# Patient Record
Sex: Male | Born: 1975 | Race: White | Hispanic: No | Marital: Single | Smoking: Never smoker
Health system: Southern US, Community
[De-identification: ages and names within clinical notes are randomized; demographics above are authoritative.]

## PROBLEM LIST (undated history)

## (undated) DIAGNOSIS — Z9889 Other specified postprocedural states: Secondary | ICD-10-CM

## (undated) DIAGNOSIS — R112 Nausea with vomiting, unspecified: Secondary | ICD-10-CM

## (undated) DIAGNOSIS — M1990 Unspecified osteoarthritis, unspecified site: Secondary | ICD-10-CM

## (undated) DIAGNOSIS — J45909 Unspecified asthma, uncomplicated: Secondary | ICD-10-CM

## (undated) DIAGNOSIS — K4090 Unilateral inguinal hernia, without obstruction or gangrene, not specified as recurrent: Secondary | ICD-10-CM

## (undated) DIAGNOSIS — E739 Lactose intolerance, unspecified: Secondary | ICD-10-CM

## (undated) DIAGNOSIS — L708 Other acne: Secondary | ICD-10-CM

## (undated) DIAGNOSIS — N419 Inflammatory disease of prostate, unspecified: Secondary | ICD-10-CM

## (undated) DIAGNOSIS — K589 Irritable bowel syndrome without diarrhea: Secondary | ICD-10-CM

## (undated) HISTORY — DX: Inflammatory disease of prostate, unspecified: N41.9

## (undated) HISTORY — DX: Unilateral inguinal hernia, without obstruction or gangrene, not specified as recurrent: K40.90

## (undated) HISTORY — DX: Irritable bowel syndrome, unspecified: K58.9

## (undated) HISTORY — PX: COLONOSCOPY: SHX174

## (undated) HISTORY — PX: SKIN TAG REMOVAL: SHX780

## (undated) HISTORY — DX: Lactose intolerance, unspecified: E73.9

## (undated) HISTORY — DX: Other acne: L70.8

## (undated) HISTORY — PX: WISDOM TOOTH EXTRACTION: SHX21

## (undated) HISTORY — DX: Unspecified osteoarthritis, unspecified site: M19.90

---

## 1994-01-02 HISTORY — PX: OTHER SURGICAL HISTORY: SHX169

## 2004-10-15 ENCOUNTER — Emergency Department (HOSPITAL_COMMUNITY): Admission: EM | Admit: 2004-10-15 | Discharge: 2004-10-15 | Payer: Self-pay | Admitting: Family Medicine

## 2007-07-09 ENCOUNTER — Emergency Department (HOSPITAL_COMMUNITY): Admission: EM | Admit: 2007-07-09 | Discharge: 2007-07-09 | Payer: Self-pay | Admitting: Emergency Medicine

## 2008-01-03 HISTORY — PX: HERNIA REPAIR: SHX51

## 2008-03-30 ENCOUNTER — Emergency Department (HOSPITAL_COMMUNITY): Admission: EM | Admit: 2008-03-30 | Discharge: 2008-03-30 | Payer: Self-pay | Admitting: Emergency Medicine

## 2008-04-18 ENCOUNTER — Emergency Department (HOSPITAL_COMMUNITY): Admission: EM | Admit: 2008-04-18 | Discharge: 2008-04-18 | Payer: Self-pay | Admitting: Emergency Medicine

## 2008-04-24 ENCOUNTER — Ambulatory Visit: Payer: Self-pay | Admitting: Gastroenterology

## 2008-04-24 DIAGNOSIS — K4090 Unilateral inguinal hernia, without obstruction or gangrene, not specified as recurrent: Secondary | ICD-10-CM | POA: Insufficient documentation

## 2008-04-24 DIAGNOSIS — R197 Diarrhea, unspecified: Secondary | ICD-10-CM

## 2008-04-24 DIAGNOSIS — R1032 Left lower quadrant pain: Secondary | ICD-10-CM | POA: Insufficient documentation

## 2008-04-24 HISTORY — DX: Unilateral inguinal hernia, without obstruction or gangrene, not specified as recurrent: K40.90

## 2008-04-24 LAB — CONVERTED CEMR LAB
ALT: 26 units/L (ref 0–53)
AST: 24 units/L (ref 0–37)
Alkaline Phosphatase: 68 units/L (ref 39–117)
Basophils Absolute: 0 10*3/uL (ref 0.0–0.1)
HCT: 46.1 % (ref 39.0–52.0)
Lymphs Abs: 1.3 10*3/uL (ref 0.7–4.0)
MCV: 89.2 fL (ref 78.0–100.0)
Monocytes Absolute: 0.4 10*3/uL (ref 0.1–1.0)
Platelets: 214 10*3/uL (ref 150.0–400.0)
RDW: 12 % (ref 11.5–14.6)
Sodium: 143 meq/L (ref 135–145)
TSH: 1.14 microintl units/mL (ref 0.35–5.50)
Total Bilirubin: 1.2 mg/dL (ref 0.3–1.2)
Total Protein: 7.9 g/dL (ref 6.0–8.3)

## 2008-05-13 ENCOUNTER — Ambulatory Visit: Payer: Self-pay | Admitting: Gastroenterology

## 2008-05-13 DIAGNOSIS — M549 Dorsalgia, unspecified: Secondary | ICD-10-CM | POA: Insufficient documentation

## 2008-05-15 ENCOUNTER — Ambulatory Visit: Payer: Self-pay | Admitting: Internal Medicine

## 2008-05-15 LAB — CONVERTED CEMR LAB
Bilirubin Urine: NEGATIVE
Glucose, Urine, Semiquant: NEGATIVE
Ketones, ur: NEGATIVE mg/dL
Ketones, urine, test strip: NEGATIVE
Leukocytes, UA: NEGATIVE
Nitrite: NEGATIVE
Protein, U semiquant: NEGATIVE
Specific Gravity, Urine: <1.005
Specific Gravity, Urine: 1.01 (ref 1.000–1.030)
Urobilinogen, UA: 0.2
WBC Urine, dipstick: NEGATIVE
pH: 5
pH: 5 (ref 5.0–8.0)

## 2008-05-18 ENCOUNTER — Encounter (INDEPENDENT_AMBULATORY_CARE_PROVIDER_SITE_OTHER): Payer: Self-pay | Admitting: *Deleted

## 2008-05-18 LAB — CONVERTED CEMR LAB
Chlamydia, Swab/Urine, PCR: NEGATIVE
GC Probe Amp, Urine: NEGATIVE

## 2008-05-19 ENCOUNTER — Ambulatory Visit: Payer: Self-pay | Admitting: Internal Medicine

## 2008-05-26 ENCOUNTER — Ambulatory Visit: Payer: Self-pay | Admitting: Internal Medicine

## 2008-06-11 ENCOUNTER — Encounter: Payer: Self-pay | Admitting: Internal Medicine

## 2008-06-26 ENCOUNTER — Ambulatory Visit (HOSPITAL_BASED_OUTPATIENT_CLINIC_OR_DEPARTMENT_OTHER): Admission: RE | Admit: 2008-06-26 | Discharge: 2008-06-26 | Payer: Self-pay | Admitting: General Surgery

## 2008-12-08 ENCOUNTER — Ambulatory Visit: Payer: Self-pay | Admitting: Internal Medicine

## 2008-12-08 DIAGNOSIS — L708 Other acne: Secondary | ICD-10-CM

## 2009-02-19 ENCOUNTER — Ambulatory Visit: Payer: Self-pay | Admitting: Internal Medicine

## 2009-02-20 DIAGNOSIS — R091 Pleurisy: Secondary | ICD-10-CM

## 2009-04-24 ENCOUNTER — Emergency Department (HOSPITAL_COMMUNITY): Admission: EM | Admit: 2009-04-24 | Discharge: 2009-04-24 | Payer: Self-pay | Admitting: Family Medicine

## 2009-08-30 ENCOUNTER — Ambulatory Visit: Payer: Self-pay | Admitting: Internal Medicine

## 2009-08-31 DIAGNOSIS — N489 Disorder of penis, unspecified: Secondary | ICD-10-CM | POA: Insufficient documentation

## 2009-12-14 ENCOUNTER — Ambulatory Visit: Payer: Self-pay | Admitting: Internal Medicine

## 2009-12-14 DIAGNOSIS — R05 Cough: Secondary | ICD-10-CM

## 2009-12-14 DIAGNOSIS — B370 Candidal stomatitis: Secondary | ICD-10-CM

## 2010-01-11 ENCOUNTER — Ambulatory Visit
Admission: RE | Admit: 2010-01-11 | Discharge: 2010-01-11 | Payer: Self-pay | Source: Home / Self Care | Attending: Internal Medicine | Admitting: Internal Medicine

## 2010-01-12 ENCOUNTER — Other Ambulatory Visit: Payer: Self-pay | Admitting: Internal Medicine

## 2010-01-12 ENCOUNTER — Ambulatory Visit
Admission: RE | Admit: 2010-01-12 | Discharge: 2010-01-12 | Payer: Self-pay | Source: Home / Self Care | Attending: Internal Medicine | Admitting: Internal Medicine

## 2010-01-12 LAB — URINALYSIS, ROUTINE W REFLEX MICROSCOPIC
Bilirubin Urine: NEGATIVE
Ketones, ur: NEGATIVE
Leukocytes, UA: NEGATIVE
Nitrite: NEGATIVE
Specific Gravity, Urine: >=1.03 (ref 1.000–1.030)
Total Protein, Urine: NEGATIVE
Urine Glucose: NEGATIVE
Urobilinogen, UA: 0.2 (ref 0.0–1.0)
pH: 5.5 (ref 5.0–8.0)

## 2010-01-12 LAB — HEPATIC FUNCTION PANEL
ALT: 25 U/L (ref 0–53)
AST: 21 U/L (ref 0–37)
Albumin: 4 g/dL (ref 3.5–5.2)
Alkaline Phosphatase: 67 U/L (ref 39–117)
Bilirubin, Direct: 0.1 mg/dL (ref 0.0–0.3)
Total Bilirubin: 0.8 mg/dL (ref 0.3–1.2)
Total Protein: 6.9 g/dL (ref 6.0–8.3)

## 2010-01-12 LAB — LIPID PANEL
Cholesterol: 187 mg/dL (ref 0–200)
HDL: 31.9 mg/dL — ABNORMAL LOW (ref >39.00)
LDL Cholesterol: 126 mg/dL — ABNORMAL HIGH (ref 0–99)
Total CHOL/HDL Ratio: 6
Triglycerides: 146 mg/dL (ref 0.0–149.0)
VLDL: 29.2 mg/dL (ref 0.0–40.0)

## 2010-01-12 LAB — BASIC METABOLIC PANEL
BUN: 18 mg/dL (ref 6–23)
CO2: 29 mEq/L (ref 19–32)
Calcium: 9.1 mg/dL (ref 8.4–10.5)
Chloride: 104 mEq/L (ref 96–112)
Creatinine, Ser: 0.9 mg/dL (ref 0.4–1.5)
GFR: 105.79 mL/min (ref >60.00)
Glucose, Bld: 95 mg/dL (ref 70–99)
Potassium: 4 mEq/L (ref 3.5–5.1)
Sodium: 141 mEq/L (ref 135–145)

## 2010-01-12 LAB — CBC WITH DIFFERENTIAL/PLATELET
Basophils Absolute: 0 10*3/uL (ref 0.0–0.1)
Basophils Relative: 0.4 % (ref 0.0–3.0)
Eosinophils Absolute: 0.1 10*3/uL (ref 0.0–0.7)
Eosinophils Relative: 1.9 % (ref 0.0–5.0)
HCT: 42.4 % (ref 39.0–52.0)
Hemoglobin: 14.7 g/dL (ref 13.0–17.0)
Lymphocytes Relative: 28.9 % (ref 12.0–46.0)
Lymphs Abs: 1.6 10*3/uL (ref 0.7–4.0)
MCHC: 34.6 g/dL (ref 30.0–36.0)
MCV: 90.9 fl (ref 78.0–100.0)
Monocytes Absolute: 0.6 10*3/uL (ref 0.1–1.0)
Monocytes Relative: 10.3 % (ref 3.0–12.0)
Neutro Abs: 3.2 10*3/uL (ref 1.4–7.7)
Neutrophils Relative %: 58.5 % (ref 43.0–77.0)
Platelets: 196 10*3/uL (ref 150.0–400.0)
RBC: 4.66 Mil/uL (ref 4.22–5.81)
RDW: 13.3 % (ref 11.5–14.6)
WBC: 5.4 10*3/uL (ref 4.5–10.5)

## 2010-01-12 LAB — TSH: TSH: 1.11 u[IU]/mL (ref 0.35–5.50)

## 2010-01-24 ENCOUNTER — Encounter: Payer: Self-pay | Admitting: Internal Medicine

## 2010-02-03 NOTE — Assessment & Plan Note (Signed)
Summary: cpx/bcbs/#/cd   Vital Signs:  Patient profile:   35 year old male Height:      73 inches (185.42 cm) Weight:      167.0 pounds (75.91 kg) O2 Sat:      98 % on Room air Temp:     97.9 degrees F (36.61 degrees C) oral Pulse rate:   67 / minute BP sitting:   112 / 76  (left arm) Cuff size:   regular  Vitals Entered By: Tomma Lightning RMA (January 11, 2010 2:11 PM)  O2 Flow:  Room air CC: CPX Is Patient Diabetic? No Pain Assessment Patient in pain? no        Primary Care Provider:  Rowe Clack MD  CC:  CPX.  History of Present Illness: patient is here today for annual physical. Patient feels well and has no complaints.   Preventive Screening-Counseling & Management  Alcohol-Tobacco     Alcohol drinks/day: <1     Alcohol Counseling: not indicated; use of alcohol is not excessive or problematic     Smoking Status: quit     Tobacco Counseling: not to resume use of tobacco products  Caffeine-Diet-Exercise     Does Patient Exercise: yes     Exercise Counseling: to improve exercise regimen     Depression Counseling: not indicated; screening negative for depression  Safety-Violence-Falls     Seat Belt Counseling: not indicated; patient wears seat belts     Helmet Counseling: not indicated; patient wears helmet when riding bicycle/motocycle     Violence Counseling: not indicated; no violence risk noted     Fall Risk Counseling: not indicated; no significant falls noted  Clinical Review Panels:  Immunizations   Last Tetanus Booster:  Historical (01/02/2006)  CBC   WBC:  4.8 (04/24/2008)   RBC:  5.17 (04/24/2008)   Hgb:  16.2 (04/24/2008)   Hct:  46.1 (04/24/2008)   Platelets:  214.0 (04/24/2008)   MCV  89.2 (04/24/2008)   MCHC  35.1 (04/24/2008)   RDW  12.0 (04/24/2008)   PMN:  62.8 (04/24/2008)   Lymphs:  26.5 (04/24/2008)   Monos:  8.4 (04/24/2008)   Eosinophils:  2.0 (04/24/2008)   Basophil:  0.3 (04/24/2008)  Complete Metabolic Panel  Glucose:  109 (04/24/2008)   Sodium:  143 (04/24/2008)   Potassium:  4.2 (04/24/2008)   Chloride:  107 (04/24/2008)   CO2:  31 (04/24/2008)   BUN:  16 (04/24/2008)   Creatinine:  1.0 (04/24/2008)   Albumin:  4.7 (04/24/2008)   Total Protein:  7.9 (04/24/2008)   Calcium:  9.7 (04/24/2008)   Total Bili:  1.2 (04/24/2008)   Alk Phos:  68 (04/24/2008)   SGPT (ALT):  26 (04/24/2008)   SGOT (AST):  24 (04/24/2008)   Current Medications (verified): 1)  Multivitamins  Tabs (Multiple Vitamin) .... Take 1 Tablet By Mouth Once A Day 2)  Advil 200 Mg Tabs (Ibuprofen) .Marland Kitchen.. 1-2 By Mouth Every 4-6 Hours As Needed For Pain  Allergies (verified): No Known Drug Allergies  Past History:  Past medical, surgical, family and social histories (including risk factors) reviewed, and no changes noted (except as noted below).  Past Medical History: Pneumothorax, spontaneous -age 7 Right inguinal hernia s/p repair summer 2010 Lactose intolerance    Past Surgical History: Reviewed history from 12/08/2008 and no changes required. surgery for spont pneumothorax-Right lung (1996) Inguinal herniorrhaphy, right (2010) - b. thompson  Family History: Reviewed history from 05/15/2008 and no changes required. No FH of Colon  Cancer Family History of Colon Polyps: Father Stroke (grandparents)  Social History: Reviewed history from 12/14/2009 and no changes required. 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 I have reviewed all other systems and they were negative.   Physical Exam  General:  alert, well-developed, well-nourished, and cooperative to examination.    Head:  Normocephalic and atraumatic without obvious  abnormalities. No apparent alopecia or balding. Eyes:  vision grossly intact; pupils equal, round and reactive to light.  conjunctiva and lids normal.    Ears:  normal pinnae bilaterally, without erythema, swelling, or tenderness to palpation. TMs clear, without effusion, or cerumen impaction. Hearing grossly normal bilaterally  Mouth:  teeth and gums in good repair; mucous membranes moist, without lesions or ulcers. oropharynx clear without exudate, no erythema.  Neck:  supple, full ROM, no masses, no thyromegaly; no thyroid nodules or tenderness. no JVD or carotid bruits.   Lungs:  normal respiratory effort, no intercostal retractions or use of accessory muscles; normal breath sounds bilaterally - no crackles and no wheezes.    Heart:  normal rate, regular rhythm, no murmur, and no rub. BLE without edema.  Abdomen:  soft, non-tender, normal bowel sounds, no distention; no masses and no appreciable hepatomegaly or splenomegaly.   Rectal:  defer Genitalia:  defer Prostate:  defer Msk:  No deformity or scoliosis noted of thoracic or lumbar spine.   Neurologic:  alert & oriented X3 and cranial nerves II-XII symetrically intact.  strength normal in all extremities, sensation intact to light touch, and gait normal. speech fluent without dysarthria or aphasia; follows commands with good comprehension.  Skin:  no rashes, vesicles, ulcers, or erythema. No nodules or irregularity to palpation.  Psych:  Oriented X3, memory intact for recent and remote, normally interactive, good eye contact, not anxious appearing, not depressed appearing, and not agitated.      Impression & Recommendations:  Problem # 1:  PREVENTIVE HEALTH CARE (ICD-V70.0) Patient has been counseled on age-appropriate routine health concerns for screening and prevention. These are reviewed and up-to-date. Immunizations are up-to-date or declined. Labs ordered and will be reviewed.  Orders: TLB-Lipid Panel (80061-LIPID) TLB-BMP (Basic  Metabolic Panel-BMET) (39767-HALPFXT) TLB-CBC Platelet - w/Differential (85025-CBCD) TLB-Hepatic/Liver Function Pnl (80076-HEPATIC) TLB-TSH (Thyroid Stimulating Hormone) (84443-TSH) TLB-Udip w/ Micro (81001-URINE)  Problem # 2:  OTHER URINARY PROBLEMS (ICD-V47.4)  Orders: Urology Referral (Urology)  dysuria and hesitation, described, especially after ejaculation/erection prior eval neg for STD and normal UA -  recheck UA with cpx labs and refer to uro now  Complete Medication List: 1)  Multivitamins Tabs (Multiple vitamin) .... Take 1 tablet by mouth once a day 2)  Advil 200 Mg Tabs (Ibuprofen) .Marland Kitchen.. 1-2 by mouth every 4-6 hours as needed for pain  Patient Instructions: 1)  it was good to see you today. 2)  return for lab only when fasting (nothing to eat/drink for 8 hours before lab draw) - your results will be mailed to you after review in 48-72 hours from the time of test completion; if any changes need to be made or there are abnormal results, you will be contacted directly.  3)  try Align or other probiotic for GI digestion  symptoms as discussed 4)  we'll make referral to urology. Our office will contact you regarding this appointment once made.  5)  Please schedule a follow-up appointment annually for medical physical, call sooner if problems.    Orders Added: 1)  Est. Patient 18-39 years [48830] 2)  Urology Referral [Urology] 3)  TLB-Lipid Panel [80061-LIPID] 4)  TLB-BMP (Basic Metabolic Panel-BMET) [14159-RHZJGJG] 5)  TLB-CBC Platelet - w/Differential [85025-CBCD] 6)  TLB-Hepatic/Liver Function Pnl [80076-HEPATIC] 7)  TLB-TSH (Thyroid Stimulating Hormone) [84443-TSH] 8)  TLB-Udip w/ Micro [81001-URINE]   Immunization History:  Tetanus/Td Immunization History:    Tetanus/Td:  historical (01/02/2006)   Immunization History:  Tetanus/Td Immunization History:    Tetanus/Td:  Historical (01/02/2006)

## 2010-02-03 NOTE — Assessment & Plan Note (Signed)
Summary: X 2 WKS NAUSEA-VOMIT'G WK AGO-CONGESTION  STC   Vital Signs:  Patient profile:   35 year old male Height:      73 inches (185.42 cm) Weight:      169.4 pounds (77 kg) BMI:     22.43 O2 Sat:      97 % on Room air Temp:     98.2 degrees F (36.78 degrees C) oral Pulse rate:   97 / minute BP sitting:   122 / 78  (left arm) Cuff size:   regular  Vitals Entered By: Tomma Lightning (February 19, 2009 2:49 PM)  O2 Flow:  Room air CC: congestion, nausea & vomitting x's 1 week Is Patient Diabetic? No Pain Assessment Patient in pain? no        Primary Care Provider:  Rowe Clack MD  CC:  congestion and nausea & vomitting x's 1 week.  History of Present Illness: here today with complaint of chest congestion. onset of symptoms was 1 week ago. course has been sudden onset and now occurs in intermittent waxing/waning pattern. problem precipitated by long plane travel from [place] to [place] . symptom characterized as rattling in lungs and dry cough - problem associated with pain in lungs on deep inspiration L>R but not associated with  LE swelling, fever, green sputum or SOB. symptoms improved by nothing - not taking anything OTC "because i don't want to take the wrong thing". symptoms worsened with coughing - esp at night. +prior hx of same symptoms when experienced spont pneumothx years ago and wants to be checked.  also had N/V when symptoms began 1 week ago - but none in last several days  Current Medications (verified): 1)  Multivitamins  Tabs (Multiple Vitamin) .... Take 1 Tablet By Mouth Once A Day 2)  Advil 200 Mg Tabs (Ibuprofen) .Marland Kitchen.. 1-2 By Mouth Every 4-6 Hours As Needed For Pain  Allergies (verified): No Known Drug Allergies  Past History:  Past Medical History: Pneumothorax, spontaneous -age 65 Right inguinal hernia s/p repair Lactose intolerance  Review of Systems       The patient complains of chest pain and prolonged cough.  The patient denies fever,  hoarseness, dyspnea on exertion, peripheral edema, and headaches.    Physical Exam  General:  alert, well-developed, well-nourished, and cooperative to examination.    Eyes:  vision grossly intact; pupils equal, round and reactive to light.  conjunctiva and lids normal.    Ears:  normal pinnae bilaterally, without erythema, swelling, or tenderness to palpation. TMs clear, without effusion, or cerumen impaction. Hearing grossly normal bilaterally  Mouth:  teeth and gums in good repair; mucous membranes moist, without lesions or ulcers. oropharynx clear without exudate, no erythema.  Neck:  supple, full ROM, no masses, no thyromegaly; no thyroid nodules or tenderness. no JVD or carotid bruits.   Lungs:  normal respiratory effort, no intercostal retractions or use of accessory muscles; normal breath sounds bilaterally - no crackles and no wheezes.    Heart:  normal rate, regular rhythm, no murmur, and no rub. BLE without edema.    Impression & Recommendations:  Problem # 1:  PLEURISY (ICD-511.0)  lung exam clear - VSS and normal O2 - reassurance provided there are no signs of recurrent pneumothoarx - given cough and chest rattle with wax and wane symptoms of congestion over past week, pt provided with Zpack rx to use "in case" - rec use of symptoms tx OTC - options provided to call if symptoms  worse as needed   Complete Medication List: 1)  Multivitamins Tabs (Multiple vitamin) .... Take 1 tablet by mouth once a day 2)  Advil 200 Mg Tabs (Ibuprofen) .Marland Kitchen.. 1-2 by mouth every 4-6 hours as needed for pain 3)  Azithromycin 250 Mg Tabs (Azithromycin) .... 2 tabs by mouth today, then 1 by mouth daily starting tomorrow  Patient Instructions: 1)  it was good to see you today. 2)  zpak antibiotics to use if your symptoms become worse-- fever, sputum, trouble breathing, etc - 3)  in meanwhile, ok to try Advil for pain, Mucinex for sputum and cough, and Tylenol PM or Nyquil for sleep 4)  Please  schedule a follow-up appointment as needed. Prescriptions: AZITHROMYCIN 250 MG TABS (AZITHROMYCIN) 2 tabs by mouth today, then 1 by mouth daily starting tomorrow  #6 x 0   Entered and Authorized by:   Rowe Clack MD   Signed by:   Rowe Clack MD on 02/19/2009   Method used:   Print then Give to Patient   RxID:   [id number]

## 2010-02-03 NOTE — Assessment & Plan Note (Signed)
Summary: red dot on penis getting larger/cd   Vital Signs:  Patient profile:   35 year old male Height:      73 inches (185.42 cm) Weight:      164.8 pounds (74.91 kg) O2 Sat:      98 % on Room air Temp:     98.2 degrees F (36.78 degrees C) oral Pulse rate:   60 / minute BP sitting:   120 / 78  (left arm) Cuff size:   regular  Vitals Entered By: Tomma Lightning RMA (August 30, 2009 3:06 PM)  O2 Flow:  Room air CC: Red bump on penis. Pt states started out as a little bump past 3 weeks getting bigger Is Patient Diabetic? No Pain Assessment Patient in pain? no        Primary Care Aneisa Karren:  Rowe Clack MD  CC:  Red bump on penis. Pt states started out as a little bump past 3 weeks getting bigger.  History of Present Illness: here with c/o penile lesion - onset 3 weeks ago - course has been progressive growth in size, now with smaller 2nd lesion not a/w pain or urethral discharge, no dysuria or hematuria +hx same approx 1 year ago - seen by uro in [place] and treated with lotrisone - resolved not sexually active >3 months, no new partners - no hx STD and denies known risk/exposure no constitutional symptoms: no HA, fever or rash - no painful lymphadenopathy  Current Medications (verified): 1)  Multivitamins  Tabs (Multiple Vitamin) .... Take 1 Tablet By Mouth Once A Day 2)  Advil 200 Mg Tabs (Ibuprofen) .Marland Kitchen.. 1-2 By Mouth Every 4-6 Hours As Needed For Pain  Allergies (verified): No Known Drug Allergies  Past History:  Past Medical History: Pneumothorax, spontaneous -age 38 Right inguinal hernia s/p repair Lactose intolerance  Review of Systems       The patient complains of fever and abdominal pain.  The patient denies hematuria, incontinence, enlarged lymph nodes, and testicular masses.    Physical Exam  General:  alert, well-developed, well-nourished, and cooperative to examination.    Lungs:  normal respiratory effort, no intercostal retractions or use  of accessory muscles; normal breath sounds bilaterally - no crackles and no wheezes.    Heart:  normal rate, regular rhythm, no murmur, and no rub. BLE without edema.  Genitalia:  1cm x 0.5 cm shallow smooth ulceration of superior edge of glans - bright red but dry, nonpainful, and well circumscribed lesion with smaller satelite lesion near primary lesion: red papule 75m round - no urethral dischage - testicles normal Inguinal Nodes:  no inguinal adenopathy.     Impression & Recommendations:  Problem # 1:  PENILE LESION (ICD-236.6)  hx recurrent painless ulceration - systemically nontoxic, no recent sexual partners, no LAD, no urethritis symptoms  prev tx with lotrisone - quick resolution per pt- satellite lesion suggests possible fungal component so will treat again for same - though exam/hx not classic- however pt encouraged to return if not resolving to consider further eval for syphillis, HSV or other infx - may also consider uro eval  Orders: Prescription Created Electronically ([phone]  Complete Medication List: 1)  Multivitamins Tabs (Multiple vitamin) .... Take 1 tablet by mouth once a day 2)  Advil 200 Mg Tabs (Ibuprofen) ..Marland Kitchen. 1-2 by mouth every 4-6 hours as needed for pain 3)  Clotrimazole-betamethasone 1-0.05 % Crea (Clotrimazole-betamethasone) .... Apply to affected skin three times a day - four times a day  x 7-10days, then as needed  Patient Instructions: 1)  it was good to see you today. 2)  use lotrisone cream as discussed - your prescription has been electronically submitted to your pharmacy. Please take as directed. Contact our office if you believe you're having problems with the medication(s). 3)  if not improved in 7-10days, or if worse, call and will make referral to urologist as needed  Prescriptions: CLOTRIMAZOLE-BETAMETHASONE 1-0.05 % CREA (CLOTRIMAZOLE-BETAMETHASONE) apply to affected skin three times a day - four times a day x 7-10days, then as needed  #1 x 1    Entered and Authorized by:   Rowe Clack MD   Signed by:   Rowe Clack MD on 08/30/2009   Method used:   Electronically to        [facility]  [phone]* (retail)       [place], [place]  [address]       Ph: [phone] or [phone]       Fax: [phone]   RxID:   [id number]

## 2010-02-03 NOTE — Assessment & Plan Note (Signed)
Summary: lingering cold/feels better, but does not think completely go...   Vital Signs:  Patient profile:   35 year old male Height:      73 inches (185.42 cm) Weight:      162.8 pounds (74 kg) BMI:     21.56 O2 Sat:      98 % on Room air Temp:     98.2 degrees F (36.78 degrees C) oral Pulse rate:   71 / minute BP sitting:   122 / 80  (left arm) Cuff size:   regular  Vitals Entered By: Tomma Lightning RMA (December 14, 2009 8:34 AM)  O2 Flow:  Room air CC: Cold sxs, URI symptoms Is Patient Diabetic? No Pain Assessment Patient in pain? no        Primary Care Noah Pelaez:  Rowe Clack MD  CC:  Cold sxs and URI symptoms.  History of Present Illness:  URI Symptoms      This is a 35 year old man who presents with URI symptoms.  The symptoms began 3 weeks ago.  The severity is described as moderate.  symptoms wax and wane over past few weeks.  The patient reports nasal congestion, sore throat, and dry cough, but denies clear nasal discharge, purulent nasal discharge, earache, and sick contacts.  Associated symptoms include dyspnea and right side pleurisy.  The patient denies fever, stiff neck, wheezing, rash, vomiting, and diarrhea.  The patient denies itchy watery eyes, itchy throat, sneezing, seasonal symptoms, response to antihistamine, headache, muscle aches, and severe fatigue.  Risk factors for Strep sinusitis include double sickening.  The patient denies the following risk factors for Strep sinusitis: tooth pain, Strep exposure, and tender adenopathy.    Clinical Review Panels:  CBC   WBC:  4.8 (04/24/2008)   RBC:  5.17 (04/24/2008)   Hgb:  16.2 (04/24/2008)   Hct:  46.1 (04/24/2008)   Platelets:  214.0 (04/24/2008)   MCV  89.2 (04/24/2008)   MCHC  35.1 (04/24/2008)   RDW  12.0 (04/24/2008)   PMN:  62.8 (04/24/2008)   Lymphs:  26.5 (04/24/2008)   Monos:  8.4 (04/24/2008)   Eosinophils:  2.0 (04/24/2008)   Basophil:  0.3 (04/24/2008)  Complete Metabolic Panel  Glucose:  109 (04/24/2008)   Sodium:  143 (04/24/2008)   Potassium:  4.2 (04/24/2008)   Chloride:  107 (04/24/2008)   CO2:  31 (04/24/2008)   BUN:  16 (04/24/2008)   Creatinine:  1.0 (04/24/2008)   Albumin:  4.7 (04/24/2008)   Total Protein:  7.9 (04/24/2008)   Calcium:  9.7 (04/24/2008)   Total Bili:  1.2 (04/24/2008)   Alk Phos:  68 (04/24/2008)   SGPT (ALT):  26 (04/24/2008)   SGOT (AST):  24 (04/24/2008)   Current Medications (verified): 1)  Multivitamins  Tabs (Multiple Vitamin) .... Take 1 Tablet By Mouth Once A Day 2)  Advil 200 Mg Tabs (Ibuprofen) .Marland Kitchen.. 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    Physical Exam  General:  alert, well-developed, well-nourished, and cooperative to examination.    Head:  Normocephalic and atraumatic without obvious  abnormalities. No apparent alopecia or balding. Eyes:  vision grossly intact; pupils equal, round and reactive to light.  conjunctiva and lids normal.    Ears:  normal pinnae bilaterally, without erythema, swelling, or tenderness to palpation. TMs clear, without effusion, or cerumen impaction. Hearing grossly normal bilaterally  Mouth:  teeth and gums in good repair; mucous membranes moist, without lesions or ulcers. oropharynx clear without exudate, no erythema. +thrush on tongue Lungs:  normal respiratory effort, no intercostal retractions or use of accessory muscles; normal breath sounds bilaterally - no crackles and no wheezes.    Heart:  normal rate, regular rhythm, no murmur, and no rub. BLE  without edema.    Impression & Recommendations:  Problem # 1:  COUGH (ICD-786.2) exam benign for bact infx - suspect pleurisy check cxr now, esp as pt with concern for prior spont ptx age 70  if no asd, bronchial change or other abn, plan tx NSAIDs x 5 d and antitussive - same explained to pt Orders: T-2 View CXR (71020TC)  Problem # 2:  PLEURISY (ICD-511.0)  Orders: T-2 View CXR (71020TC)  lung exam clear - VSS and normal O2 - see above for cough reassurance provided there are no signs of recurrent pneumothoarx - rec use of symptoms tx OTC - options provided to call if symptoms worse as needed   Problem # 3:  THRUSH (ICD-112.0)  nystatin swish and gargle - erx done  Orders: Prescription Created Electronically [phone]  Complete Medication List: 1)  Multivitamins Tabs (Multiple vitamin) .... Take 1 tablet by mouth once a day 2)  Advil 200 Mg Tabs (Ibuprofen) .Marland Kitchen.. 1-2 by mouth every 4-6 hours as needed for pain 3)  Nystatin 100000 Unit/ml Susp (Nystatin) .Marland Kitchen.. 1 tbsp swish and gargle three times a day x 5 days, then as needed  Patient Instructions: 1)  it was good to see you today. 2)  mouthrinse for mouth symptoms as discussed - your prescriptions have been electronically submitted to your pharmacy. Please take as directed. Contact our office if you believe you're having problems with the medication(s). 3)  chest xray ordered today - your results will be  called to you after review today- further treatment plans will be  determined after review of these results 4)  Please keep scheduled follow-up appointment in Jan for physical and labs, call sooner if problems.  Prescriptions: NYSTATIN 100000 UNIT/ML SUSP (NYSTATIN) 1 tbsp swish and gargle three times a day x 5 days, then as needed  #8 oz x 1   Entered and Authorized by:   Rowe Clack MD   Signed by:   Rowe Clack MD on 12/14/2009   Method used:   Electronically to        [facility]  [phone]*  (retail)       [place], [place]  [address]       Ph: [phone] or [phone]       Fax: [phone]   RxID:   [id number]    Orders Added: 1)  T-2 View CXR [71020TC] 2)  Est. Patient Level IV [85909] 3)  Prescription Created Electronically [phone]

## 2010-02-09 NOTE — Consult Note (Signed)
Summary: [facility]   Imported By: Phillis Knack 01/31/2010 09:35:54  _____________________________________________________________________  External Attachment:    Type:   Image     Comment:   External Document

## 2010-04-13 LAB — POCT URINALYSIS DIP (DEVICE)
Bilirubin Urine: NEGATIVE
Nitrite: NEGATIVE
Protein, ur: NEGATIVE mg/dL
Urobilinogen, UA: 0.2 mg/dL (ref 0.0–1.0)
pH: 7.5 (ref 5.0–8.0)

## 2010-05-17 NOTE — Op Note (Signed)
Curtis [name], Curtis [name]              ACCOUNT NO.:  [id number]   MEDICAL RECORD NO.:  [id number]          PATIENT TYPE:  AMB   LOCATION:  [place]                          FACILITY:  [facility]   PHYSICIAN:  Curtis [name]. Curtis [name], M.D.DATE OF BIRTH:  [birth year]   DATE OF PROCEDURE:  06/26/2008  DATE OF DISCHARGE:                               OPERATIVE REPORT   PREOPERATIVE DIAGNOSIS:  Right inguinal hernia.   POSTOPERATIVE DIAGNOSIS:  Right inguinal hernia.   PROCEDURE:  Repair of right inguinal hernia with mesh.   SURGEON:  Curtis [name]. Curtis Silos, MD   ANESTHESIA:  General with laryngeal mask airway.   HISTORY OF PRESENT ILLNESS:  [name] is a 35 year old white male who  I evaluated in the office for a symptomatic right inguinal hernia.  He  first noticed that about 2 years ago, but it has been getting larger and  intermittently painful.  He presents for elective repair.   PROCEDURE IN DETAIL:  Informed consent was obtained.  The patient was  identified in the preop holding area.  His site was marked.  He received  intravenous antibiotics.  He was brought to the operating room.  General  anesthesia with laryngeal mask airway was administered by the anesthesia  staff.  His abdomen and groins were prepped and draped in sterile  fashion.  Time-out procedure was done.  Area below his right anterior-  superior iliac spine was infiltrated with 0.25% Marcaine, we also  injected this along the planned line of the incision.  Right inguinal  incision was made.  Subcutaneous tissues were dissected down through  Scarpa's fascia revealing the external oblique fascia.  This was  somewhat attenuated medially due to the large hernia.  The external  oblique fascia was divided laterally and then this division was  continued down through the external ring.  The superior leaflet of the  external oblique fascia was dissected off of the transversalis and the  inferior leaflet was dissected down revealing the  shelving edge of the  inguinal ligament.  Cord structures were encircled with Penrose drain.  The floor was inspected.  There was no direct inguinal hernia, however,  the floor was very weak.  The cord was then dissected revealing a very  large indirect hernia sac.  This extended quite a ways medially.  The  contents were reduced manually.  The sac was then dissected free from  the cord structures all the way back down to the internal ring.  The sac  was then opened, it had contained only omentum.  There were a few small  adhesions down near the base and these were taken down.  Excellent  hemostasis was ensured, the omentum was reduced back into the abdomen.  The sac was then divided and high ligated with pursestring 2-0 Vicryl  suture.  The sac was discarded, the head appeared normal.  The hernia  repair was then completed with a keyhole polypropylene mesh cut custom  to size.  The mesh was tacked to the tissues over the pubic tubercle  medially and with 0 Prolene suture, it was then secured to  the shelving  edge of the inguinal ligament with a running 0 Prolene suture.  The mesh  was then tacked superiorly along the tissues to the overlying the pubic  tubercle again with interrupted 0 Prolene sutures.  This also extended  with interrupted 0 Prolene sutures, tacking the mesh along the  transversalis superiorly and extending out laterally.  The 2 leaflets of  the mesh are rejoined behind the cord structures and tacked together  into the underlying musculature with 0 Prolene in interrupted fashion.  The tips of the mesh were tucked laterally under the external oblique.  Please note that during the initial dissection, the inguinal branch of  his ilioinguinal nerve was noted to be in danger becoming entrapped by  the mesh, so it was divided and removed.  The mesh was then copiously  irrigated.  Meticulous hemostasis was ensured.  The aperture in the mesh  admitted the tip of a fifth digit  next to the cord structures.  Cord  structures remained viable and nonedematous.  The external oblique  fascia was then closed with a running 2-0 Vicryl and subcuticular  primary running 2-0 Vicryl suture.  Subcutaneous tissues were irrigated.  Some additional local anesthetic was injected.  Scarpa's fascia was  closed with interrupted 3-0 Vicryl sutures and the skin was closed with  a running 4-0 Monocryl subcuticular stitch.  Followed by Dermabond,  sponge, needle, and instruments were all correct.  The patient's right  testicle was returned to anatomic position at the completion of the  case.  The patient tolerated the procedure well without apparent  complication and was taken to recovery room in stable condition.      Curtis [name] Curtis [name], M.D.  Electronically Signed     BET/MEDQ  D:  06/26/2008  T:  06/27/2008  Job:  [id number]   cc:   Mateo Flow A. Asa Lente, MD

## 2010-06-16 ENCOUNTER — Encounter: Payer: Self-pay | Admitting: Internal Medicine

## 2010-06-18 ENCOUNTER — Ambulatory Visit (INDEPENDENT_AMBULATORY_CARE_PROVIDER_SITE_OTHER): Payer: BC Managed Care – PPO | Admitting: Family Medicine

## 2010-06-18 ENCOUNTER — Encounter: Payer: Self-pay | Admitting: Family Medicine

## 2010-06-18 VITALS — BP 112/70 | HR 68 | Temp 98.2°F | Wt 160.0 lb

## 2010-06-18 DIAGNOSIS — R197 Diarrhea, unspecified: Secondary | ICD-10-CM

## 2010-06-18 MED ORDER — METRONIDAZOLE 500 MG PO TABS: 500.0000 mg | ORAL_TABLET | Freq: Two times a day (BID) | ORAL | Status: DC

## 2010-06-18 MED ORDER — CLOTRIMAZOLE-BETAMETHASONE 1-0.05 % EX CREA: TOPICAL_CREAM | Freq: Two times a day (BID) | CUTANEOUS | Status: DC

## 2010-06-18 NOTE — Assessment & Plan Note (Signed)
Acute for 1 week - after taking antibiotics  Strongly suspect c diff given mucous in stool and persistant symptoms  Cannot do testing today in sat clinic -- but will empirically tx with flagyl Disc hydration If not improved by mid week - follow up with primary If worse - call - esp if abd pain or fever

## 2010-06-18 NOTE — Progress Notes (Signed)
Subjective:    Patient ID: Curtis [name], male    DOB: May 17, 1975, 35 y.o.   MRN: [id number]  HPI Here for stomach issues  Sunday started with some mild nausea and diarrhea  Nausea went away without vomiting  Appetite fine  Diarrhea - has worsened and is persistant  No fever  No abd pain -- but cramp before bm  Was tired all day- slept much of the day  Woke up this am  Has been almost a week   Sun - Monday - very watery stools  Since then a bit more solid but loose  No blood in stool Tried some pepto- no imp  Gassy and some mucous leakage with bad odor   Stools are slowing down more now  Is having bm - about every 4-6 hours - depending on when he eats  No new stress   Hx of constipation -- was once given samples of align  Has been trying for 4 days- ? If helping or hurting   Did just have a dental implant put in and was put on amoxicillin -- finished that on 2nd of June   No out of country travel or camping  Has city water with a pur filter   Also needs a refil of lotrisone for [employer] itch/ penile rash This comes and goes and he ran out of it  Is itchy  Usually improves quickly  Patient Active Problem List  Diagnoses  . PENILE LESION  . PLEURISY  . INGUINAL HERNIA, RIGHT  . ACNE, CYSTIC  . BACK PAIN  . DIARRHEA  . ABDOMINAL PAIN, LEFT LOWER QUADRANT  . THRUSH  . COUGH   Past Medical History  Diagnosis Date  . Abdominal pain, left lower quadrant 04/24/2008  . ACNE, CYSTIC 12/08/2008  . BACK PAIN 05/13/2008  . Cough 12/14/2009  . Diarrhea 04/24/2008  . INGUINAL HERNIA, RIGHT 04/24/2008  . PENILE LESION 08/31/2009  . PLEURISY 02/20/2009  . Lactose intolerance   . Pneumonia    Past Surgical History  Procedure Date  . Surgery for spont pneumothorax 1996    right lung  . Hernia repair 2010    right, Dr. Grandville Silos   History  Substance Use Topics  . Smoking status: Never Smoker   . 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  Alcohol Use: Yes     weekends   Family History  Problem Relation Age of Onset  . Stroke Other     grandparent   No Known Allergies Current Outpatient Prescriptions on File Prior to Visit  Medication Sig Dispense Refill  . ibuprofen (ADVIL,MOTRIN) 200 MG tablet Take 200 mg by mouth every 6 (six) hours as needed.        . Multiple Vitamin (MULTIVITAMIN) tablet Take 1 tablet by mouth daily.              Review of Systems Review of Systems  Constitutional: Negative for fever, appetite change, fatigue and unexpected weight change.  Eyes: Negative for pain and visual disturbance.  Respiratory: Negative for cough and shortness of breath.   Cardiovascular: Negative.  for cp or palpitations Gastrointestinal: Negative for nausea, abd pain - pos for diarrhea/ occ cramping- no blood in stool Genitourinary: Negative for urgency and frequency.  Skin: Negative for pallor. pos for groin rash recurrent  Neurological: Negative for weakness, light-headedness, numbness and headaches.  Hematological: Negative for adenopathy. Does not bruise/bleed easily.  Psychiatric/Behavioral: Negative for dysphoric mood.  The patient is not nervous/anxious.         Objective:   Physical Exam  Constitutional: He appears well-developed and well-nourished. No distress.  HENT:  Head: Normocephalic and atraumatic.  Mouth/Throat: Oropharynx is clear and moist.  Eyes: Conjunctivae and EOM are normal. Pupils are equal, round, and reactive to light. No scleral icterus.  Neck: Normal range of motion. Neck supple. No thyromegaly present.  Cardiovascular: Normal rate, regular rhythm and normal heart sounds.   Pulmonary/Chest: Effort normal and breath sounds normal. No respiratory distress.  Abdominal: Soft. He exhibits no distension and no mass. There is no tenderness.       bs are mildly hyperactive but not high pitched   Musculoskeletal: He exhibits no tenderness.  Lymphadenopathy:    He has no cervical adenopathy.   Neurological: He is alert. He has normal reflexes.  Skin: Skin is warm and dry. No rash noted. No erythema. No pallor.       Brisk capillary refil time  Psychiatric: He has a normal mood and affect.          Assessment & Plan:

## 2010-06-18 NOTE — Patient Instructions (Signed)
I think you may have a C difficile colitis from recent antibiotics  Take the flagyl as directed  Keep hydrated I also refilled lotrisone for rash  If not improved by mid next week or if worse - follow up with your regular doctor

## 2010-06-22 ENCOUNTER — Encounter: Payer: Self-pay | Admitting: Internal Medicine

## 2010-06-22 ENCOUNTER — Ambulatory Visit (INDEPENDENT_AMBULATORY_CARE_PROVIDER_SITE_OTHER): Payer: BC Managed Care – PPO | Admitting: Internal Medicine

## 2010-06-22 DIAGNOSIS — R197 Diarrhea, unspecified: Secondary | ICD-10-CM

## 2010-06-22 DIAGNOSIS — R110 Nausea: Secondary | ICD-10-CM

## 2010-06-22 MED ORDER — PROMETHAZINE HCL 25 MG PO TABS: 25.0000 mg | ORAL_TABLET | Freq: Four times a day (QID) | ORAL | Status: AC | PRN

## 2010-06-22 NOTE — Patient Instructions (Signed)
It was good to see you today. Stop metronidazole antibiotics and use promethazine for nausea as discussed - Your prescription(s) have been submitted to your pharmacy. Please take as directed and contact our office if you believe you are having problem(s) with the medication(s). BRAT diet as discussed next 24-48hours If continued symptoms next 72h despite treatment/rest, call for other testing as discussed, sooner if problems

## 2010-06-22 NOTE — Progress Notes (Signed)
  Subjective:    Patient ID: Curtis [name], male    DOB: 04/10/1975, 35 y.o.   MRN: [id number]  HPI Here for continued stomach problems - Seen Sat urg care for same 6/16 - n/v/d rx'd flagyl and align -  Diarrhea improved but severe nausea - no vomitting  Past Medical History  Diagnosis Date  . ACNE, CYSTIC   . INGUINAL HERNIA, RIGHT 04/24/2008    s/p RIH repair  . PENILE LESION   . Lactose intolerance     Review of Systems  Constitutional: Negative for fever.  Cardiovascular: Negative for chest pain.  Gastrointestinal: Negative for abdominal pain.      Objective:   Physical Exam BP 122/80  Pulse 74  Temp(Src) 97.4 F (36.3 C) (Oral)  Ht 6' 1"  (1.854 m)  Wt 158 lb 6.4 oz (71.85 kg)  BMI 20.90 kg/m2  SpO2 98%  Physical Exam  Constitutional:  oriented to person, place, and time. appears well-developed and well-nourished. No distress. nontoxic Neck: Normal range of motion. Neck supple. No JVD present. No thyromegaly present.  Cardiovascular: Normal rate, regular rhythm and normal heart sounds.  No murmur heard. Pulmonary/Chest: Effort normal and breath sounds normal. No respiratory distress. no wheezes.  Abdominal: Soft. Bowel sounds are normal. Patient exhibits no distension. There is no tenderness.    Lab Results  Component Value Date   WBC 5.4 01/12/2010   HGB 14.7 01/12/2010   HCT 42.4 01/12/2010   PLT 196.0 01/12/2010   CHOL 187 01/12/2010   TRIG 146.0 01/12/2010   HDL 31.90* 01/12/2010   ALT 25 01/12/2010   AST 21 01/12/2010   NA 141 01/12/2010   K 4.0 01/12/2010   CL 104 01/12/2010   CREATININE 0.9 01/12/2010   BUN 18 01/12/2010   CO2 29 01/12/2010   TSH 1.11 01/12/2010       Assessment & Plan:  Diarrhea - improved with flagyl but now with nausea -  Stop flagyl and use promethazine prn for nausea - erx done If continued GI problems in next 2-3 days, pt will call for further eval and tx as needed

## 2010-11-22 ENCOUNTER — Telehealth: Payer: Self-pay | Admitting: *Deleted

## 2010-11-22 DIAGNOSIS — Z0000 Encounter for general adult medical examination without abnormal findings: Secondary | ICD-10-CM

## 2010-11-22 NOTE — Telephone Encounter (Signed)
Received staff msg pt made appt for cpx need labs entered...11/22/10@2 :00pm/LMB

## 2010-12-08 ENCOUNTER — Encounter: Payer: Self-pay | Admitting: Internal Medicine

## 2010-12-16 ENCOUNTER — Encounter: Payer: Self-pay | Admitting: Internal Medicine

## 2011-01-12 ENCOUNTER — Other Ambulatory Visit (INDEPENDENT_AMBULATORY_CARE_PROVIDER_SITE_OTHER): Payer: BC Managed Care – PPO

## 2011-01-12 DIAGNOSIS — Z0000 Encounter for general adult medical examination without abnormal findings: Secondary | ICD-10-CM

## 2011-01-12 LAB — CBC WITH DIFFERENTIAL/PLATELET
Eosinophils Relative: 2.3 % (ref 0.0–5.0)
HCT: 45.1 % (ref 39.0–52.0)
Hemoglobin: 15.4 g/dL (ref 13.0–17.0)
Lymphs Abs: 2.4 10*3/uL (ref 0.7–4.0)
Monocytes Relative: 10.7 % (ref 3.0–12.0)
Neutro Abs: 4.6 10*3/uL (ref 1.4–7.7)
WBC: 8.1 10*3/uL (ref 4.5–10.5)

## 2011-01-12 LAB — TSH: TSH: 1.99 u[IU]/mL (ref 0.35–5.50)

## 2011-01-12 LAB — URINALYSIS, ROUTINE W REFLEX MICROSCOPIC
Ketones, ur: NEGATIVE
Specific Gravity, Urine: >=1.03 (ref 1.000–1.030)
Total Protein, Urine: NEGATIVE
Urine Glucose: NEGATIVE
Urobilinogen, UA: 0.2 (ref 0.0–1.0)

## 2011-01-12 LAB — LIPID PANEL
HDL: 37.8 mg/dL — ABNORMAL LOW (ref >39.00)
LDL Cholesterol: 118 mg/dL — ABNORMAL HIGH (ref 0–99)
Total CHOL/HDL Ratio: 5
VLDL: 22.6 mg/dL (ref 0.0–40.0)

## 2011-01-12 LAB — BASIC METABOLIC PANEL
CO2: 28 mEq/L (ref 19–32)
Calcium: 9.2 mg/dL (ref 8.4–10.5)
Creatinine, Ser: 1.2 mg/dL (ref 0.4–1.5)
Glucose, Bld: 100 mg/dL — ABNORMAL HIGH (ref 70–99)
Sodium: 139 mEq/L (ref 135–145)

## 2011-01-12 LAB — HEPATIC FUNCTION PANEL: Total Bilirubin: 0.9 mg/dL (ref 0.3–1.2)

## 2011-01-18 ENCOUNTER — Encounter: Payer: Self-pay | Admitting: Internal Medicine

## 2011-01-18 ENCOUNTER — Ambulatory Visit (INDEPENDENT_AMBULATORY_CARE_PROVIDER_SITE_OTHER): Payer: BC Managed Care – PPO | Admitting: Internal Medicine

## 2011-01-18 VITALS — BP 120/80 | HR 71 | Temp 97.1°F | Wt 159.1 lb

## 2011-01-18 DIAGNOSIS — R109 Unspecified abdominal pain: Secondary | ICD-10-CM

## 2011-01-18 DIAGNOSIS — K589 Irritable bowel syndrome without diarrhea: Secondary | ICD-10-CM

## 2011-01-18 DIAGNOSIS — R102 Pelvic and perineal pain unspecified side: Secondary | ICD-10-CM

## 2011-01-18 DIAGNOSIS — Z0000 Encounter for general adult medical examination without abnormal findings: Secondary | ICD-10-CM

## 2011-01-18 DIAGNOSIS — R197 Diarrhea, unspecified: Secondary | ICD-10-CM

## 2011-01-18 MED ORDER — PAROXETINE HCL 10 MG PO TABS: 10.0000 mg | ORAL_TABLET | ORAL | Status: DC

## 2011-01-18 NOTE — Progress Notes (Signed)
Subjective:    Patient ID: Curtis [name], male    DOB: Jul 26, 1975, 36 y.o.   MRN: [id number]  HPI  patient is here today for annual physical. Patient feels well overall  Remains concerned about BLQ abdominal pain - Ongoing >2years Describes as bloating and cramping and stabbing pain in BLQ -  Pain associated with diarrhea "loose to explosive", up to 7-8x/day symptoms worse with stress - unsure about impact of food Also rectal discomfort and penile pain with voiding effort - but no dysuria -  Tenderness of scrotum, perineal region and suprapubic region Uro 01/2010 OP eval suggested IBS or pelvic floor syndrome - but pt would like GI opinion on same   Past Medical History  Diagnosis Date  . ACNE, CYSTIC   . INGUINAL HERNIA, RIGHT 04/24/2008    s/p RIH repair  . Lactose intolerance    Family History  Problem Relation Age of Onset  . Stroke Other     grandparent   History  Substance Use Topics  . Smoking status: Never Smoker   . 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 Alcohol Use: Yes     weekends    Review of Systems Constitutional: Negative for fever or weight change.  Respiratory: Negative for cough and shortness of breath.   Cardiovascular: Negative for chest pain or palpitations.  Gastrointestinal: Positive for abdominal pain, frequent diarrhea.  Musculoskeletal: Negative for gait problem or joint swelling.  Skin: Negative for rash.  Neurological: Negative for dizziness or headache.  No other specific complaints in a complete review of systems (except as listed in HPI above).     Objective:   Physical Exam  BP 120/80  Pulse 71  Temp(Src) 97.1 F (36.2 C) (Oral)  Wt 159 lb 1.9 oz (72.176 kg)  SpO2 98% Wt Readings from Last 3 Encounters:  01/18/11 159 lb 1.9 oz (72.176 kg)  06/22/10 158 lb 6.4 oz (71.85 kg)  06/18/10 160 lb (72.576 kg)   Constitutional:  He appears well-developed and well-nourished. No distress. HENT:  NCAT, no sinus tenderness - TMs clear, OP clear Eyes: PERRL, corrective lenses - no jaundice or icterus  Neck: Normal range of motion. Neck supple. No JVD present. No thyromegaly present.  Cardiovascular: Normal rate, regular rhythm and normal heart sounds.  No murmur heard. no BLE edema Pulmonary/Chest: Effort normal and breath sounds normal. No respiratory distress. no wheezes.  Abdominal: Soft. Bowel sounds are normal. Patient exhibits no distension. There is no tenderness.  Musculoskeletal: Normal range of motion. Patient exhibits no edema.  GU - defer to uro and GI Neurological: he is alert and oriented to person, place, and time. No cranial nerve deficit. Coordination normal.  Skin: Skin is warm and dry.  No erythema or ulceration.  Psychiatric: he has a normal mood and affect. behavior is normal. Judgment and thought content normal.     Lab Results  Component Value Date   WBC 8.1 01/12/2011   HGB 15.4 01/12/2011   HCT 45.1 01/12/2011   PLT 207.0 01/12/2011   CHOL 178 01/12/2011   TRIG 113.0 01/12/2011   HDL 37.80* 01/12/2011   ALT 38 01/12/2011   AST 24 01/12/2011   NA 139 01/12/2011   K 3.7 01/12/2011   CL 103 01/12/2011   CREATININE 1.2 01/12/2011   BUN 21 01/12/2011   CO2 28 01/12/2011   TSH 1.99 01/12/2011       Assessment & Plan:   CPX -  v70.0 - Patient has been counseled on age-appropriate routine health concerns for screening and prevention. These are reviewed and up-to-date. Immunizations are up-to-date or declined. Labs reviewed.  Chronic abdominal pain and diarrhea - suspect IBS -  symptoms also associated with pelvic pain and defecation/urination concerns -  prior evaluation by uro 01/2010 for same> no uro problem -  ongoing pain symptoms preceeding R-IH repair 04/2008 -  reviewed normal labs and neg uro eval with pt today -  will start low dose paxil and refer to GI for re-eval of other possible GI problems (last seen by [facility] prior to [facility] dx/repair 2010)

## 2011-01-18 NOTE — Patient Instructions (Addendum)
It was good to see you today. We have reviewed your prior records including labs and tests today Health Maintenance reviewed - everything is up to date! Start paxil low dose daily for IBS symptoms and anxiety symptoms - Your prescription(s) have been submitted to your pharmacy. Please take as directed and contact our office if you believe you are having problem(s) with the medication(s). we'll make referral to gastroenterology for pain and diarrhea symptoms. Our office will contact you regarding appointment(s) once made. Please schedule followup in 3-4 months to continue review of symptoms and medications, call sooner if problems.   Irritable Bowel Syndrome Irritable Bowel Syndrome (IBS) is caused by a disturbance of normal bowel function. Other terms used are spastic colon, mucous colitis, and irritable colon. It does not require surgery, nor does it lead to cancer. There is no cure for IBS. But with proper diet, stress reduction, and medication, you will find that your problems (symptoms) will gradually disappear or improve. IBS is a common digestive disorder. It usually appears in late adolescence or early adulthood. Women develop it twice as often as men. CAUSES   After food has been digested and absorbed in the small intestine, waste material is moved into the colon (large intestine). In the colon, water and salts are absorbed from the undigested products coming from the small intestine. The remaining residue, or fecal material, is held for elimination. Under normal circumstances, gentle, rhythmic contractions on the bowel walls push the fecal material along the colon towards the rectum. In IBS, however, these contractions are irregular and poorly coordinated. The fecal material is either retained too long, resulting in constipation, or expelled too soon, producing diarrhea. SYMPTOMS   The most common symptom of IBS is pain. It is typically in the lower left side of the belly (abdomen). But it may  occur anywhere in the abdomen. It can be felt as heartburn, backache, or even as a dull pain in the arms or shoulders. The pain comes from excessive bowel-muscle spasms and from the buildup of gas and fecal material in the colon. This pain:  Can range from sharp belly (abdominal) cramps to a dull, continuous ache.     Usually worsens soon after eating.     Is typically relieved by having a bowel movement or passing gas.  Abdominal pain is usually accompanied by constipation. But it may also produce diarrhea. The diarrhea typically occurs right after a meal or upon arising in the morning. The stools are typically soft and watery. They are often flecked with secretions (mucus). Other symptoms of IBS include:  Bloating.     Loss of appetite.     Heartburn.    Feeling sick to your stomach (nausea).     Belching    Vomiting    Gas.  IBS may also cause a number of symptoms that are unrelated to the digestive system:  Fatigue.     Headaches.    Anxiety    Shortness of breath     Difficulty in concentrating.     Dizziness.  These symptoms tend to come and go. DIAGNOSIS   The symptoms of IBS closely mimic the symptoms of other, more serious digestive disorders. So your caregiver may wish to perform a variety of additional tests to exclude these disorders. He/she wants to be certain of learning what is wrong (diagnosis). The nature and purpose of each test will be explained to you. TREATMENT A number of medications are available to help correct bowel function and/or  relieve bowel spasms and abdominal pain. Among the drugs available are:  Mild, non-irritating laxatives for severe constipation and to help restore normal bowel habits.     Specific anti-diarrheal medications to treat severe or prolonged diarrhea.     Anti-spasmodic agents to relieve intestinal cramps.     Your caregiver may also decide to treat you with a mild tranquilizer or sedative during unusually stressful  periods in your life.  The important thing to remember is that if any drug is prescribed for you, make sure that you take it exactly as directed. Make sure that your caregiver knows how well it worked for you. HOME CARE INSTRUCTIONS    Avoid foods that are high in fat or oils. Some examples RPR:XYVOP cream, butter, frankfurters, sausage, and other fatty meats.     Avoid foods that have a laxative effect, such as fruit, fruit juice, and dairy products.     Cut out carbonated drinks, chewing gum, and "gassy" foods, such as beans and cabbage. This may help relieve bloating and belching.     Bran taken with plenty of liquids may help relieve constipation.     Keep track of what foods seem to trigger your symptoms.     Avoid emotionally charged situations or circumstances that produce anxiety.     Start or continue exercising.     Get plenty of rest and sleep.  MAKE SURE YOU:    Understand these instructions.     Will watch your condition.     Will get help right away if you are not doing well or get worse.  Document Released: 12/19/2004 Document Revised: 08/31/2010 Document Reviewed: 08/09/2007 [facility] Patient Information 2012 [place].

## 2011-01-30 ENCOUNTER — Ambulatory Visit: Payer: BC Managed Care – PPO | Admitting: Gastroenterology

## 2011-11-08 ENCOUNTER — Telehealth: Payer: Self-pay | Admitting: Gastroenterology

## 2011-11-08 ENCOUNTER — Telehealth: Payer: Self-pay | Admitting: *Deleted

## 2011-11-08 ENCOUNTER — Encounter: Payer: Self-pay | Admitting: Gastroenterology

## 2011-11-08 DIAGNOSIS — Z0000 Encounter for general adult medical examination without abnormal findings: Secondary | ICD-10-CM

## 2011-11-08 NOTE — Telephone Encounter (Signed)
Message copied by Earnstine Regal on Wed Nov 08, 2011  2:44 PM ------      Message from: Sherral Hammers      Created: Wed Nov 08, 2011  2:32 PM      Regarding: LAB       PHYSICAL LABS IN [place]

## 2011-11-08 NOTE — Telephone Encounter (Signed)
Left message for patient to call back  

## 2011-11-08 NOTE — Telephone Encounter (Signed)
Received staff msg pt made cpx for January. Entering cpx labs...Curtis [name]

## 2011-11-10 NOTE — Telephone Encounter (Signed)
A person returned the cal and stated that the number listed in the phone note is incorrect.  I have left a message for him on his emergency contact number

## 2011-11-13 NOTE — Telephone Encounter (Signed)
I have left another message for the patient to please call back if we can help him.  As of today no return call from the patient

## 2011-12-04 ENCOUNTER — Ambulatory Visit: Payer: BC Managed Care – PPO | Admitting: Gastroenterology

## 2011-12-05 ENCOUNTER — Ambulatory Visit (INDEPENDENT_AMBULATORY_CARE_PROVIDER_SITE_OTHER): Payer: BC Managed Care – PPO | Admitting: Internal Medicine

## 2011-12-05 ENCOUNTER — Encounter: Payer: Self-pay | Admitting: Internal Medicine

## 2011-12-05 VITALS — BP 122/80 | HR 75 | Temp 97.4°F | Ht 73.0 in | Wt 158.0 lb

## 2011-12-05 DIAGNOSIS — R05 Cough: Secondary | ICD-10-CM

## 2011-12-05 DIAGNOSIS — J069 Acute upper respiratory infection, unspecified: Secondary | ICD-10-CM

## 2011-12-05 DIAGNOSIS — Z23 Encounter for immunization: Secondary | ICD-10-CM

## 2011-12-05 MED ORDER — HYDROCODONE-HOMATROPINE 5-1.5 MG/5ML PO SYRP: 5.0000 mL | ORAL_SOLUTION | Freq: Three times a day (TID) | ORAL | Status: DC | PRN

## 2011-12-05 NOTE — Progress Notes (Signed)
Subjective:    Patient ID: Curtis [name], male    DOB: Jul 27, 1975, 36 y.o.   MRN: [id number]  HPI  Pt presents to the clinic today with c/o ear fullness that started 2 weeks ago. He went to [facility] on thanksgiving day where they told him he had fluid behind his eardrum and gave him Flonase. He has also had a productive cough with thin yellow sputum. The cough is worse at night but he does feel like everything is getting better. He denies fever, chills, nausea, vomiting or body aches.  Review of Systems      Past Medical History  Diagnosis Date  . ACNE, CYSTIC   . INGUINAL HERNIA, RIGHT 04/24/2008    s/p RIH repair  . Lactose intolerance     Current Outpatient Prescriptions  Medication Sig Dispense Refill  . clotrimazole-betamethasone (LOTRISONE) cream Apply topically 2 (two) times daily. As needed for rash  30 g  0  . ibuprofen (ADVIL,MOTRIN) 200 MG tablet Take 200 mg by mouth every 6 (six) hours as needed.        . Multiple Vitamin (MULTIVITAMIN) tablet Take 1 tablet by mouth daily.        Marland Kitchen PARoxetine (PAXIL) 10 MG tablet Take 1 tablet (10 mg total) by mouth every morning.  30 tablet  6    No Known Allergies  Family History  Problem Relation Age of Onset  . Stroke Other     grandparent    History   Social History  . Marital Status: Single    Spouse Name: N/A    Number of Children: N/A  . Years of Education: N/A   Occupational History  . Not on file.   Social History Main Topics  . Smoking status: Never Smoker   . 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 Alcohol Use: Yes     Comment: weekends  . Drug Use: Yes    Special: Marijuana     Comment: MJ-occassional  . Sexually Active: Not on file   Other Topics Concern  . Not on file   Social History Narrative  . No narrative on file     Constitutional: Denies fever, malaise, fatigue, headache or abrupt weight changes.  HEENT: Pt reports ear fullness and sore throat. Denies  eye pain, eye redness, ringing in the ears, wax buildup, runny nose, nasal congestion or bloody nose. Respiratory: Pt reports cough with thin yellow sputum production. Denies difficulty breathing or shortness of breath.   Neurological: Denies dizziness, difficulty with memory, difficulty with speech or problems with balance and coordination.   No other specific complaints in a complete review of systems (except as listed in HPI above).  Objective:   Physical Exam   BP 122/80  Pulse 75  Temp 97.4 F (36.3 C) (Oral)  Ht 6' 1"  (1.854 m)  Wt 158 lb (71.668 kg)  BMI 20.85 kg/m2  SpO2 96% Wt Readings from Last 3 Encounters:  12/05/11 158 lb (71.668 kg)  01/18/11 159 lb 1.9 oz (72.176 kg)  06/22/10 158 lb 6.4 oz (71.85 kg)    General: Appears his stated age, well developed, well nourished in NAD. HEENT: Head: normal shape and size; Eyes: sclera white, no icterus, conjunctiva pink, PERRLA and EOMs intact; Ears: Tm's gray and intact, normal light reflex; Nose: mucosa pink and moist, septum midline; Throat/Mouth: Teeth present, mucosa erythematous and moist, no exudate, lesions or ulcerations noted.  Neck: Normal range of  motion. Neck supple, trachea midline. No massses, lumps or thyromegaly present.  Cardiovascular: Normal rate and rhythm. S1,S2 noted.  No murmur, rubs or gallops noted. No JVD or BLE edema. No carotid bruits noted. Pulmonary/Chest: Normal effort and positive vesicular breath sounds. No respiratory distress. No wheezes, rales or ronchi noted.        Assessment & Plan:   Cough, likely due to URI, new onset with additional workup required:  Continue taking flonase as prescribed Hycodan cough suppressant for bedtime Stay well hydrated and get plenty of rest  RTC as needed or if symptoms persist

## 2012-01-22 ENCOUNTER — Encounter: Payer: BC Managed Care – PPO | Admitting: Internal Medicine

## 2012-01-25 ENCOUNTER — Ambulatory Visit (INDEPENDENT_AMBULATORY_CARE_PROVIDER_SITE_OTHER): Payer: BC Managed Care – PPO

## 2012-01-25 DIAGNOSIS — Z0000 Encounter for general adult medical examination without abnormal findings: Secondary | ICD-10-CM

## 2012-01-25 DIAGNOSIS — R7309 Other abnormal glucose: Secondary | ICD-10-CM

## 2012-01-25 LAB — LIPID PANEL
Cholesterol: 178 mg/dL (ref 0–200)
HDL: 31.6 mg/dL — ABNORMAL LOW (ref >39.00)
LDL Cholesterol: 116 mg/dL — ABNORMAL HIGH (ref 0–99)
VLDL: 30.8 mg/dL (ref 0.0–40.0)

## 2012-01-25 LAB — URINALYSIS, ROUTINE W REFLEX MICROSCOPIC
Bilirubin Urine: NEGATIVE
Nitrite: NEGATIVE
Total Protein, Urine: NEGATIVE
Urine Glucose: NEGATIVE
pH: 5.5 (ref 5.0–8.0)

## 2012-01-25 LAB — HEPATIC FUNCTION PANEL
ALT: 40 U/L (ref 0–53)
AST: 31 U/L (ref 0–37)
Bilirubin, Direct: 0 mg/dL (ref 0.0–0.3)
Total Bilirubin: 0.9 mg/dL (ref 0.3–1.2)

## 2012-01-25 LAB — BASIC METABOLIC PANEL
BUN: 17 mg/dL (ref 6–23)
Calcium: 9.3 mg/dL (ref 8.4–10.5)
Creatinine, Ser: 1 mg/dL (ref 0.4–1.5)
GFR: 86.08 mL/min (ref >60.00)
Potassium: 3.7 mEq/L (ref 3.5–5.1)

## 2012-01-25 LAB — TSH: TSH: 1.14 u[IU]/mL (ref 0.35–5.50)

## 2012-01-25 LAB — CBC WITH DIFFERENTIAL/PLATELET
Basophils Absolute: 0 10*3/uL (ref 0.0–0.1)
Eosinophils Relative: 1.7 % (ref 0.0–5.0)
Lymphocytes Relative: 27 % (ref 12.0–46.0)
Monocytes Relative: 10 % (ref 3.0–12.0)
Neutrophils Relative %: 61.1 % (ref 43.0–77.0)
Platelets: 212 10*3/uL (ref 150.0–400.0)
RDW: 13.3 % (ref 11.5–14.6)
WBC: 7.8 10*3/uL (ref 4.5–10.5)

## 2012-01-30 ENCOUNTER — Encounter: Payer: Self-pay | Admitting: Internal Medicine

## 2012-01-30 ENCOUNTER — Ambulatory Visit (INDEPENDENT_AMBULATORY_CARE_PROVIDER_SITE_OTHER): Payer: BC Managed Care – PPO | Admitting: Internal Medicine

## 2012-01-30 VITALS — BP 122/70 | HR 78 | Temp 97.0°F | Ht 73.0 in | Wt 160.0 lb

## 2012-01-30 DIAGNOSIS — Z0000 Encounter for general adult medical examination without abnormal findings: Secondary | ICD-10-CM

## 2012-01-30 NOTE — Progress Notes (Signed)
  Subjective:    Patient ID: Curtis [name], male    DOB: [birth year], 37 y.o.   MRN: [id number]  HPI  patient is here today for annual physical. Patient feels well overall   Past Medical History  Diagnosis Date  . ACNE, CYSTIC   . INGUINAL HERNIA, RIGHT 04/24/2008    s/p RIH repair  . Lactose intolerance   . IBS (irritable bowel syndrome)    Family History  Problem Relation Age of Onset  . Stroke Other     grandparent   History  Substance Use Topics  . Smoking status: Never Smoker   . 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 Alcohol Use: Yes     Comment: weekends    Review of Systems  Constitutional: Negative for fever or weight change.  Respiratory: Negative for cough and shortness of breath.   Cardiovascular: Negative for chest pain or palpitations.  Gastrointestinal: Mild chronic flares IBS-D. Otherwise no abdominal pain, nausea and vomiting.  Musculoskeletal: Negative for gait problem or joint swelling.  Skin: Negative for rash.  Neurological: Negative for dizziness or headache.  No other specific complaints in a complete review of systems (except as listed in HPI above).     Objective:   Physical Exam  BP 122/70  Pulse 78  Temp 97 F (36.1 C) (Oral)  Ht 6' 1"  (1.854 m)  Wt 160 lb (72.576 kg)  BMI 21.11 kg/m2  SpO2 97% Wt Readings from Last 3 Encounters:  01/30/12 160 lb (72.576 kg)  12/05/11 158 lb (71.668 kg)  01/18/11 159 lb 1.9 oz (72.176 kg)   Constitutional:  He appears well-developed and well-nourished. No distress. HENT: NCAT, no sinus tenderness - TMs clear, OP clear. Teeth in good repair Eyes: Corrected vision grossly intact. PERRL, EOMI. corrective lenses - no jaundice or icterus  Neck: Normal range of motion. Neck supple. No JVD or LAD present. No thyromegaly present.  Cardiovascular: Normal rate, regular rhythm and normal heart sounds.  No murmur heard. no BLE edema Pulmonary/Chest: Effort normal and  breath sounds normal. No respiratory distress. no wheezes.  Abdominal: Soft. Bowel sounds are normal. Patient exhibits no distension. There is no tenderness.  Musculoskeletal: Normal range of motion. Patient exhibits no gross deformities or joint effusions.  GU - defer to uro and GI Neurological: he is alert and oriented to person, place, and time. No cranial nerve deficit. Coordination normal.  Skin: Ecchymosis right shin 2.2 cm no complicating features. Remaining skin is warm and dry.  No erythema or ulceration.  Psychiatric: he has a normal mood and affect. behavior is normal. Judgment and thought content normal.     Lab Results  Component Value Date   WBC 7.8 01/25/2012   HGB 15.1 01/25/2012   HCT 44.9 01/25/2012   PLT 212.0 01/25/2012   CHOL 178 01/25/2012   TRIG 154.0* 01/25/2012   HDL 31.60* 01/25/2012   ALT 40 01/25/2012   AST 31 01/25/2012   NA 140 01/25/2012   K 3.7 01/25/2012   CL 102 01/25/2012   CREATININE 1.0 01/25/2012   BUN 17 01/25/2012   CO2 31 01/25/2012   TSH 1.14 01/25/2012   HGBA1C 5.6 01/25/2012       Assessment & Plan:   CPX - v70.0 - Patient has been counseled on age-appropriate routine health concerns for screening and prevention. These are reviewed and up-to-date. Immunizations are up-to-date or declined. Labs reviewed.

## 2012-01-30 NOTE — Patient Instructions (Signed)
It was good to see you today. Health Maintenance reviewed - all recommended immunizations and age-appropriate screenings are up-to-date. We have reviewed your prior records including labs and tests today Please schedule followup in 1 year for medical physical and labs, call sooner if problems.   Health Maintenance, Males A healthy lifestyle and preventative care can promote health and wellness.  Maintain regular health, dental, and eye exams.   Eat a healthy diet. Foods like vegetables, fruits, whole grains, low-fat dairy products, and lean protein foods contain the nutrients you need without too many calories. Decrease your intake of foods high in solid fats, added sugars, and salt. Get information about a proper diet from your caregiver, if necessary.   Regular physical exercise is one of the most important things you can do for your health. Most adults should get at least 150 minutes of moderate-intensity exercise (any activity that increases your heart rate and causes you to sweat) each week. In addition, most adults need muscle-strengthening exercises on 2 or more days a week.     Maintain a healthy weight. The body mass index (BMI) is a screening tool to identify possible weight problems. It provides an estimate of body fat based on height and weight. Your caregiver can help determine your BMI, and can help you achieve or maintain a healthy weight. For adults 20 years and older:   A BMI below 18.5 is considered underweight.   A BMI of 18.5 to 24.9 is normal.   A BMI of 25 to 29.9 is considered overweight.   A BMI of 30 and above is considered obese.   Maintain normal blood lipids and cholesterol by exercising and minimizing your intake of saturated fat. Eat a balanced diet with plenty of fruits and vegetables. Blood tests for lipids and cholesterol should begin at age 63 and be repeated every 5 years. If your lipid or cholesterol levels are high, you are over 50, or you are a high risk  for heart disease, you may need your cholesterol levels checked more frequently. Ongoing high lipid and cholesterol levels should be treated with medicines, if diet and exercise are not effective.   If you smoke, find out from your caregiver how to quit. If you do not use tobacco, do not start.   If you choose to drink alcohol, do not exceed 2 drinks per day. One drink is considered to be 12 ounces (355 mL) of beer, 5 ounces (148 mL) of wine, or 1.5 ounces (44 mL) of liquor.   Avoid use of street drugs. Do not share needles with anyone. Ask for help if you need support or instructions about stopping the use of drugs.   High blood pressure causes heart disease and increases the risk of stroke. Blood pressure should be checked at least every 1 to 2 years. Ongoing high blood pressure should be treated with medicines if weight loss and exercise are not effective.   If you are 24 to 38 years old, ask your caregiver if you should take aspirin to prevent heart disease.   Diabetes screening involves taking a blood sample to check your fasting blood sugar level. This should be done once every 3 years, after age 22, if you are within normal weight and without risk factors for diabetes. Testing should be considered at a younger age or be carried out more frequently if you are overweight and have at least 1 risk factor for diabetes.   Colorectal cancer can be detected and often prevented.  Most routine colorectal cancer screening begins at the age of 4 and continues through age 62. However, your caregiver may recommend screening at an earlier age if you have risk factors for colon cancer. On a yearly basis, your caregiver may provide home test kits to check for hidden blood in the stool. Use of a small camera at the end of a tube, to directly examine the colon (sigmoidoscopy or colonoscopy), can detect the earliest forms of colorectal cancer. Talk to your caregiver about this at age 44, when routine screening  begins. Direct examination of the colon should be repeated every 5 to 10 years through age 76, unless early forms of pre-cancerous polyps or small growths are found.   Hepatitis C blood testing is recommended for all people born from [birth year] through 1966 and any individual with known risks for hepatitis C.   Healthy men should no longer receive prostate-specific antigen (PSA) blood tests as part of routine cancer screening. Consult with your caregiver about prostate cancer screening.   Testicular cancer screening is not recommended for adolescents or adult males who have no symptoms. Screening includes self-exam, caregiver exam, and other screening tests. Consult with your caregiver about any symptoms you have or any concerns you have about testicular cancer.   Practice safe sex. Use condoms and avoid high-risk sexual practices to reduce the spread of sexually transmitted infections (STIs).   Use sunscreen with a sun protection factor (SPF) of 30 or greater. Apply sunscreen liberally and repeatedly throughout the day. You should seek shade when your shadow is shorter than you. Protect yourself by wearing long sleeves, pants, a wide-brimmed hat, and sunglasses year round, whenever you are outdoors.   Notify your caregiver of new moles or changes in moles, especially if there is a change in shape or color. Also notify your caregiver if a mole is larger than the size of a pencil eraser.   A one-time screening for abdominal aortic aneurysm (AAA) and surgical repair of large AAAs by sound wave imaging (ultrasonography) is recommended for ages 48 to 34 years who are current or former smokers.   Stay current with your immunizations.  Document Released: 06/17/2007 Document Revised: 03/13/2011 Document Reviewed: 05/16/2010 [facility] Patient Information 2013 [employer].

## 2012-06-20 IMAGING — CR DG CHEST 2V
3 series · 3 of 3 positions shown · non-contrast
Comparison: May 15, 2008

CLINICAL DATA: Cough and shortness of breath

CHEST - 2 VIEW

[view not recorded (1 of 3)]
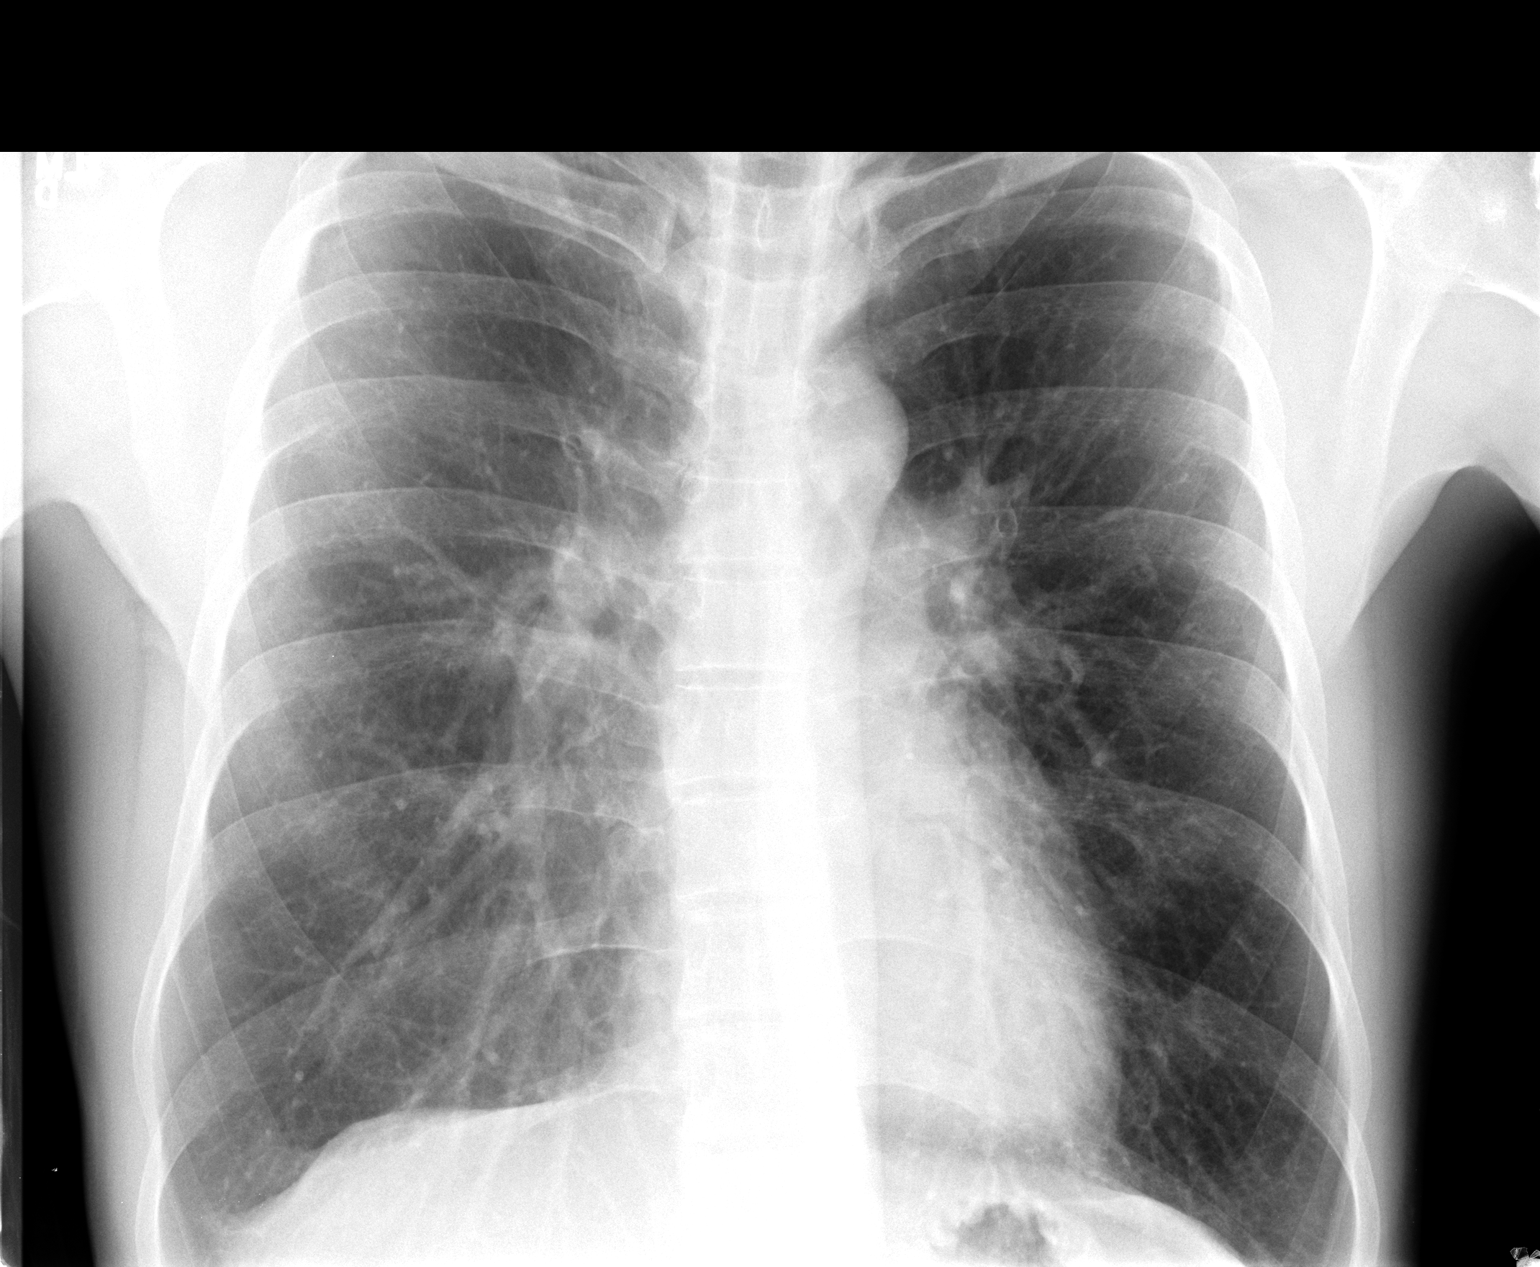

[view not recorded (2 of 3)]
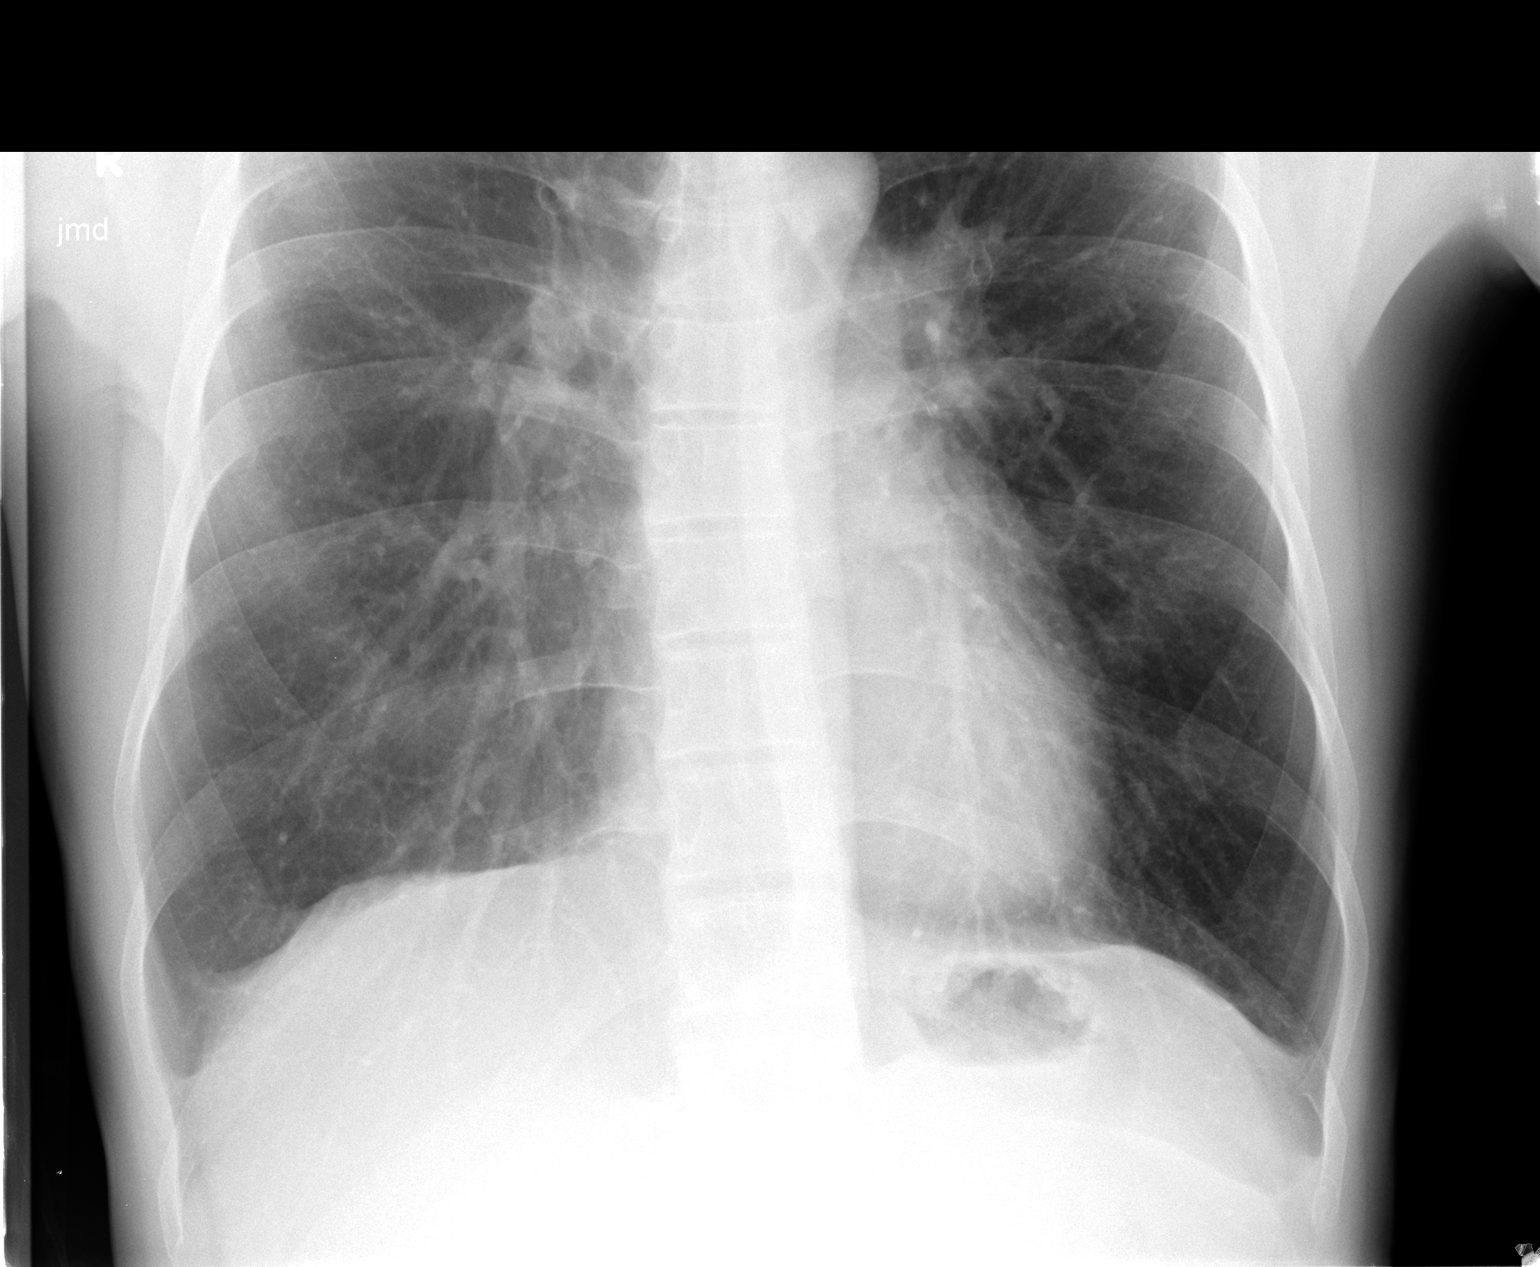

[view not recorded (3 of 3)]
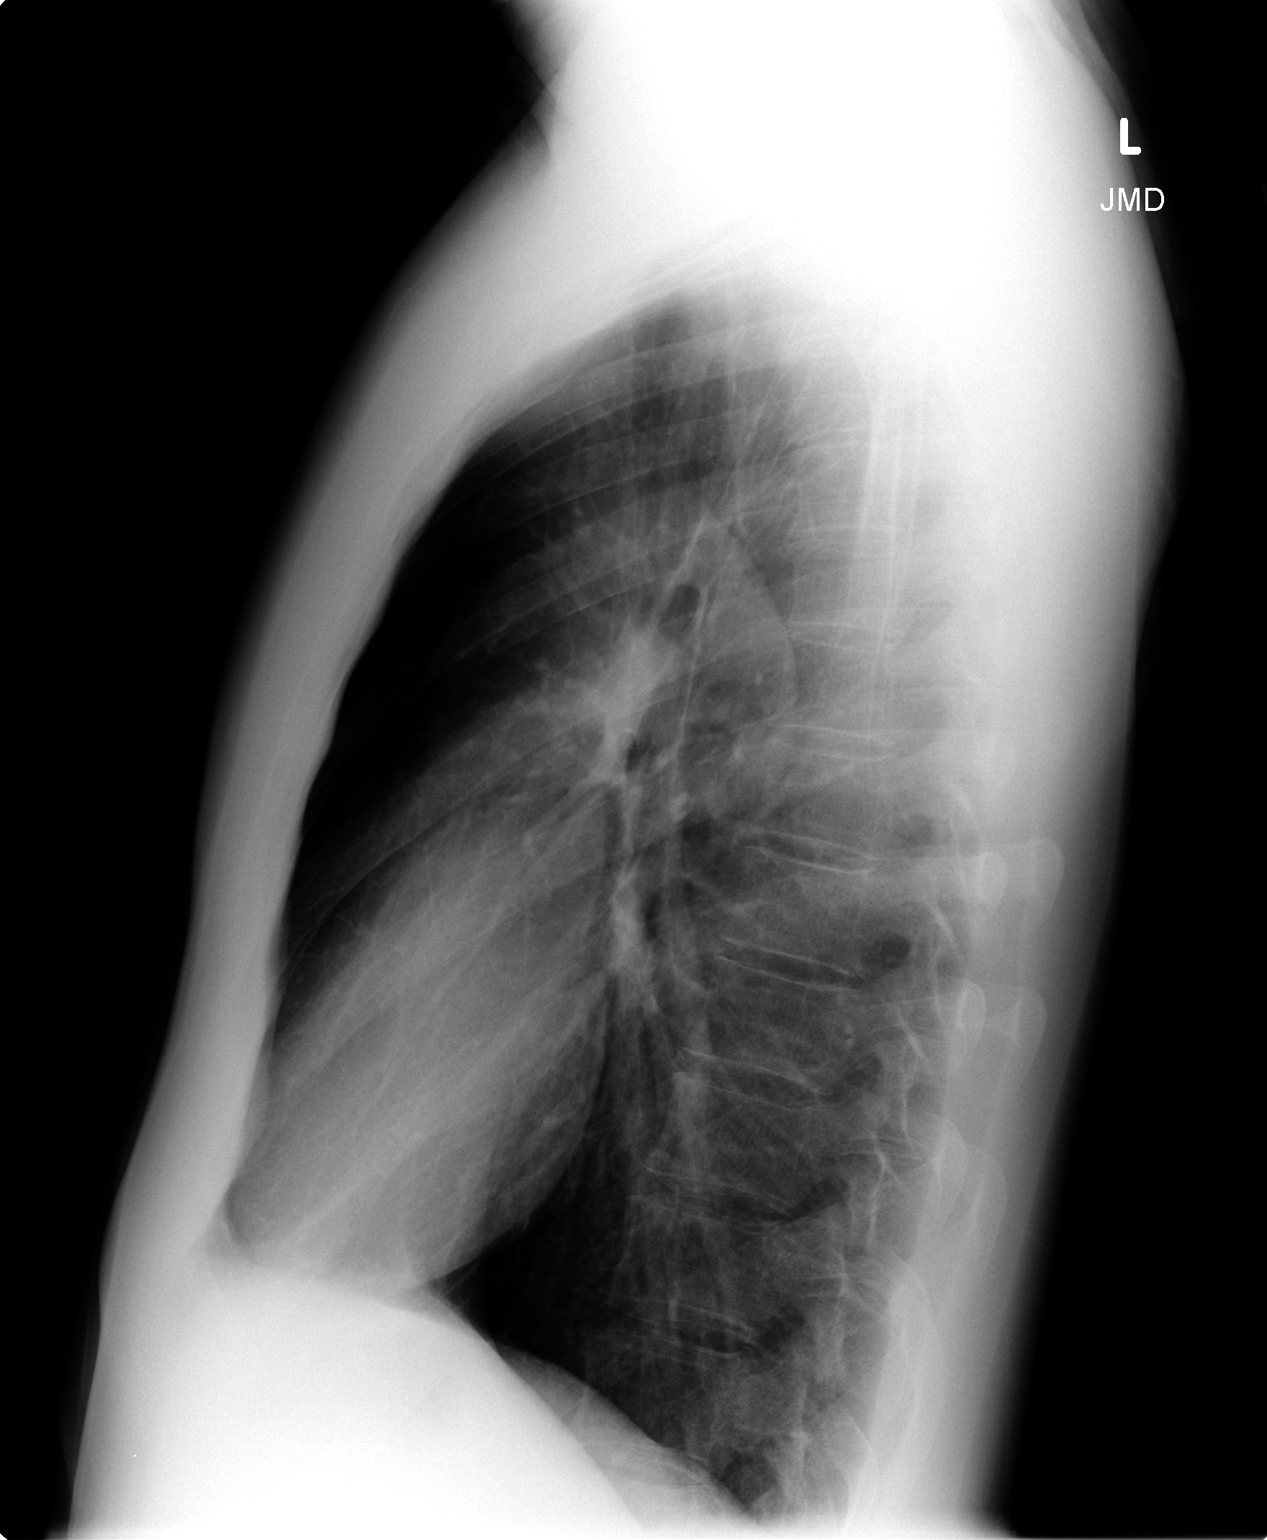

[3 of 3 positions shown; findings below may reference images not displayed]

FINDINGS: The cardiac silhouette, mediastinum, pulmonary
vasculature are within normal limits.  Both lungs are clear.  There
are stable postsurgical changes versus pleural calcification at the
right apex.   There is no acute bony abnormality.
IMPRESSION: There is no evidence of acute cardiac or pulmonary process.

## 2013-03-03 ENCOUNTER — Encounter: Payer: Self-pay | Admitting: Gastroenterology

## 2013-03-05 ENCOUNTER — Encounter: Payer: Self-pay | Admitting: Internal Medicine

## 2013-03-05 ENCOUNTER — Ambulatory Visit (INDEPENDENT_AMBULATORY_CARE_PROVIDER_SITE_OTHER): Payer: BC Managed Care – PPO | Admitting: Internal Medicine

## 2013-03-05 VITALS — BP 118/60 | HR 77 | Temp 97.8°F | Wt 161.8 lb

## 2013-03-05 DIAGNOSIS — L309 Dermatitis, unspecified: Secondary | ICD-10-CM

## 2013-03-05 DIAGNOSIS — L259 Unspecified contact dermatitis, unspecified cause: Secondary | ICD-10-CM

## 2013-03-05 MED ORDER — MOMETASONE FUROATE 0.1 % EX OINT: TOPICAL_OINTMENT | Freq: Two times a day (BID) | CUTANEOUS | Status: DC

## 2013-03-05 NOTE — Progress Notes (Signed)
   Subjective:    Patient ID: Curtis [name], male    DOB: 1975/07/30, 38 y.o.   MRN: [id number]  HPI Presents with complaint of skin lesion on anterior aspect of R lower leg; reports it has lingered for last year and a half; in last 2 months noticed erythema at the site.  He tried [employer] Healing lotion for a couple weeks; stopped that 1 week ago due to increased irritation noted. He has seen no other areas like that on his body.   Review of Systems Denies fever, chills, sweats, new medications, skin or laundry products, environmental factors, discharge, pruritis, URI symptoms, joint swelling, changes in skin, hair or nails.      Objective:   Physical Exam General appearance : thin but in good health and nourishment w/o distress.  Eyes: No conjunctival inflammation or scleral icterus is present.  Oral exam: Dental hygiene is good; lips and gums are healthy appearing.There is no significant oropharyngeal erythema or exudate noted.  Heart: Normal rate and regular rhythm. S1 and S2 normal without gallop, murmur, click, rub or other extra sounds  Lungs:Chest clear to auscultation; no wheezes, rhonchi,rales ,or rubs present.No increased work of breathing.  Scar on R pleural area from prior spontaneous pneumothorax  Abdomen: bowel sounds normal, soft and non-tender without masses, organomegaly or hernias noted.  Musculoskeletal: Able to lie flat and sit up without help.  Gait normal.  Skin:Warm & dry. Intact; bland hyperpigmented area 2 cm x 2cm inferior to  3 x 4 cm slightly  erythematous area anteriorly on R shin; no other skin lesions; no jaundice or tenting. Normal hair skin distribution on legs.   All pulses intact without  bruits .No ischemic skin changes.  Lymphatic: No lymphadenopathy is noted about the head, neck, axilla    Assessment & Plan:  #1 plaque dermatitis See orders

## 2013-03-05 NOTE — Patient Instructions (Signed)
Use ointment  twice a day  for the dry skin. Bathe with moisturizing liquid soap , not bar soap.

## 2013-03-05 NOTE — Progress Notes (Signed)
   Subjective:    Patient ID: Curtis [name], male    DOB: [birth year], 38 y.o.   MRN: [id number]  HPI Presents with complaint of skin lesion on anterior aspect of R lower leg; reports it has lingered for last year and a half; in last 2 months noticed erythema at the site.  He tried [employer] Healing lotion for a couple weeks; stopped that 1 week ago due to increased irritation noted. He has seen no other areas like that on his body.    Review of Systems Denies fever, chills, sweats, new medications, skin or laundry products, environmental factors, discharge, pruritis, URI symptoms, joint swelling, changes in skin, hair or nails.     Objective:   Physical Exam General appearance is one of good health and nourishment w/o distress.  Eyes: No conjunctival inflammation or scleral icterus is present.  Oral exam: Dental hygiene is good; lips and gums are healthy appearing.There is mild oropharyngeal erythema or exudate noted.   Heart:  Normal rate and regular rhythm. S1 and S2 normal without gallop, murmur, click, rub or other extra sounds     Lungs:Chest clear to auscultation; no wheezes, rhonchi,rales ,or rubs present.No increased work of breathing.  Scar on R pleural area from prior spontaneous pneumothorax   Abdomen: bowel sounds normal, soft and non-tender without masses, organomegaly or hernias noted.  No guarding or rebound . No tenderness over the flanks to percussion  Musculoskeletal: Able to lie flat and sit up without help. Negative straight leg raising bilaterally. Gait normal.  Skin:Warm & dry.  Intact; 3.5 x 3.5 cm erythematous area on R shin; no other skin lesions; no jaundice or tenting.  Lymphatic: No lymphadenopathy is noted about the head, neck, axilla, or inguinal areas.      Assessment & Plan:  #1 excoriation on hematoma?  Bactrim ointment?

## 2013-03-05 NOTE — Progress Notes (Signed)
Pre visit review using our clinic review tool, if applicable. No additional management support is needed unless otherwise documented below in the visit note. 

## 2013-03-07 ENCOUNTER — Other Ambulatory Visit (INDEPENDENT_AMBULATORY_CARE_PROVIDER_SITE_OTHER): Payer: BC Managed Care – PPO

## 2013-03-07 ENCOUNTER — Encounter: Payer: Self-pay | Admitting: Gastroenterology

## 2013-03-07 ENCOUNTER — Ambulatory Visit (INDEPENDENT_AMBULATORY_CARE_PROVIDER_SITE_OTHER): Payer: BC Managed Care – PPO | Admitting: Gastroenterology

## 2013-03-07 VITALS — BP 120/70 | HR 60 | Ht 73.0 in | Wt 159.0 lb

## 2013-03-07 DIAGNOSIS — K589 Irritable bowel syndrome without diarrhea: Secondary | ICD-10-CM

## 2013-03-07 DIAGNOSIS — R198 Other specified symptoms and signs involving the digestive system and abdomen: Secondary | ICD-10-CM

## 2013-03-07 DIAGNOSIS — R194 Change in bowel habit: Secondary | ICD-10-CM

## 2013-03-07 DIAGNOSIS — K6289 Other specified diseases of anus and rectum: Secondary | ICD-10-CM

## 2013-03-07 LAB — CBC WITH DIFFERENTIAL/PLATELET
BASOS ABS: 0 10*3/uL (ref 0.0–0.1)
Basophils Relative: 0.3 % (ref 0.0–3.0)
EOS PCT: 0.9 % (ref 0.0–5.0)
Eosinophils Absolute: 0.1 10*3/uL (ref 0.0–0.7)
HEMATOCRIT: 45.7 % (ref 39.0–52.0)
HEMOGLOBIN: 15.4 g/dL (ref 13.0–17.0)
LYMPHS ABS: 1.6 10*3/uL (ref 0.7–4.0)
Lymphocytes Relative: 26.5 % (ref 12.0–46.0)
MCHC: 33.6 g/dL (ref 30.0–36.0)
MCV: 90.8 fl (ref 78.0–100.0)
MONOS PCT: 10.1 % (ref 3.0–12.0)
Monocytes Absolute: 0.6 10*3/uL (ref 0.1–1.0)
NEUTROS ABS: 3.8 10*3/uL (ref 1.4–7.7)
Neutrophils Relative %: 62.2 % (ref 43.0–77.0)
Platelets: 219 10*3/uL (ref 150.0–400.0)
RBC: 5.03 Mil/uL (ref 4.22–5.81)
RDW: 13.5 % (ref 11.5–14.6)
WBC: 6.1 10*3/uL (ref 4.5–10.5)

## 2013-03-07 LAB — URINALYSIS
Bilirubin Urine: NEGATIVE
Ketones, ur: NEGATIVE
Leukocytes, UA: NEGATIVE
Nitrite: NEGATIVE
SPECIFIC GRAVITY, URINE: 1.02 (ref 1.000–1.030)
TOTAL PROTEIN, URINE-UPE24: NEGATIVE
Urine Glucose: NEGATIVE
Urobilinogen, UA: 0.2 (ref 0.0–1.0)
pH: 6.5 (ref 5.0–8.0)

## 2013-03-07 LAB — COMPREHENSIVE METABOLIC PANEL
ALT: 33 U/L (ref 0–53)
AST: 23 U/L (ref 0–37)
Albumin: 4.5 g/dL (ref 3.5–5.2)
Alkaline Phosphatase: 61 U/L (ref 39–117)
BILIRUBIN TOTAL: 1.3 mg/dL — AB (ref 0.3–1.2)
BUN: 16 mg/dL (ref 6–23)
CO2: 31 meq/L (ref 19–32)
CREATININE: 1 mg/dL (ref 0.4–1.5)
Calcium: 9.4 mg/dL (ref 8.4–10.5)
Chloride: 102 mEq/L (ref 96–112)
GFR: 90.63 mL/min (ref >60.00)
GLUCOSE: 97 mg/dL (ref 70–99)
Potassium: 3.9 mEq/L (ref 3.5–5.1)
Sodium: 139 mEq/L (ref 135–145)
Total Protein: 7.4 g/dL (ref 6.0–8.3)

## 2013-03-07 LAB — TSH: TSH: 1.06 u[IU]/mL (ref 0.35–5.50)

## 2013-03-07 MED ORDER — MOVIPREP 100 G PO SOLR: 1.0000 | Freq: Once | ORAL | Status: DC

## 2013-03-07 NOTE — Patient Instructions (Signed)
You have been scheduled for a colonoscopy with propofol. Please follow written instructions given to you at your visit today.  Please pick up your prep kit at the pharmacy within the next 1-3 days. If you use inhalers (even only as needed), please bring them with you on the day of your procedure. Your physician has requested that you go to www.startemmi.com and enter the access code given to you at your visit today. This web site gives a general overview about your procedure. However, you should still follow specific instructions given to you by our office regarding your preparation for the procedure.  Your physician has requested that you go to the basement for the following lab work before leaving today: CBC CMET TSH Urinalysis ______________________________________________________________________________________________________________________________________  Curtis [name] have been scheduled for a CT scan of the abdomen and pelvis at [facility] ([address]---this is in the same building as [facility]).   You are scheduled on 03-11-2013 at 1030. You should arrive 15 minutes prior to your appointment time for registration. Please follow the written instructions below on the day of your exam:  WARNING: IF YOU ARE ALLERGIC TO IODINE/X-RAY DYE, PLEASE NOTIFY RADIOLOGY IMMEDIATELY AT [phone]! YOU WILL BE GIVEN A 13 HOUR PREMEDICATION PREP.  1) Do not eat or drink anything after 630 am (4 hours prior to your test) 2) You have been given 2 bottles of oral contrast to drink. The solution may taste better if refrigerated, but do NOT add ice or any other liquid to this solution. Shake well before drinking.    Drink 1 bottle of contrast @ 830 am (2 hours prior to your exam)  Drink 1 bottle of contrast @ 930 am (1 hour prior to your exam)  You may take any medications as prescribed with a small amount of water except for the following: Metformin, Glucophage, Glucovance,  Avandamet, Riomet, Fortamet, Actoplus Met, Janumet, Glumetza or Metaglip. The above medications must be held the day of the exam AND 48 hours after the exam.  The purpose of you drinking the oral contrast is to aid in the visualization of your intestinal tract. The contrast solution may cause some diarrhea. Before your exam is started, you will be given a small amount of fluid to drink. Depending on your individual set of symptoms, you may also receive an intravenous injection of x-ray contrast/dye. Plan on being at [facility] for 30 minutes or long, depending on the type of exam you are having performed.  This test typically takes 30-45 minutes to complete.  If you have any questions regarding your exam or if you need to reschedule, you may call the CT department at [phone] between the hours of 8:00 am and 5:00 pm, Monday-Friday.  ________________________________________________________________________

## 2013-03-10 DIAGNOSIS — R194 Change in bowel habit: Secondary | ICD-10-CM | POA: Insufficient documentation

## 2013-03-10 DIAGNOSIS — K589 Irritable bowel syndrome without diarrhea: Secondary | ICD-10-CM | POA: Insufficient documentation

## 2013-03-10 DIAGNOSIS — K6289 Other specified diseases of anus and rectum: Secondary | ICD-10-CM | POA: Insufficient documentation

## 2013-03-10 NOTE — Progress Notes (Signed)
03/10/2013 Curtis [name] [id number] [birth year]   HISTORY OF PRESENT ILLNESS:  This is a pleasant, but very anxious 38 year old male who is known to Dr. Fuller Plan for treatment of presumed IBS.  He has been referred back here by his PCP, Dr. Asa Lente.  Patient states that he has always had issues with his bowels and stomach, but the past year or so things have definitely worsened.  He says that he is awoken in the mornings by pain in his perineum and the urge to move his bowels.  Has pain in the perineum with urination and ejaculation as well, but says that he has seen urology in the past.  Also has sensation of incomplete evacuation of his bowels and always feels like he has to move his bowels.  Says that he feels like there is a golf ball in his rectum.  Denies seeing blood in his stools.  Alternates between constipation and "loose stools".  Has some diffuse abdominal pains as well.  Has some nausea but no vomiting.  No weight loss.  He had a CT scan in 05/2008 at which time it showed only a right fat-containing inguinal hernia.     Past Medical History  Diagnosis Date  . ACNE, CYSTIC   . INGUINAL HERNIA, RIGHT 04/24/2008    s/p RIH repair  . Lactose intolerance   . IBS (irritable bowel syndrome)   . Arthritis     spine, early onset   Past Surgical History  Procedure Laterality Date  . Surgery for spont pneumothorax  1996    right lung  . Hernia repair  2010    right, Dr. Grandville Silos    reports that he has never smoked. He has never used smokeless tobacco. He reports that he drinks alcohol. He reports that he uses illicit drugs (Marijuana). family history includes Colon polyps in his father; Stroke in his maternal grandmother; Urolithiasis in his father. There is no history of Colon cancer. No Known Allergies    Outpatient Encounter Prescriptions as of 03/07/2013  Medication Sig  . ibuprofen (ADVIL,MOTRIN) 200 MG tablet Take 200 mg by mouth every 6 (six) hours as needed.    . mometasone  (ELOCON) 0.1 % ointment Apply topically 2 (two) times daily.  . [DISCONTINUED] Multiple Vitamin (MULTIVITAMIN) tablet Take 1 tablet by mouth daily.    Marland Kitchen MOVIPREP 100 G SOLR Take 1 kit (200 g total) by mouth once.     REVIEW OF SYSTEMS  : All other systems reviewed and negative except where noted in the History of Present Illness.   PHYSICAL EXAM: BP 120/70  Pulse 60  Ht 6' 1"  (1.854 m)  Wt 159 lb (72.122 kg)  BMI 20.98 kg/m2 General: Well developed white male in no acute distress, but very anxious Head: Normocephalic and atraumatic Eyes:  Sclerae anicteric, conjunctiva pink. Ears: Normal auditory acuity. Lungs: Clear throughout to auscultation Heart: Regular rate and rhythm Abdomen: Soft, non-distended.  Normal bowel sounds.  Mild diffuse TTP without R/R/G. Rectal:  No external abnormalities noted, including on the perineum.  No tenderness of the perineum.  No masses or tenderness on rectal exam; no pain with palpation of the prostate. Musculoskeletal: Symmetrical with no gross deformities  Skin: No lesions on visible extremities Extremities: No edema  Neurological: Alert oriented x 4, grossly non-focal. Psychological:  Alert and cooperative. Normal mood and affect  ASSESSMENT AND PLAN: -38 year old male with several GI complaints including change in bowels with alternating constipation and diarrhea,  rectal pain/perineal pain, and abdominal pain.  Was presumed to have IBS in the past, but never colonoscopy.  I think with worsening symptoms that colonoscopy is not unreasonable.  Will schedule with Dr. Fuller Plan.  The risks, benefits, and alternatives were discussed with the patient and he consents to proceed.  Will also check some basic labs including CBC, CMP, TSH, and urinalysis.  Will check CT scan of the abdomen and pelvis due to complaints of pain with urination, BM, and ejaculation.  Has seen urology in the past, but may need to return there as well.

## 2013-03-10 NOTE — Progress Notes (Signed)
Reviewed and agree with management plan. He needs Urology evaluation.  Pricilla Riffle. Fuller Plan, MD [facility]

## 2013-03-11 ENCOUNTER — Encounter: Payer: Self-pay | Admitting: Gastroenterology

## 2013-03-11 ENCOUNTER — Ambulatory Visit (INDEPENDENT_AMBULATORY_CARE_PROVIDER_SITE_OTHER)
Admission: RE | Admit: 2013-03-11 | Discharge: 2013-03-11 | Disposition: A | Discharge status: Home / Self Care | Payer: BC Managed Care – PPO | Source: Ambulatory Visit | Attending: Gastroenterology | Admitting: Gastroenterology

## 2013-03-11 DIAGNOSIS — K589 Irritable bowel syndrome without diarrhea: Secondary | ICD-10-CM

## 2013-03-11 MED ORDER — IOHEXOL 300 MG/ML  SOLN
100.0000 mL | Freq: Once | INTRAMUSCULAR | Status: AC | PRN
  Administered 2013-03-11: 100 mL via INTRAVENOUS

## 2013-04-03 ENCOUNTER — Ambulatory Visit (INDEPENDENT_AMBULATORY_CARE_PROVIDER_SITE_OTHER): Payer: BC Managed Care – PPO | Admitting: Family Medicine

## 2013-04-03 ENCOUNTER — Encounter: Payer: Self-pay | Admitting: Family Medicine

## 2013-04-03 VITALS — BP 132/80 | HR 72 | Wt 161.0 lb

## 2013-04-03 DIAGNOSIS — Z113 Encounter for screening for infections with a predominantly sexual mode of transmission: Secondary | ICD-10-CM

## 2013-04-03 DIAGNOSIS — R102 Pelvic and perineal pain: Secondary | ICD-10-CM

## 2013-04-03 DIAGNOSIS — R300 Dysuria: Secondary | ICD-10-CM

## 2013-04-03 DIAGNOSIS — R109 Unspecified abdominal pain: Secondary | ICD-10-CM

## 2013-04-03 LAB — POCT URINALYSIS DIPSTICK
BILIRUBIN UA: NEGATIVE
Glucose, UA: NEGATIVE
Ketones, UA: NEGATIVE
LEUKOCYTES UA: NEGATIVE
Nitrite, UA: NEGATIVE
Protein, UA: NEGATIVE
Spec Grav, UA: 1.02
Urobilinogen, UA: 0.2
pH, UA: 5.5

## 2013-04-03 NOTE — Progress Notes (Signed)
Subjective:    Patient ID: Curtis [name], male    DOB: 04-Oct-1975, 38 y.o.   MRN: [id number]  Groin Pain The patient's pertinent negatives include no penile pain. Associated symptoms include abdominal pain, diarrhea and dysuria. Pertinent negatives include no chest pain, chills, constipation, fever, nausea, rash, shortness of breath or vomiting.   The patient is seen with the following issues as a work in:  He's had five-year history of somewhat nondescript suprapubic pain. He describes a dull ache to a stabbing pain which is very poorly localized. Pain frequently right inguinal region and left lower abdominal region. He relates history of right inguinal hernia surgery about 5 years ago. He has history of reported IBS has had intermittent diarrhea off and on for years. He's had fairly extensive workup both with GI and urology. He has colonoscopy pending. Recent CT abdomen and pelvis unremarkable. He had several recent labs which were unremarkable  He also complains of some perianal inflammation and discomfort. He's had some diarrhea as above but no recent constipation. No reported bloody stools. No appetite or weight changes.  Patient also requesting STD screening. He's not been sexually active in a few months. Denies any penile discharge. Occasional burning with urination. No penile or skin rashes. Denies history of STD  Past Medical History  Diagnosis Date  . ACNE, CYSTIC   . INGUINAL HERNIA, RIGHT 04/24/2008    s/p RIH repair  . Lactose intolerance   . IBS (irritable bowel syndrome)   . Arthritis     spine, early onset   Past Surgical History  Procedure Laterality Date  . Surgery for spont pneumothorax  1996    right lung  . Hernia repair  2010    right, Dr. Grandville Silos    reports that he has never smoked. He has never used smokeless tobacco. He reports that he drinks alcohol. He reports that he uses illicit drugs (Marijuana). family history includes Colon polyps in his  father; Stroke in his maternal grandmother; Urolithiasis in his father. There is no history of Colon cancer. No Known Allergies    Review of Systems  Constitutional: Negative for fever and chills.  Respiratory: Negative for shortness of breath.   Cardiovascular: Negative for chest pain.  Gastrointestinal: Positive for abdominal pain and diarrhea. Negative for nausea, vomiting, constipation, blood in stool and abdominal distention.  Genitourinary: Positive for dysuria. Negative for hematuria and penile pain.  Skin: Negative for rash.  Neurological: Negative for dizziness.  Hematological: Negative for adenopathy.       Objective:   Physical Exam  Constitutional: He appears well-developed and well-nourished.  Cardiovascular: Normal rate and regular rhythm.   Pulmonary/Chest: Effort normal and breath sounds normal. No respiratory distress. He has no wheezes. He has no rales. He exhibits no tenderness.  Abdominal: Soft. Bowel sounds are normal. He exhibits no distension and no mass. There is no tenderness. There is no rebound and no guarding.  Genitourinary:  Patient has small skin tag around 3:00 position but no evidence for acute thrombosis and no evidence for active bleeding. No visible anal fissure  Musculoskeletal: He exhibits no edema.          Assessment & Plan:  #1 dysuria. Urine dipstick reveals 1+ blood otherwise negative. Send urine micro- #2 STD screening. Patient requesting STD screening, though no specific exposures. Check HIV, RPR, urine for chlamydia and GC #3 long history of intermittent suprapubic discomfort, perineal discomfort, and intermittent diarrhea. Workup in progress including scheduled colonoscopy

## 2013-04-03 NOTE — Progress Notes (Signed)
Pre visit review using our clinic review tool, if applicable. No additional management support is needed unless otherwise documented below in the visit note. 

## 2013-04-04 LAB — RPR

## 2013-04-04 LAB — URINALYSIS, MICROSCOPIC ONLY
Bacteria, UA: NONE SEEN
CASTS: NONE SEEN
Crystals: NONE SEEN
Squamous Epithelial / LPF: NONE SEEN

## 2013-04-04 LAB — HIV ANTIBODY (ROUTINE TESTING W REFLEX): HIV: NONREACTIVE

## 2013-04-04 LAB — GC/CHLAMYDIA PROBE AMP
CT PROBE, AMP APTIMA: NEGATIVE
GC Probe RNA: NEGATIVE

## 2013-04-29 ENCOUNTER — Ambulatory Visit (AMBULATORY_SURGERY_CENTER): Payer: BC Managed Care – PPO | Admitting: Gastroenterology

## 2013-04-29 ENCOUNTER — Encounter: Payer: Self-pay | Admitting: Gastroenterology

## 2013-04-29 VITALS — BP 122/78 | HR 57 | Temp 96.9°F | Resp 12 | Ht 73.0 in | Wt 159.0 lb

## 2013-04-29 DIAGNOSIS — K589 Irritable bowel syndrome without diarrhea: Secondary | ICD-10-CM

## 2013-04-29 DIAGNOSIS — R198 Other specified symptoms and signs involving the digestive system and abdomen: Secondary | ICD-10-CM

## 2013-04-29 DIAGNOSIS — R194 Change in bowel habit: Secondary | ICD-10-CM

## 2013-04-29 MED ORDER — GLYCOPYRROLATE 2 MG PO TABS: 1.0000 mg | ORAL_TABLET | Freq: Two times a day (BID) | ORAL | Status: DC

## 2013-04-29 MED ORDER — SODIUM CHLORIDE 0.9 % IV SOLN: 500.0000 mL | INTRAVENOUS | Status: DC

## 2013-04-29 NOTE — Op Note (Signed)
[place]  [address] [place], [address]   COLONOSCOPY PROCEDURE REPORT  PATIENT: Curtis [name], Curtis [name].  III  MR#: [id number] BIRTHDATE: Aug 08, 1975 , 37  yrs. old GENDER: Male ENDOSCOPIST: Ladene Artist, MD, [facility] REFERRED [id number] Asa Lente, M.D. PROCEDURE DATE:  04/29/2013 PROCEDURE:   Colonoscopy, diagnostic First Screening Colonoscopy - Avg.  risk and is 50 yrs.  old or older - No.  Prior Negative Screening - Now for repeat screening. N/A  History of Adenoma - Now for follow-up colonoscopy & has been > or = to 3 yrs.  N/A  Polyps Removed Today? No.  Recommend repeat exam, <10 yrs? No. ASA CLASS:   Class II INDICATIONS:Change in bowel habits and Rectal pain,. MEDICATIONS: MAC sedation, administered by CRNA and propofol (Diprivan) 381m IV  DESCRIPTION OF PROCEDURE:   After the risks benefits and alternatives of the procedure were thoroughly explained, informed consent was obtained.  A digital rectal exam revealed no abnormalities of the rectum.   The LB [id number] endoscope was introduced through the anus and advanced to the terminal ileum which was intubated for a short distance. No adverse events experienced.   The quality of the prep was excellent, using MoviPrep  The instrument was then slowly withdrawn as the colon was fully examined.  COLON FINDINGS: A normal appearing cecum, ileocecal valve, and appendiceal orifice were identified.  The ascending, hepatic flexure, transverse, splenic flexure, descending, sigmoid colon and rectum appeared unremarkable.  No polyps or cancers were seen. The mucosa appeared normal in the terminal ileum.  Retroflexed views revealed no abnormalities. The time to cecum=2 minutes 42 seconds.  Withdrawal time=11 minutes 35 seconds.  The scope was withdrawn and the procedure completed.  COMPLICATIONS: There were no complications.  ENDOSCOPIC IMPRESSION: 1.   Normal colon 2.   Normal mucosa in the terminal  ileum  RECOMMENDATIONS: 1.  You should continue to follow colorectal cancer screening guidelines for "routine risk" patients with a repeat colonoscopy at age 38 2.  glycopyrrolate 1 mg po bid, 1 year of erefills  eSigned:  MLadene Artist MD, FHonorhealth [facility] Center04/28/2015 3:33 PM

## 2013-04-29 NOTE — Patient Instructions (Signed)
Discharge instructions given with verbal understanding. Normal exam. Resume previous medications. Prescription sent in to pharmacy. YOU HAD AN ENDOSCOPIC PROCEDURE TODAY AT [facility]: Refer to the procedure report that was given to you for any specific questions about what was found during the examination.  If the procedure report does not answer your questions, please call your gastroenterologist to clarify.  If you requested that your care partner not be given the details of your procedure findings, then the procedure report has been included in a sealed envelope for you to review at your convenience later.  YOU SHOULD EXPECT: Some feelings of bloating in the abdomen. Passage of more gas than usual.  Walking can help get rid of the air that was put into your GI tract during the procedure and reduce the bloating. If you had a lower endoscopy (such as a colonoscopy or flexible sigmoidoscopy) you may notice spotting of blood in your stool or on the toilet paper. If you underwent a bowel prep for your procedure, then you may not have a normal bowel movement for a few days.  DIET: Your first meal following the procedure should be a light meal and then it is ok to progress to your normal diet.  A half-sandwich or bowl of soup is an example of a good first meal.  Heavy or fried foods are harder to digest and may make you feel nauseous or bloated.  Likewise meals heavy in dairy and vegetables can cause extra gas to form and this can also increase the bloating.  Drink plenty of fluids but you should avoid alcoholic beverages for 24 hours.  ACTIVITY: Your care partner should take you home directly after the procedure.  You should plan to take it easy, moving slowly for the rest of the day.  You can resume normal activity the day after the procedure however you should NOT DRIVE or use heavy machinery for 24 hours (because of the sedation medicines used during the test).    SYMPTOMS TO REPORT  IMMEDIATELY: A gastroenterologist can be reached at any hour.  During normal business hours, 8:30 AM to 5:00 PM Monday through Friday, call [phone].  After hours and on weekends, please call the GI answering service at [phone] who will take a message and have the physician on call contact you.   Following lower endoscopy (colonoscopy or flexible sigmoidoscopy):  Excessive amounts of blood in the stool  Significant tenderness or worsening of abdominal pains  Swelling of the abdomen that is new, acute  Fever of 100F or higher FOLLOW UP: If any biopsies were taken you will be contacted by phone or by letter within the next 1-3 weeks.  Call your gastroenterologist if you have not heard about the biopsies in 3 weeks.  Our staff will call the home number listed on your records the next business day following your procedure to check on you and address any questions or concerns that you may have at that time regarding the information given to you following your procedure. This is a courtesy call and so if there is no answer at the home number and we have not heard from you through the emergency physician on call, we will assume that you have returned to your regular daily activities without incident.  SIGNATURES/CONFIDENTIALITY: You and/or your care partner have signed paperwork which will be entered into your electronic medical record.  These signatures attest to the fact that that the information above on your After Visit  Summary has been reviewed and is understood.  Full responsibility of the confidentiality of this discharge information lies with you and/or your care-partner.

## 2013-04-29 NOTE — Progress Notes (Signed)
Report to pacu rn, vss, bbs=clear 

## 2013-04-30 ENCOUNTER — Telehealth: Payer: Self-pay | Admitting: *Deleted

## 2013-04-30 NOTE — Telephone Encounter (Signed)
  Follow up Call-  Call back number 04/29/2013  Post procedure Call Back phone  # [phone]  Permission to leave phone message Yes     Patient questions:  Do you have a fever, pain , or abdominal swelling? no Pain Score  0 *  Have you tolerated food without any problems? yes  Have you been able to return to your normal activities? yes  Do you have any questions about your discharge instructions: Diet   no Medications  no Follow up visit  no  Do you have questions or concerns about your Care? no  Actions: * If pain score is 4 or above: No action needed, pain <4.

## 2013-06-13 ENCOUNTER — Other Ambulatory Visit (INDEPENDENT_AMBULATORY_CARE_PROVIDER_SITE_OTHER): Payer: BC Managed Care – PPO

## 2013-06-13 ENCOUNTER — Ambulatory Visit (INDEPENDENT_AMBULATORY_CARE_PROVIDER_SITE_OTHER): Payer: BC Managed Care – PPO | Admitting: Internal Medicine

## 2013-06-13 ENCOUNTER — Encounter: Payer: Self-pay | Admitting: Internal Medicine

## 2013-06-13 VITALS — BP 102/70 | HR 64 | Temp 97.9°F | Wt 161.1 lb

## 2013-06-13 DIAGNOSIS — R239 Unspecified skin changes: Secondary | ICD-10-CM | POA: Insufficient documentation

## 2013-06-13 DIAGNOSIS — R238 Other skin changes: Secondary | ICD-10-CM

## 2013-06-13 DIAGNOSIS — Z0000 Encounter for general adult medical examination without abnormal findings: Secondary | ICD-10-CM

## 2013-06-13 LAB — CBC WITH DIFFERENTIAL/PLATELET
Basophils Absolute: 0 10*3/uL (ref 0.0–0.1)
Basophils Relative: 0.3 % (ref 0.0–3.0)
EOS PCT: 1.2 % (ref 0.0–5.0)
Eosinophils Absolute: 0.1 10*3/uL (ref 0.0–0.7)
HEMATOCRIT: 44.8 % (ref 39.0–52.0)
HEMOGLOBIN: 15.4 g/dL (ref 13.0–17.0)
LYMPHS ABS: 1.6 10*3/uL (ref 0.7–4.0)
Lymphocytes Relative: 25.3 % (ref 12.0–46.0)
MCHC: 34.4 g/dL (ref 30.0–36.0)
MCV: 89.8 fl (ref 78.0–100.0)
Monocytes Absolute: 0.5 10*3/uL (ref 0.1–1.0)
Monocytes Relative: 8.6 % (ref 3.0–12.0)
NEUTROS PCT: 64.6 % (ref 43.0–77.0)
Neutro Abs: 4 10*3/uL (ref 1.4–7.7)
Platelets: 220 10*3/uL (ref 150.0–400.0)
RBC: 4.99 Mil/uL (ref 4.22–5.81)
RDW: 13.8 % (ref 11.5–15.5)
WBC: 6.2 10*3/uL (ref 4.0–10.5)

## 2013-06-13 LAB — BASIC METABOLIC PANEL
BUN: 12 mg/dL (ref 6–23)
CHLORIDE: 103 meq/L (ref 96–112)
CO2: 31 mEq/L (ref 19–32)
Calcium: 9.6 mg/dL (ref 8.4–10.5)
Creatinine, Ser: 0.9 mg/dL (ref 0.4–1.5)
GFR: 105.23 mL/min (ref >60.00)
Glucose, Bld: 104 mg/dL — ABNORMAL HIGH (ref 70–99)
POTASSIUM: 4.4 meq/L (ref 3.5–5.1)
SODIUM: 140 meq/L (ref 135–145)

## 2013-06-13 LAB — URINALYSIS, ROUTINE W REFLEX MICROSCOPIC
Bilirubin Urine: NEGATIVE
Ketones, ur: NEGATIVE
Leukocytes, UA: NEGATIVE
NITRITE: NEGATIVE
Specific Gravity, Urine: 1.015 (ref 1.000–1.030)
TOTAL PROTEIN, URINE-UPE24: NEGATIVE
UROBILINOGEN UA: 0.2 (ref 0.0–1.0)
Urine Glucose: NEGATIVE
pH: 5.5 (ref 5.0–8.0)

## 2013-06-13 LAB — LIPID PANEL
CHOL/HDL RATIO: 5
Cholesterol: 179 mg/dL (ref 0–200)
HDL: 35.6 mg/dL — ABNORMAL LOW (ref >39.00)
LDL Cholesterol: 117 mg/dL — ABNORMAL HIGH (ref 0–99)
NonHDL: 143.4
TRIGLYCERIDES: 133 mg/dL (ref 0.0–149.0)
VLDL: 26.6 mg/dL (ref 0.0–40.0)

## 2013-06-13 LAB — HEPATIC FUNCTION PANEL
ALT: 33 U/L (ref 0–53)
AST: 29 U/L (ref 0–37)
Albumin: 4.5 g/dL (ref 3.5–5.2)
Alkaline Phosphatase: 59 U/L (ref 39–117)
BILIRUBIN DIRECT: 0.1 mg/dL (ref 0.0–0.3)
Total Bilirubin: 1 mg/dL (ref 0.2–1.2)
Total Protein: 7.6 g/dL (ref 6.0–8.3)

## 2013-06-13 LAB — TSH: TSH: 1.31 u[IU]/mL (ref 0.35–4.50)

## 2013-06-13 NOTE — Progress Notes (Signed)
Pre visit review using our clinic review tool, if applicable. No additional management support is needed unless otherwise documented below in the visit note. 

## 2013-06-13 NOTE — Patient Instructions (Signed)
It was good to see you today.  We have reviewed your prior records including labs and tests today  Test(s) ordered today. Your results will be released to [place] (or called to you) after review, usually within 72hours after test completion. If any changes need to be made, you will be notified at that same time.  Medications reviewed and updated, no changes recommended at this time.  we'll make referral to dermatology. Our office will contact you regarding appointment(s) once made.  Please keep scheduled followup as planned, call sooner if problems.

## 2013-06-13 NOTE — Assessment & Plan Note (Signed)
Skin change R shin -   present without change >18 mo, but new in past 3 years per pt  Cracking skin with use of rx steroid - advised to stop same and mosturize instead  Refer to derm for opinion and consideration of bx

## 2013-06-13 NOTE — Progress Notes (Signed)
Subjective:    Patient ID: Curtis [name], male    DOB: 05/07/1975, 38 y.o.   MRN: [id number]  HPI  Patient here for follow up on R skin changes new color change >3 years ago, no change in 58moReviewed chronic medical issues and interval medical events  Past Medical History  Diagnosis Date  . ACNE, CYSTIC   . INGUINAL HERNIA, RIGHT 04/24/2008    s/p RIH repair  . Lactose intolerance   . IBS (irritable bowel syndrome)   . Arthritis     spine, early onset    Review of Systems  Constitutional: Negative for fever and fatigue.  Respiratory: Negative for cough and shortness of breath.   Cardiovascular: Negative for chest pain and leg swelling.  Skin: Positive for color change. Negative for pallor and rash.       Objective:   Physical Exam  BP 102/70  Pulse 64  Temp(Src) 97.9 F (36.6 C) (Oral)  Wt 161 lb 1.9 oz (73.084 kg)  SpO2 98% Wt Readings from Last 3 Encounters:  06/13/13 161 lb 1.9 oz (73.084 kg)  04/29/13 159 lb (72.122 kg)  04/03/13 161 lb (73.029 kg)   Constitutional: he appears well-developed and well-nourished. No distress.  Neck: Normal range of motion. Neck supple. No JVD present. No thyromegaly present.  Cardiovascular: Normal rate, regular rhythm and normal heart sounds.  No murmur heard. No BLE edema. Pulmonary/Chest: Effort normal and breath sounds normal. No respiratory distress. he has no wheezes.  Skin: R shin with 2.5" x 1.25" hyperpigmented lesion, no erythema, raised changes - irregular edge but even coloring  Psychiatric: he has a normal mood and affect. His behavior is normal. Judgment and thought content normal.   Lab Results  Component Value Date   WBC 6.1 03/07/2013   HGB 15.4 03/07/2013   HCT 45.7 03/07/2013   PLT 219.0 03/07/2013   GLUCOSE 97 03/07/2013   CHOL 178 01/25/2012   TRIG 154.0* 01/25/2012   HDL 31.60* 01/25/2012   LDLCALC 116* 01/25/2012   ALT 33 03/07/2013   AST 23 03/07/2013   NA 139 03/07/2013   K 3.9 03/07/2013   CL 102  03/07/2013   CREATININE 1.0 03/07/2013   BUN 16 03/07/2013   CO2 31 03/07/2013   TSH 1.06 03/07/2013   HGBA1C 5.6 01/25/2012    Ct Abdomen Pelvis W Contrast  03/11/2013   CLINICAL DATA:  Low abdominal pain  EXAM: CT ABDOMEN AND PELVIS WITH CONTRAST  TECHNIQUE: Multidetector CT imaging of the abdomen and pelvis was performed using the standard protocol following bolus administration of intravenous contrast.  CONTRAST:  1070mOMNIPAQUE IOHEXOL 300 MG/ML  SOLN  COMPARISON:  CT ABD W/CM dated 05/19/2008  FINDINGS: Lung bases are clear. Liver, adrenal glands, kidneys, spleen, and pancreas are normal. No ascites or lymphadenopathy. No free air or fluid.  Appendix is normal. No bowel wall thickening or focal segmental dilatation. No pelvic free fluid or lymphadenopathy. The bladder is normal. No static evidence for recurrent inguinal hernia.  No acute osseous abnormality.  IMPRESSION: No acute intra-abdominal or pelvic pathology.   Electronically Signed   By: GrConchita Paris.D.   On: 03/11/2013 11:23       Assessment & Plan:   Problem List Items Addressed This Visit   Skin change - Primary     Skin change R shin -   present without change >18 mo, but new in past 3 years per pt  Cracking skin with use  of rx steroid - advised to stop same and mosturize instead  Refer to derm for opinion and consideration of bx    Relevant Orders      Ambulatory referral to Dermatology    Other Visit Diagnoses   Routine general medical examination at a health care facility        Relevant Orders       Basic metabolic panel       CBC with Differential       Hepatic function panel       Lipid panel       TSH       Urinalysis, Routine w reflex microscopic

## 2013-08-20 ENCOUNTER — Ambulatory Visit (INDEPENDENT_AMBULATORY_CARE_PROVIDER_SITE_OTHER): Payer: BC Managed Care – PPO | Admitting: Internal Medicine

## 2013-08-20 ENCOUNTER — Encounter: Payer: Self-pay | Admitting: Internal Medicine

## 2013-08-20 VITALS — BP 122/90 | HR 72 | Temp 97.6°F | Ht 73.0 in | Wt 161.5 lb

## 2013-08-20 DIAGNOSIS — Z23 Encounter for immunization: Secondary | ICD-10-CM

## 2013-08-20 DIAGNOSIS — Z0000 Encounter for general adult medical examination without abnormal findings: Secondary | ICD-10-CM

## 2013-08-20 NOTE — Progress Notes (Signed)
Subjective:    Patient ID: Curtis [name], male    DOB: November 23, 1975, 38 y.o.   MRN: [id number]  HPI  patient is here today for annual physical. Patient feels well and has no complaints.  Also reviewed chronic medical issues and interval medical events  Past Medical History  Diagnosis Date  . ACNE, CYSTIC   . INGUINAL HERNIA, RIGHT 04/24/2008    s/p RIH repair  . Lactose intolerance   . IBS (irritable bowel syndrome)   . Arthritis     spine, early onset   Family History  Problem Relation Age of Onset  . Stroke Maternal Grandmother   . Colon polyps Father   . Urolithiasis Father     Had kidney removed at age 32  . Colon cancer Neg Hx    History  Substance Use Topics  . Smoking status: Never Smoker   . Smokeless tobacco: Never Used  . Alcohol Use: 1.8 oz/week    3 Cans of beer per week     Comment: weekends    Review of Systems  Constitutional: Negative for fever, activity change, appetite change, fatigue and unexpected weight change.  Respiratory: Negative for cough, chest tightness, shortness of breath and wheezing.   Cardiovascular: Negative for chest pain, palpitations and leg swelling.  Neurological: Negative for dizziness, weakness and headaches.  Psychiatric/Behavioral: Negative for dysphoric mood. The patient is not nervous/anxious.   All other systems reviewed and are negative.      Objective:   Physical Exam  BP 122/90  Pulse 72  Temp(Src) 97.6 F (36.4 C) (Oral)  Ht 6' 1"  (1.854 m)  Wt 161 lb 8 oz (73.256 kg)  BMI 21.31 kg/m2  SpO2 97% Wt Readings from Last 3 Encounters:  08/20/13 161 lb 8 oz (73.256 kg)  06/13/13 161 lb 1.9 oz (73.084 kg)  04/29/13 159 lb (72.122 kg)   Constitutional: he appears well-developed and well-nourished. No distress.  HENT: Head: Normocephalic and atraumatic. Ears: B TMs ok, no erythema or effusion; Nose: Nose normal. Mouth/Throat: Oropharynx is clear and moist. No oropharyngeal exudate.  Eyes: Conjunctivae and  EOM are normal. Pupils are equal, round, and reactive to light. No scleral icterus.  Neck: Normal range of motion. Neck supple. No JVD present. No thyromegaly present.  Cardiovascular: Normal rate, regular rhythm and normal heart sounds.  No murmur heard. No BLE edema. Pulmonary/Chest: Effort normal and breath sounds normal. No respiratory distress. he has no wheezes.  Abdominal: Soft. Bowel sounds are normal. he exhibits no distension. There is no tenderness. no masses GU: supervised by Chanetta Marshall NP-s: circumcised male, normal penis without lesions or discharge. Testicles bilaterally descended without mass or tenderness Musculoskeletal: Normal range of motion, no joint effusions. No gross deformities Neurological: he is alert and oriented to person, place, and time. No cranial nerve deficit. Coordination, balance, strength, speech and gait are normal.  Skin: Skin is warm and dry. No rash noted. No erythema.  Psychiatric: he has a normal mood and affect. behavior is normal. Judgment and thought content normal.   Lab Results  Component Value Date   WBC 6.2 06/13/2013   HGB 15.4 06/13/2013   HCT 44.8 06/13/2013   PLT 220.0 06/13/2013   GLUCOSE 104* 06/13/2013   CHOL 179 06/13/2013   TRIG 133.0 06/13/2013   HDL 35.60* 06/13/2013   LDLCALC 117* 06/13/2013   ALT 33 06/13/2013   AST 29 06/13/2013   NA 140 06/13/2013   K 4.4 06/13/2013   CL  103 06/13/2013   CREATININE 0.9 06/13/2013   BUN 12 06/13/2013   CO2 31 06/13/2013   TSH 1.31 06/13/2013   HGBA1C 5.6 01/25/2012    Ct Abdomen Pelvis W Contrast  03/11/2013   CLINICAL DATA:  Low abdominal pain  EXAM: CT ABDOMEN AND PELVIS WITH CONTRAST  TECHNIQUE: Multidetector CT imaging of the abdomen and pelvis was performed using the standard protocol following bolus administration of intravenous contrast.  CONTRAST:  146m OMNIPAQUE IOHEXOL 300 MG/ML  SOLN  COMPARISON:  CT ABD W/CM dated 05/19/2008  FINDINGS: Lung bases are clear. Liver, adrenal glands, kidneys,  spleen, and pancreas are normal. No ascites or lymphadenopathy. No free air or fluid.  Appendix is normal. No bowel wall thickening or focal segmental dilatation. No pelvic free fluid or lymphadenopathy. The bladder is normal. No static evidence for recurrent inguinal hernia.  No acute osseous abnormality.  IMPRESSION: No acute intra-abdominal or pelvic pathology.   Electronically Signed   By: GConchita ParisM.D.   On: 03/11/2013 11:23       Assessment & Plan:   CPX/v70.0 - Patient has been counseled on age-appropriate routine health concerns for screening and prevention. These are reviewed and up-to-date. Immunizations are up-to-date or declined. Labs reviewed.

## 2013-08-20 NOTE — Patient Instructions (Addendum)
It was good to see you today.  We have reviewed your prior records including labs and tests today  Health Maintenance reviewed - influenza vaccine updated today. All other recommended immunizations and age-appropriate screenings are up-to-date.  Medications reviewed and updated, no changes recommended at this time.  Please schedule followup in 12 months for annual exam and labs, call sooner if problems.  Health Maintenance A healthy lifestyle and preventative care can promote health and wellness.  Maintain regular health, dental, and eye exams.  Eat a healthy diet. Foods like vegetables, fruits, whole grains, low-fat dairy products, and lean protein foods contain the nutrients you need and are low in calories. Decrease your intake of foods high in solid fats, added sugars, and salt. Get information about a proper diet from your health care provider, if necessary.  Regular physical exercise is one of the most important things you can do for your health. Most adults should get at least 150 minutes of moderate-intensity exercise (any activity that increases your heart rate and causes you to sweat) each week. In addition, most adults need muscle-strengthening exercises on 2 or more days a week.   Maintain a healthy weight. The body mass index (BMI) is a screening tool to identify possible weight problems. It provides an estimate of body fat based on height and weight. Your health care provider can find your BMI and can help you achieve or maintain a healthy weight. For males 20 years and older:  A BMI below 18.5 is considered underweight.  A BMI of 18.5 to 24.9 is normal.  A BMI of 25 to 29.9 is considered overweight.  A BMI of 30 and above is considered obese.  Maintain normal blood lipids and cholesterol by exercising and minimizing your intake of saturated fat. Eat a balanced diet with plenty of fruits and vegetables. Blood tests for lipids and cholesterol should begin at age 67 and be  repeated every 5 years. If your lipid or cholesterol levels are high, you are over age 106, or you are at high risk for heart disease, you may need your cholesterol levels checked more frequently.Ongoing high lipid and cholesterol levels should be treated with medicines if diet and exercise are not working.  If you smoke, find out from your health care provider how to quit. If you do not use tobacco, do not start.  Lung cancer screening is recommended for adults aged 43-80 years who are at high risk for developing lung cancer because of a history of smoking. A yearly low-dose CT scan of the lungs is recommended for people who have at least a 30-pack-year history of smoking and are current smokers or have quit within the past 15 years. A pack year of smoking is smoking an average of 1 pack of cigarettes a day for 1 year (for example, a 30-pack-year history of smoking could mean smoking 1 pack a day for 30 years or 2 packs a day for 15 years). Yearly screening should continue until the smoker has stopped smoking for at least 15 years. Yearly screening should be stopped for people who develop a health problem that would prevent them from having lung cancer treatment.  If you choose to drink alcohol, do not have more than 2 drinks per day. One drink is considered to be 12 oz (360 mL) of beer, 5 oz (150 mL) of wine, or 1.5 oz (45 mL) of liquor.  Avoid the use of street drugs. Do not share needles with anyone. Ask for help  if you need support or instructions about stopping the use of drugs.  High blood pressure causes heart disease and increases the risk of stroke. Blood pressure should be checked at least every 1-2 years. Ongoing high blood pressure should be treated with medicines if weight loss and exercise are not effective.  If you are 73-71 years old, ask your health care provider if you should take aspirin to prevent heart disease.  Diabetes screening involves taking a blood sample to check your  fasting blood sugar level. This should be done once every 3 years after age 46 if you are at a normal weight and without risk factors for diabetes. Testing should be considered at a younger age or be carried out more frequently if you are overweight and have at least 1 risk factor for diabetes.  Colorectal cancer can be detected and often prevented. Most routine colorectal cancer screening begins at the age of 33 and continues through age 73. However, your health care provider may recommend screening at an earlier age if you have risk factors for colon cancer. On a yearly basis, your health care provider may provide home test kits to check for hidden blood in the stool. A small camera at the end of a tube may be used to directly examine the colon (sigmoidoscopy or colonoscopy) to detect the earliest forms of colorectal cancer. Talk to your health care provider about this at age 75 when routine screening begins. A direct exam of the colon should be repeated every 5-10 years through age 43, unless early forms of precancerous polyps or small growths are found.  People who are at an increased risk for hepatitis B should be screened for this virus. You are considered at high risk for hepatitis B if:  You were born in a country where hepatitis B occurs often. Talk with your health care provider about which countries are considered high risk.  Your parents were born in a high-risk country and you have not received a shot to protect against hepatitis B (hepatitis B vaccine).  You have HIV or AIDS.  You use needles to inject street drugs.  You live with, or have sex with, someone who has hepatitis B.  You are a man who has sex with other men (MSM).  You get hemodialysis treatment.  You take certain medicines for conditions like cancer, organ transplantation, and autoimmune conditions.  Hepatitis C blood testing is recommended for all people born from [birth year] through 1965 and any individual with known risk  factors for hepatitis C.  Healthy men should no longer receive prostate-specific antigen (PSA) blood tests as part of routine cancer screening. Talk to your health care provider about prostate cancer screening.  Testicular cancer screening is not recommended for adolescents or adult males who have no symptoms. Screening includes self-exam, a health care provider exam, and other screening tests. Consult with your health care provider about any symptoms you have or any concerns you have about testicular cancer.  Practice safe sex. Use condoms and avoid high-risk sexual practices to reduce the spread of sexually transmitted infections (STIs).  You should be screened for STIs, including gonorrhea and chlamydia if:  You are sexually active and are younger than 24 years.  You are older than 24 years, and your health care provider tells you that you are at risk for this type of infection.  Your sexual activity has changed since you were last screened, and you are at an increased risk for chlamydia or gonorrhea. Ask  your health care provider if you are at risk.  If you are at risk of being infected with HIV, it is recommended that you take a prescription medicine daily to prevent HIV infection. This is called pre-exposure prophylaxis (PrEP). You are considered at risk if:  You are a man who has sex with other men (MSM).  You are a heterosexual man who is sexually active with multiple partners.  You take drugs by injection.  You are sexually active with a partner who has HIV.  Talk with your health care provider about whether you are at high risk of being infected with HIV. If you choose to begin PrEP, you should first be tested for HIV. You should then be tested every 3 months for as long as you are taking PrEP.  Use sunscreen. Apply sunscreen liberally and repeatedly throughout the day. You should seek shade when your shadow is shorter than you. Protect yourself by wearing long sleeves, pants, a  wide-brimmed hat, and sunglasses year round whenever you are outdoors.  Tell your health care provider of new moles or changes in moles, especially if there is a change in shape or color. Also, tell your health care provider if a mole is larger than the size of a pencil eraser.  A one-time screening for abdominal aortic aneurysm (AAA) and surgical repair of large AAAs by ultrasound is recommended for men aged 33-75 years who are current or former smokers.  Stay current with your vaccines (immunizations). Document Released: 06/17/2007 Document Revised: 12/24/2012 Document Reviewed: 05/16/2010 [facility] Patient Information 2015 [place], [place]. This information is not intended to replace advice given to you by your health care provider. Make sure you discuss any questions you have with your health care provider.

## 2013-08-20 NOTE — Progress Notes (Signed)
Pre visit review using our clinic review tool, if applicable. No additional management support is needed unless otherwise documented below in the visit note. 

## 2013-12-08 ENCOUNTER — Encounter: Payer: Self-pay | Admitting: Family

## 2013-12-08 ENCOUNTER — Ambulatory Visit (INDEPENDENT_AMBULATORY_CARE_PROVIDER_SITE_OTHER): Payer: BC Managed Care – PPO | Admitting: Family

## 2013-12-08 VITALS — BP 142/92 | HR 70 | Temp 97.6°F | Resp 18 | Ht 73.0 in | Wt 170.4 lb

## 2013-12-08 DIAGNOSIS — N489 Disorder of penis, unspecified: Secondary | ICD-10-CM

## 2013-12-08 DIAGNOSIS — N4889 Other specified disorders of penis: Secondary | ICD-10-CM

## 2013-12-08 MED ORDER — CLOTRIMAZOLE-BETAMETHASONE 1-0.05 % EX CREA: 1.0000 "application " | TOPICAL_CREAM | Freq: Two times a day (BID) | CUTANEOUS | Status: DC

## 2013-12-08 NOTE — Progress Notes (Signed)
Pre visit review using our clinic review tool, if applicable. No additional management support is needed unless otherwise documented below in the visit note. 

## 2013-12-08 NOTE — Progress Notes (Signed)
   Subjective:    Patient ID: Curtis [name], male    DOB: 01-Mar-1975, 38 y.o.   MRN: [id number]  Chief Complaint  Patient presents with  . Rash    on private area x3 weeks, no burning or itching, usually occurs in the winter    HPI:  Curtis [name] is a 38 y.o. male who presents today for an acute visit.   Acute rash has been going on for about 3 weeks on the shaft of his penis. Describes as red. Denies burning or itching. States that it usually occurs in the winter. Has tried to use lotion. The lotion tends to make it worse.   No Known Allergies  No current outpatient prescriptions on file prior to visit.   No current facility-administered medications on file prior to visit.   Review of Systems   See HPI    Objective:    BP 142/92 mmHg  Pulse 70  Temp(Src) 97.6 F (36.4 C) (Oral)  Resp 18  Ht 6' 1"  (1.854 m)  Wt 170 lb 6.4 oz (77.293 kg)  BMI 22.49 kg/m2  SpO2 98% Nursing note and vital signs reviewed.  Physical Exam  Constitutional: He is oriented to person, place, and time. He appears well-developed and well-nourished. No distress.  Cardiovascular: Normal rate, regular rhythm, normal heart sounds and intact distal pulses.   Pulmonary/Chest: Effort normal and breath sounds normal.  Genitourinary:  Small red macules noted sporadically around the base of the glans penis. No evidence of discharge or vesicle formation.  Neurological: He is alert and oriented to person, place, and time.  Skin: Skin is warm and dry.  Psychiatric: He has a normal mood and affect. His behavior is normal. Judgment and thought content normal.      Assessment & Plan:

## 2013-12-08 NOTE — Assessment & Plan Note (Signed)
Exam consistent with potential fungal infection. Start Clotrimazole- betamethasone cream. Discuss potential non-prescriptive medication interventions for future breakouts. Follow up if symptoms worsen or fail to improve.

## 2013-12-08 NOTE — Patient Instructions (Addendum)
Thank you for choosing [facility].  Summary/Instructions:  Your prescription(s) have been submitted to your pharmacy. Please take as directed and contact our office if you believe you are having problem(s) with the medication(s).  If your symptoms worsen or fail to improve, please contact our office for further instruction, or in case of emergency go directly to the emergency room at the closest medical facility.    Body Ringworm Ringworm (tinea corporis) is a fungal infection of the skin on the body. This infection is not caused by worms, but is actually caused by a fungus. Fungus normally lives on the top of your skin and can be useful. However, in the case of ringworms, the fungus grows out of control and causes a skin infection. It can involve any area of skin on the body and can spread easily from one person to another (contagious). Ringworm is a common problem for children, but it can affect adults as well. Ringworm is also often found in athletes, especially wrestlers who share equipment and mats.  CAUSES  Ringworm of the body is caused by a fungus called dermatophyte. It can spread by:  Touchingother people who are infected.  Touchinginfected pets.  Touching or sharingobjects that have been in contact with the infected person or pet (hats, combs, towels, clothing, sports equipment). SYMPTOMS   Itchy, raised red spots and bumps on the skin.  Ring-shaped rash.  Redness near the border of the rash with a clear center.  Dry and scaly skin on or around the rash. Not every person develops a ring-shaped rash. Some develop only the red, scaly patches. DIAGNOSIS  Most often, ringworm can be diagnosed by performing a skin exam. Your caregiver may choose to take a skin scraping from the affected area. The sample will be examined under the microscope to see if the fungus is present.  TREATMENT  Body ringworm may be treated with a topical antifungal cream or ointment. Sometimes,  an antifungal shampoo that can be used on your body is prescribed. You may be prescribed antifungal medicines to take by mouth if your ringworm is severe, keeps coming back, or lasts a long time.  HOME CARE INSTRUCTIONS   Only take over-the-counter or prescription medicines as directed by your caregiver.  Wash the infected area and dry it completely before applying yourcream or ointment.  When using antifungal shampoo to treat the ringworm, leave the shampoo on the body for 3-5 minutes before rinsing.   Wear loose clothing to stop clothes from rubbing and irritating the rash.  Wash or change your bed sheets every night while you have the rash.  Have your pet treated by your veterinarian if it has the same infection. To prevent ringworm:   Practice good hygiene.  Wear sandals or shoes in public places and showers.  Do not share personal items with others.  Avoid touching red patches of skin on other people.  Avoid touching pets that have bald spots or wash your hands after doing so. SEEK MEDICAL CARE IF:   Your rash continues to spread after 7 days of treatment.  Your rash is not gone in 4 weeks.  The area around your rash becomes red, warm, tender, and swollen. Document Released: 12/17/1999 Document Revised: 09/13/2011 Document Reviewed: 07/03/2011 [facility] Patient Information 2015 [place], [place]. This information is not intended to replace advice given to you by your health care provider. Make sure you discuss any questions you have with your health care provider.

## 2014-08-24 ENCOUNTER — Encounter: Payer: Self-pay | Admitting: Internal Medicine

## 2014-08-24 ENCOUNTER — Ambulatory Visit (INDEPENDENT_AMBULATORY_CARE_PROVIDER_SITE_OTHER): Payer: BLUE CROSS/BLUE SHIELD | Admitting: Internal Medicine

## 2014-08-24 VITALS — BP 112/72 | HR 66 | Temp 97.9°F | Ht 73.0 in | Wt 166.0 lb

## 2014-08-24 DIAGNOSIS — Z0000 Encounter for general adult medical examination without abnormal findings: Secondary | ICD-10-CM | POA: Diagnosis not present

## 2014-08-24 NOTE — Patient Instructions (Addendum)
It was good to see you today.  We have reviewed your prior records including labs and tests today  Health Maintenance reviewed - flu shot recommended each fall season - all other recommended immunizations and age-appropriate screenings are up-to-date.  Test(s) ordered today. Return this week in morning when you are fasting.Your results will be released to [facility] (or called to you) after review, usually within 72hours after test completion. If any changes need to be made, you will be notified at that same time.  Medications reviewed and updated, no changes recommended at this time.  Please schedule followup in 12 months for annual exam and labs, call sooner if problems.  Health Maintenance A healthy lifestyle and preventative care can promote health and wellness.  Maintain regular health, dental, and eye exams.  Eat a healthy diet. Foods like vegetables, fruits, whole grains, low-fat dairy products, and lean protein foods contain the nutrients you need and are low in calories. Decrease your intake of foods high in solid fats, added sugars, and salt. Get information about a proper diet from your health care provider, if necessary.  Regular physical exercise is one of the most important things you can do for your health. Most adults should get at least 150 minutes of moderate-intensity exercise (any activity that increases your heart rate and causes you to sweat) each week. In addition, most adults need muscle-strengthening exercises on 2 or more days a week.   Maintain a healthy weight. The body mass index (BMI) is a screening tool to identify possible weight problems. It provides an estimate of body fat based on height and weight. Your health care provider can find your BMI and can help you achieve or maintain a healthy weight. For males 20 years and older:  A BMI below 18.5 is considered underweight.  A BMI of 18.5 to 24.9 is normal.  A BMI of 25 to 29.9 is considered overweight.  A  BMI of 30 and above is considered obese.  Maintain normal blood lipids and cholesterol by exercising and minimizing your intake of saturated fat. Eat a balanced diet with plenty of fruits and vegetables. Blood tests for lipids and cholesterol should begin at age 12 and be repeated every 5 years. If your lipid or cholesterol levels are high, you are over age 53, or you are at high risk for heart disease, you may need your cholesterol levels checked more frequently.Ongoing high lipid and cholesterol levels should be treated with medicines if diet and exercise are not working.  If you smoke, find out from your health care provider how to quit. If you do not use tobacco, do not start.  Lung cancer screening is recommended for adults aged 56-80 years who are at high risk for developing lung cancer because of a history of smoking. A yearly low-dose CT scan of the lungs is recommended for people who have at least a 30-pack-year history of smoking and are current smokers or have quit within the past 15 years. A pack year of smoking is smoking an average of 1 pack of cigarettes a day for 1 year (for example, a 30-pack-year history of smoking could mean smoking 1 pack a day for 30 years or 2 packs a day for 15 years). Yearly screening should continue until the smoker has stopped smoking for at least 15 years. Yearly screening should be stopped for people who develop a health problem that would prevent them from having lung cancer treatment.  If you choose to drink alcohol, do  not have more than 2 drinks per day. One drink is considered to be 12 oz (360 mL) of beer, 5 oz (150 mL) of wine, or 1.5 oz (45 mL) of liquor.  Avoid the use of street drugs. Do not share needles with anyone. Ask for help if you need support or instructions about stopping the use of drugs.  High blood pressure causes heart disease and increases the risk of stroke. Blood pressure should be checked at least every 1-2 years. Ongoing high blood  pressure should be treated with medicines if weight loss and exercise are not effective.  If you are 50-15 years old, ask your health care provider if you should take aspirin to prevent heart disease.  Diabetes screening involves taking a blood sample to check your fasting blood sugar level. This should be done once every 3 years after age 61 if you are at a normal weight and without risk factors for diabetes. Testing should be considered at a younger age or be carried out more frequently if you are overweight and have at least 1 risk factor for diabetes.  Colorectal cancer can be detected and often prevented. Most routine colorectal cancer screening begins at the age of 58 and continues through age 62. However, your health care provider may recommend screening at an earlier age if you have risk factors for colon cancer. On a yearly basis, your health care provider may provide home test kits to check for hidden blood in the stool. A small camera at the end of a tube may be used to directly examine the colon (sigmoidoscopy or colonoscopy) to detect the earliest forms of colorectal cancer. Talk to your health care provider about this at age 35 when routine screening begins. A direct exam of the colon should be repeated every 5-10 years through age 14, unless early forms of precancerous polyps or small growths are found.  People who are at an increased risk for hepatitis B should be screened for this virus. You are considered at high risk for hepatitis B if:  You were born in a country where hepatitis B occurs often. Talk with your health care provider about which countries are considered high risk.  Your parents were born in a high-risk country and you have not received a shot to protect against hepatitis B (hepatitis B vaccine).  You have HIV or AIDS.  You use needles to inject street drugs.  You live with, or have sex with, someone who has hepatitis B.  You are a man who has sex with other men  (MSM).  You get hemodialysis treatment.  You take certain medicines for conditions like cancer, organ transplantation, and autoimmune conditions.  Hepatitis C blood testing is recommended for all people born from [birth year] through 1966 and any individual with known risk factors for hepatitis C.  Healthy men should no longer receive prostate-specific antigen (PSA) blood tests as part of routine cancer screening. Talk to your health care provider about prostate cancer screening.  Testicular cancer screening is not recommended for adolescents or adult males who have no symptoms. Screening includes self-exam, a health care provider exam, and other screening tests. Consult with your health care provider about any symptoms you have or any concerns you have about testicular cancer.  Practice safe sex. Use condoms and avoid high-risk sexual practices to reduce the spread of sexually transmitted infections (STIs).  You should be screened for STIs, including gonorrhea and chlamydia if:  You are sexually active and are younger than  24 years.  You are older than 24 years, and your health care provider tells you that you are at risk for this type of infection.  Your sexual activity has changed since you were last screened, and you are at an increased risk for chlamydia or gonorrhea. Ask your health care provider if you are at risk.  If you are at risk of being infected with HIV, it is recommended that you take a prescription medicine daily to prevent HIV infection. This is called pre-exposure prophylaxis (PrEP). You are considered at risk if:  You are a man who has sex with other men (MSM).  You are a heterosexual man who is sexually active with multiple partners.  You take drugs by injection.  You are sexually active with a partner who has HIV.  Talk with your health care provider about whether you are at high risk of being infected with HIV. If you choose to begin PrEP, you should first be tested for  HIV. You should then be tested every 3 months for as long as you are taking PrEP.  Use sunscreen. Apply sunscreen liberally and repeatedly throughout the day. You should seek shade when your shadow is shorter than you. Protect yourself by wearing long sleeves, pants, a wide-brimmed hat, and sunglasses year round whenever you are outdoors.  Tell your health care provider of new moles or changes in moles, especially if there is a change in shape or color. Also, tell your health care provider if a mole is larger than the size of a pencil eraser.  A one-time screening for abdominal aortic aneurysm (AAA) and surgical repair of large AAAs by ultrasound is recommended for men aged 108-75 years who are current or former smokers.  Stay current with your vaccines (immunizations). Document Released: 06/17/2007 Document Revised: 12/24/2012 Document Reviewed: 05/16/2010 [facility] Patient Information 2015 [place], [place]. This information is not intended to replace advice given to you by your health care provider. Make sure you discuss any questions you have with your health care provider.

## 2014-08-24 NOTE — Progress Notes (Signed)
Subjective:    Patient ID: Curtis [name], male    DOB: [birth year], 39 y.o.   MRN: [id number]  HPI  patient is here today for annual physical. Patient feels well and has no complaints.  Also reviewed chronic medical conditions, interval events and current concerns  Past Medical History  Diagnosis Date  . ACNE, CYSTIC     resolved  . INGUINAL HERNIA, RIGHT 04/24/2008    s/p RIH repair  . Lactose intolerance     diet controlled  . IBS (irritable bowel syndrome)   . Arthritis     spine, early onset   Family History  Problem Relation Age of Onset  . Stroke Maternal Grandmother 57  . Colon polyps Father   . Urolithiasis Father     Had kidney removed at age 80   Social History  Substance Use Topics  . Smoking status: Never Smoker   . Smokeless tobacco: Never Used  . Alcohol Use: 1.8 oz/week    3 Cans of beer per week     Comment: weekends, occ    Review of Systems  Constitutional: Negative for fever, activity change, appetite change, fatigue and unexpected weight change.  Respiratory: Negative for cough, chest tightness, shortness of breath and wheezing.   Cardiovascular: Negative for chest pain, palpitations and leg swelling.  Neurological: Negative for dizziness, weakness and headaches.  Psychiatric/Behavioral: Negative for dysphoric mood. The patient is not nervous/anxious.   All other systems reviewed and are negative.      Objective:    Physical Exam  Constitutional: He is oriented to person, place, and time. He appears well-developed and well-nourished. No distress.  HENT:  Head: Normocephalic and atraumatic.  Nose: Nose normal.  Mouth/Throat: Oropharynx is clear and moist.  Hearing grossly normal.  Eyes: Conjunctivae and EOM are normal. Pupils are equal, round, and reactive to light. No scleral icterus.  Neck: Normal range of motion. Neck supple. No JVD present. No thyromegaly present.  Cardiovascular: Normal rate, regular rhythm, normal heart sounds  and intact distal pulses.  Exam reveals no friction rub.   No murmur heard. No edema.  Pulmonary/Chest: Effort normal and breath sounds normal. No respiratory distress. He has no wheezes.  Abdominal: Soft. Bowel sounds are normal. He exhibits no distension and no mass. There is no tenderness. There is no guarding.  Genitourinary: Rectum normal, prostate normal and penis normal.  Chaperone by L Brand CMA  Musculoskeletal: Normal range of motion. He exhibits no edema or tenderness.  Lymphadenopathy:    He has no cervical adenopathy.  Neurological: He is alert and oriented to person, place, and time. He has normal reflexes. No cranial nerve deficit.  Skin: Skin is warm and dry. No rash noted. No erythema.  Psychiatric: He has a normal mood and affect. His behavior is normal. Thought content normal.    BP 112/72 mmHg  Pulse 66  Temp(Src) 97.9 F (36.6 C) (Oral)  Ht 6' 1"  (1.854 m)  Wt 166 lb (75.297 kg)  BMI 21.91 kg/m2  SpO2 98% Wt Readings from Last 3 Encounters:  08/24/14 166 lb (75.297 kg)  12/08/13 170 lb 6.4 oz (77.293 kg)  08/20/13 161 lb 8 oz (73.256 kg)    Lab Results  Component Value Date   WBC 6.2 06/13/2013   HGB 15.4 06/13/2013   HCT 44.8 06/13/2013   PLT 220.0 06/13/2013   GLUCOSE 104* 06/13/2013   CHOL 179 06/13/2013   TRIG 133.0 06/13/2013   HDL 35.60* 06/13/2013  LDLCALC 117* 06/13/2013   ALT 33 06/13/2013   AST 29 06/13/2013   NA 140 06/13/2013   K 4.4 06/13/2013   CL 103 06/13/2013   CREATININE 0.9 06/13/2013   BUN 12 06/13/2013   CO2 31 06/13/2013   TSH 1.31 06/13/2013   HGBA1C 5.6 01/25/2012    Ct Abdomen Pelvis W Contrast  03/11/2013   CLINICAL DATA:  Low abdominal pain  EXAM: CT ABDOMEN AND PELVIS WITH CONTRAST  TECHNIQUE: Multidetector CT imaging of the abdomen and pelvis was performed using the standard protocol following bolus administration of intravenous contrast.  CONTRAST:  134m OMNIPAQUE IOHEXOL 300 MG/ML  SOLN  COMPARISON:  CT ABD  W/CM dated 05/19/2008  FINDINGS: Lung bases are clear. Liver, adrenal glands, kidneys, spleen, and pancreas are normal. No ascites or lymphadenopathy. No free air or fluid.  Appendix is normal. No bowel wall thickening or focal segmental dilatation. No pelvic free fluid or lymphadenopathy. The bladder is normal. No static evidence for recurrent inguinal hernia.  No acute osseous abnormality.  IMPRESSION: No acute intra-abdominal or pelvic pathology.   Electronically Signed   By: GConchita ParisM.D.   On: 03/11/2013 11:23       Assessment & Plan:   Z00.00/CPX - Patient has been counseled on age-appropriate routine health concerns for screening and prevention. These are reviewed and up-to-date. Immunizations are up-to-date or declined. Labs ordered and reviewed.  Problem List Items Addressed This Visit    None    Visit Diagnoses    Routine general medical examination at a health care facility    -  Primary    Relevant Orders    Basic metabolic panel    CBC with Differential/Platelet    Hepatic function panel    Lipid panel    TSH    [facility]        VGwendolyn Grant MD

## 2014-08-24 NOTE — Progress Notes (Signed)
Pre visit review using our clinic review tool, if applicable. No additional management support is needed unless otherwise documented below in the visit note. 

## 2014-08-25 ENCOUNTER — Other Ambulatory Visit (INDEPENDENT_AMBULATORY_CARE_PROVIDER_SITE_OTHER): Payer: BLUE CROSS/BLUE SHIELD

## 2014-08-25 DIAGNOSIS — Z0000 Encounter for general adult medical examination without abnormal findings: Secondary | ICD-10-CM

## 2014-08-25 LAB — CBC WITH DIFFERENTIAL/PLATELET
BASOS PCT: 0.4 % (ref 0.0–3.0)
Basophils Absolute: 0 10*3/uL (ref 0.0–0.1)
Eosinophils Absolute: 0.1 10*3/uL (ref 0.0–0.7)
Eosinophils Relative: 1.7 % (ref 0.0–5.0)
HCT: 44.5 % (ref 39.0–52.0)
Hemoglobin: 14.9 g/dL (ref 13.0–17.0)
LYMPHS ABS: 1.7 10*3/uL (ref 0.7–4.0)
Lymphocytes Relative: 28.1 % (ref 12.0–46.0)
MCHC: 33.4 g/dL (ref 30.0–36.0)
MCV: 90.7 fl (ref 78.0–100.0)
MONO ABS: 0.6 10*3/uL (ref 0.1–1.0)
Monocytes Relative: 10 % (ref 3.0–12.0)
NEUTROS PCT: 59.8 % (ref 43.0–77.0)
Neutro Abs: 3.6 10*3/uL (ref 1.4–7.7)
Platelets: 211 10*3/uL (ref 150.0–400.0)
RBC: 4.91 Mil/uL (ref 4.22–5.81)
RDW: 13.3 % (ref 11.5–15.5)
WBC: 6 10*3/uL (ref 4.0–10.5)

## 2014-08-25 LAB — BASIC METABOLIC PANEL
BUN: 13 mg/dL (ref 6–23)
CO2: 32 mEq/L (ref 19–32)
Calcium: 9.4 mg/dL (ref 8.4–10.5)
Chloride: 104 mEq/L (ref 96–112)
Creatinine, Ser: 0.98 mg/dL (ref 0.40–1.50)
GFR: 89.95 mL/min (ref >60.00)
GLUCOSE: 97 mg/dL (ref 70–99)
Potassium: 4.4 mEq/L (ref 3.5–5.1)
Sodium: 141 mEq/L (ref 135–145)

## 2014-08-25 LAB — LIPID PANEL
CHOLESTEROL: 160 mg/dL (ref 0–200)
HDL: 29.1 mg/dL — AB (ref >39.00)
LDL CALC: 105 mg/dL — AB (ref 0–99)
NonHDL: 131.37
TRIGLYCERIDES: 130 mg/dL (ref 0.0–149.0)
Total CHOL/HDL Ratio: 6
VLDL: 26 mg/dL (ref 0.0–40.0)

## 2014-08-25 LAB — URINALYSIS, ROUTINE W REFLEX MICROSCOPIC
Bilirubin Urine: NEGATIVE
KETONES UR: NEGATIVE
Leukocytes, UA: NEGATIVE
NITRITE: NEGATIVE
SPECIFIC GRAVITY, URINE: 1.015 (ref 1.000–1.030)
Total Protein, Urine: NEGATIVE
Urine Glucose: NEGATIVE
Urobilinogen, UA: 0.2 (ref 0.0–1.0)
WBC UA: NONE SEEN (ref 0–?)
pH: 6 (ref 5.0–8.0)

## 2014-08-25 LAB — TSH: TSH: 2.07 u[IU]/mL (ref 0.35–4.50)

## 2014-08-25 LAB — HEPATIC FUNCTION PANEL
ALT: 25 U/L (ref 0–53)
AST: 19 U/L (ref 0–37)
Albumin: 4.3 g/dL (ref 3.5–5.2)
Alkaline Phosphatase: 63 U/L (ref 39–117)
BILIRUBIN TOTAL: 0.6 mg/dL (ref 0.2–1.2)
Bilirubin, Direct: 0.1 mg/dL (ref 0.0–0.3)
Total Protein: 6.7 g/dL (ref 6.0–8.3)

## 2014-10-29 ENCOUNTER — Encounter: Payer: Self-pay | Admitting: Family

## 2014-10-29 ENCOUNTER — Ambulatory Visit (INDEPENDENT_AMBULATORY_CARE_PROVIDER_SITE_OTHER): Payer: BLUE CROSS/BLUE SHIELD | Admitting: Family

## 2014-10-29 VITALS — BP 138/80 | HR 86 | Temp 97.8°F | Ht 73.0 in | Wt 168.2 lb

## 2014-10-29 DIAGNOSIS — M62830 Muscle spasm of back: Secondary | ICD-10-CM | POA: Insufficient documentation

## 2014-10-29 MED ORDER — METHOCARBAMOL 500 MG PO TABS: 500.0000 mg | ORAL_TABLET | Freq: Three times a day (TID) | ORAL | Status: DC | PRN

## 2014-10-29 NOTE — Progress Notes (Signed)
   Subjective:    Patient ID: Curtis [name], male    DOB: [birth year], 39 y.o.   MRN: [id number]  Chief Complaint  Patient presents with  . Back Pain    started at 10 pm last night. right side of spine    HPI:  Curtis [name] is a 39 y.o. male who  has a past medical history of ACNE, CYSTIC; INGUINAL HERNIA, RIGHT (04/24/2008); Lactose intolerance; IBS (irritable bowel syndrome); and Arthritis. and presents today for an acute office visit.   This is a new problem. Associated symptom of pain located on the right side of his back that has been going on for about 12 hours. Pain is described dull and achy with occasional stabbing pains . Severity of the pain increases with any type of movement. Modifying factors include 3-4 Tylenol which did not help with his pain. Did go to the gym on Tuesday and performed his usual exercise routine and denies trauma or sounds/sensations heard or felt. Denies saddle anesthesia and or changes to bowel/bladder habits.    No Known Allergies   No current outpatient prescriptions on file prior to visit.   No current facility-administered medications on file prior to visit.    Review of Systems  Constitutional: Negative for fever and chills.  Musculoskeletal: Positive for back pain.  Neurological: Negative for weakness and numbness.      Objective:    BP 138/80 mmHg  Pulse 86  Temp(Src) 97.8 F (36.6 C) (Oral)  Ht 6' 1"  (1.854 m)  Wt 168 lb 4 oz (76.318 kg)  BMI 22.20 kg/m2  SpO2 97% Nursing note and vital signs reviewed.  Physical Exam  Constitutional: He is oriented to person, place, and time. He appears well-developed and well-nourished. No distress.  Cardiovascular: Normal rate, regular rhythm, normal heart sounds and intact distal pulses.   Pulmonary/Chest: Effort normal and breath sounds normal.  Musculoskeletal:  Mid back - no obvious deformity, discoloration, or edema noted. Mild tenderness elicited over latissimus dorsi with  no palpable muscle spasm. Range of motion is within normal limits in all directions. Discomfort experienced in left rotation and lateral bending. Distal pulses are intact and appropriate.  Neurological: He is alert and oriented to person, place, and time.  Skin: Skin is warm and dry.  Psychiatric: He has a normal mood and affect. His behavior is normal. Judgment and thought content normal.       Assessment & Plan:   Problem List Items Addressed This Visit      Other   Back muscle spasm - Primary    Symptoms and exam consistent with latissimus dorsi muscle spasm. Start Robaxin. Continue over-the-counter medications as needed for symptom relief. Recommend heat and stretching multiple times per day. Follow-up if symptoms worsen or fail to improve.      Relevant Medications   methocarbamol (ROBAXIN) 500 MG tablet

## 2014-10-29 NOTE — Patient Instructions (Signed)
Thank you for choosing [facility].  Summary/Instructions:  Heat and stretching multiple times per day. Robaxin as needed.   Your prescription(s) have been submitted to your pharmacy or been printed and provided for you. Please take as directed and contact our office if you believe you are having problem(s) with the medication(s) or have any questions.  If your symptoms worsen or fail to improve, please contact our office for further instruction, or in case of emergency go directly to the emergency room at the closest medical facility.   Muscle Cramps and Spasms Muscle cramps and spasms occur when a muscle or muscles tighten and you have no control over this tightening (involuntary muscle contraction). They are a common problem and can develop in any muscle. The most common place is in the calf muscles of the leg. Both muscle cramps and muscle spasms are involuntary muscle contractions, but they also have differences:   Muscle cramps are sporadic and painful. They may last a few seconds to a quarter of an hour. Muscle cramps are often more forceful and last longer than muscle spasms.  Muscle spasms may or may not be painful. They may also last just a few seconds or much longer. CAUSES  It is uncommon for cramps or spasms to be due to a serious underlying problem. In many cases, the cause of cramps or spasms is unknown. Some common causes are:   Overexertion.   Overuse from repetitive motions (doing the same thing over and over).   Remaining in a certain position for a long period of time.   Improper preparation, form, or technique while performing a sport or activity.   Dehydration.   Injury.   Side effects of some medicines.   Abnormally low levels of the salts and ions in your blood (electrolytes), especially potassium and calcium. This could happen if you are taking water pills (diuretics) or you are pregnant.  Some underlying medical problems can make it more  likely to develop cramps or spasms. These include, but are not limited to:   Diabetes.   Parkinson disease.   Hormone disorders, such as thyroid problems.   Alcohol abuse.   Diseases specific to muscles, joints, and bones.   Blood vessel disease where not enough blood is getting to the muscles.  HOME CARE INSTRUCTIONS   Stay well hydrated. Drink enough water and fluids to keep your urine clear or pale yellow.  It may be helpful to massage, stretch, and relax the affected muscle.  For tight or tense muscles, use a warm towel, heating pad, or hot shower water directed to the affected area.  If you are sore or have pain after a cramp or spasm, applying ice to the affected area may relieve discomfort.  Put ice in a plastic bag.  Place a towel between your skin and the bag.  Leave the ice on for 15-20 minutes, 03-04 times a day.  Medicines used to treat a known cause of cramps or spasms may help reduce their frequency or severity. Only take over-the-counter or prescription medicines as directed by your caregiver. SEEK MEDICAL CARE IF:  Your cramps or spasms get more severe, more frequent, or do not improve over time.  MAKE SURE YOU:   Understand these instructions.  Will watch your condition.  Will get help right away if you are not doing well or get worse.   This information is not intended to replace advice given to you by your health care provider. Make sure you discuss  any questions you have with your health care provider.   Document Released: 06/10/2001 Document Revised: 04/15/2012 Document Reviewed: 12/06/2011 Elsevier Interactive Patient Education [employer].

## 2014-10-29 NOTE — Assessment & Plan Note (Signed)
Symptoms and exam consistent with latissimus dorsi muscle spasm. Start Robaxin. Continue over-the-counter medications as needed for symptom relief. Recommend heat and stretching multiple times per day. Follow-up if symptoms worsen or fail to improve.

## 2014-11-02 ENCOUNTER — Encounter: Payer: Self-pay | Admitting: Family

## 2014-11-02 ENCOUNTER — Telehealth: Payer: Self-pay | Admitting: Internal Medicine

## 2014-11-02 NOTE — Telephone Encounter (Signed)
Please advise 

## 2014-11-02 NOTE — Telephone Encounter (Signed)
Needs work note with limitations faxed to employer at  [phone] Attn: Toribio Harbour.  Requesting a note for a week stating not to lift anything at work.

## 2014-11-02 NOTE — Telephone Encounter (Signed)
Note written and ready to be printed and faxed.

## 2014-11-04 NOTE — Telephone Encounter (Signed)
Letter has been faxed.

## 2014-11-12 ENCOUNTER — Other Ambulatory Visit (INDEPENDENT_AMBULATORY_CARE_PROVIDER_SITE_OTHER): Payer: BLUE CROSS/BLUE SHIELD

## 2014-11-12 ENCOUNTER — Encounter: Payer: Self-pay | Admitting: Family

## 2014-11-12 ENCOUNTER — Telehealth: Payer: Self-pay | Admitting: Internal Medicine

## 2014-11-12 ENCOUNTER — Ambulatory Visit (INDEPENDENT_AMBULATORY_CARE_PROVIDER_SITE_OTHER): Payer: BLUE CROSS/BLUE SHIELD | Admitting: Family

## 2014-11-12 VITALS — BP 140/94 | HR 90 | Temp 97.8°F | Resp 18 | Ht 73.0 in | Wt 161.8 lb

## 2014-11-12 DIAGNOSIS — R5383 Other fatigue: Secondary | ICD-10-CM

## 2014-11-12 DIAGNOSIS — R0602 Shortness of breath: Secondary | ICD-10-CM | POA: Diagnosis not present

## 2014-11-12 LAB — HEMOGLOBIN A1C: Hgb A1c MFr Bld: 5.5 % (ref 4.6–6.5)

## 2014-11-12 LAB — TSH: TSH: 0.85 u[IU]/mL (ref 0.35–4.50)

## 2014-11-12 MED ORDER — ALBUTEROL SULFATE HFA 108 (90 BASE) MCG/ACT IN AERS: 2.0000 | INHALATION_SPRAY | Freq: Four times a day (QID) | RESPIRATORY_TRACT | Status: DC | PRN

## 2014-11-12 NOTE — Assessment & Plan Note (Addendum)
Symptoms of fatigue and excessive daytime sleepiness most likely related to sleep disturbance pattern. Obtain TSH and hemoglobin A1c to rule out metabolic causes. Recommend trial relation to help with sleep. Refer to pulmonology for further assessment of potential sleep apnea.

## 2014-11-12 NOTE — Progress Notes (Signed)
Subjective:    Patient ID: Curtis [name], male    DOB: 09-25-1975, 39 y.o.   MRN: [id number]  Chief Complaint  Patient presents with  . Shortness of Breath    when he goes to bed at night he has trouble breathing and when he wakes up he has more SOB in the morning, no congestion or sinus issues, mouth is dry and throat    HPI:  Curtis [name] is a 39 y.o. male who  has a past medical history of ACNE, CYSTIC; INGUINAL HERNIA, RIGHT (04/24/2008); Lactose intolerance; IBS (irritable bowel syndrome); and Arthritis. and presents today for a follow up office visit.   Associated symptoms of shortness of breath at bedtime without congestion or sinus issues has been going on for the past couple of days. He reports waking up in the morning with shortness of breath, dry mouth and increased thirst. Notes that by mid-morning as he moves around the symptoms improve. Lives in an older house. Describes daytime fatigue and sleepiness. Epworth Sleepiness Scale of 15 and indicates that he does snore at night. No previous history of asthma or other respiratory conditions. Not currently experiencing symptoms of shortness of breath or wheezing.  No Known Allergies   Current Outpatient Prescriptions on File Prior to Visit  Medication Sig Dispense Refill  . methocarbamol (ROBAXIN) 500 MG tablet Take 1 tablet (500 mg total) by mouth 3 (three) times daily as needed for muscle spasms. 60 tablet 0  . tadalafil (CIALIS) 5 MG tablet Take 5 mg by mouth daily as needed for erectile dysfunction.     No current facility-administered medications on file prior to visit.     Review of Systems  Constitutional: Negative for fever and chills.  Respiratory: Positive for shortness of breath and wheezing.   Cardiovascular: Negative for chest pain, palpitations and leg swelling.  Psychiatric/Behavioral: Negative for sleep disturbance.      Objective:    BP 140/94 mmHg  Pulse 90  Temp(Src) 97.8 F (36.6 C)  (Oral)  Resp 18  Ht 6' 1"  (1.854 m)  Wt 161 lb 12.8 oz (73.392 kg)  BMI 21.35 kg/m2  SpO2 95% Nursing note and vital signs reviewed.  Physical Exam  Constitutional: He is oriented to person, place, and time. He appears well-developed and well-nourished. No distress.  HENT:  Right Ear: Hearing, tympanic membrane, external ear and ear canal normal.  Left Ear: Hearing, tympanic membrane, external ear and ear canal normal.  Nose: Nose normal. Right sinus exhibits no maxillary sinus tenderness and no frontal sinus tenderness. Left sinus exhibits no maxillary sinus tenderness and no frontal sinus tenderness.  Mouth/Throat: Uvula is midline, oropharynx is clear and moist and mucous membranes are normal.  Cardiovascular: Normal rate, regular rhythm, normal heart sounds and intact distal pulses.   Pulmonary/Chest: Effort normal and breath sounds normal.  Neurological: He is alert and oriented to person, place, and time.  Skin: Skin is warm and dry.  Psychiatric: He has a normal mood and affect. His behavior is normal. Judgment and thought content normal.       Assessment & Plan:   Problem List Items Addressed This Visit      Other   Fatigue - Primary    Symptoms of fatigue and excessive daytime sleepiness most likely related to sleep disturbance pattern. Obtain TSH and hemoglobin A1c to rule out metabolic causes. Recommend trial relation to help with sleep. Refer to pulmonology for further assessment of potential sleep apnea.  Relevant Orders   Hemoglobin A1c (Completed)   TSH (Completed)   Ambulatory referral to Pulmonology   SOB (shortness of breath)    Shortness of breath and wheezing of undetermined origin and not currently experiencing symptoms. Does snore at night and has an Epworth Sleepiness Scale of 15. Refer to pulmonology for further sleep apnea assessment. Recommend trial of Breathe Right strips to open airway. Start albuterol as needed for wheezing. Follow-up if symptoms  worsen or fail to improve.

## 2014-11-12 NOTE — Telephone Encounter (Signed)
Patient Name: Curtis [name] DOB: [birth year] Initial Comment Caller States he has shortness of breath, esp at night when he lays down, chest pain Nurse Assessment Nurse: Vallery Sa, RN, [name] Date/Time (Eastern Time): 11/12/2014 9:26:45 AM Confirm and document reason for call. If symptomatic, describe symptoms. ---Caller states he developed chest pain (rated as a 1-2 on the 1 to 10 scale) and shortness of breath about 2 weeks ago that is worse today. No injury in the past 3 days. No severe breathing difficulty. Alert and responsive. Has the patient traveled out of the country within the last 30 days? ---No Does the patient have any new or worsening symptoms? ---Yes Will a triage be completed? ---Yes Related visit to physician within the last 2 weeks? ---Yes Does the PT have any chronic conditions? (i.e. diabetes, asthma, etc.) ---No Guidelines Guideline Title Affirmed Question Affirmed Notes Chest Pain [1] Intermittent chest pain or "angina" AND [2] increasing in severity or frequency (Exception: pains lasting a few seconds) Final Disposition User Go to ED Now Vallery Sa, RN, [name] Comments Called the office backline and notified Lovena Le that [name] shares he won't be going to the ER due to work requirements. Referrals GO TO FACILITY REFUSED Disagree/Comply: Disagree Disagree/Comply Reason: Disagree with instructions

## 2014-11-12 NOTE — Patient Instructions (Signed)
Thank you for choosing [facility].  Summary/Instructions:  Your prescription(s) have been submitted to your pharmacy or been printed and provided for you. Please take as directed and contact our office if you believe you are having problem(s) with the medication(s) or have any questions.  If your symptoms worsen or fail to improve, please contact our office for further instruction, or in case of emergency go directly to the emergency room at the closest medical facility.   Fatigue Fatigue is feeling tired all of the time, a lack of energy, or a lack of motivation. Occasional or mild fatigue is often a normal response to activity or life in general. However, long-lasting (chronic) or extreme fatigue may indicate an underlying medical condition. HOME CARE INSTRUCTIONS  Watch your fatigue for any changes. The following actions may help to lessen any discomfort you are feeling:  Talk to your health care provider about how much sleep you need each night. Try to get the required amount every night.  Take medicines only as directed by your health care provider.  Eat a healthy and nutritious diet. Ask your health care provider if you need help changing your diet.  Drink enough fluid to keep your urine clear or pale yellow.  Practice ways of relaxing, such as yoga, meditation, massage therapy, or acupuncture.  Exercise regularly.   Change situations that cause you stress. Try to keep your work and personal routine reasonable.  Do not abuse illegal drugs.  Limit alcohol intake to no more than 1 drink per day for nonpregnant women and 2 drinks per day for men. One drink equals 12 ounces of beer, 5 ounces of wine, or 1 ounces of hard liquor.  Take a multivitamin, if directed by your health care provider. SEEK MEDICAL CARE IF:   Your fatigue does not get better.  You have a fever.   You have unintentional weight loss or gain.  You have headaches.   You have difficulty:    Falling asleep.  Sleeping throughout the night.  You feel angry, guilty, anxious, or sad.   You are unable to have a bowel movement (constipation).   You skin is dry.   Your legs or another part of your body is swollen.  SEEK IMMEDIATE MEDICAL CARE IF:   You feel confused.   Your vision is blurry.  You feel faint or pass out.   You have a severe headache.   You have severe abdominal, pelvic, or back pain.   You have chest pain, shortness of breath, or an irregular or fast heartbeat.   You are unable to urinate or you urinate less than normal.   You develop abnormal bleeding, such as bleeding from the rectum, vagina, nose, lungs, or nipples.  You vomit blood.   You have thoughts about harming yourself or committing suicide.   You are worried that you might harm someone else.    This information is not intended to replace advice given to you by your health care provider. Make sure you discuss any questions you have with your health care provider.   Document Released: 10/16/2006 Document Revised: 01/09/2014 Document Reviewed: 04/22/2013 Elsevier Interactive Patient Education [employer].

## 2014-11-12 NOTE — Assessment & Plan Note (Addendum)
Shortness of breath and wheezing of undetermined origin and not currently experiencing symptoms. Vitals are stable. Does snore at night and has an Epworth Sleepiness Scale of 15. Refer to pulmonology for further sleep apnea assessment. Recommend trial of Breathe Right strips to open airway. Start albuterol as needed for wheezing. Follow-up if symptoms worsen or fail to improve.

## 2014-11-12 NOTE — Progress Notes (Signed)
Pre visit review using our clinic review tool, if applicable. No additional management support is needed unless otherwise documented below in the visit note. 

## 2014-11-12 NOTE — Telephone Encounter (Signed)
Per request of dr Asa Lente, called patient to offer an appt today. Left vm for patient and told him to call us back.

## 2014-11-12 NOTE — Telephone Encounter (Signed)
Please inform patient that his A1c is normal with no indications of diabetes and his thyroid function is also normal. Therefore please continue to use medications as prescribed and follow up with sleep medicine to rule out any sleep apnea. Follow-up with questions or concerns.

## 2014-11-17 NOTE — Telephone Encounter (Signed)
Pt aware of results 

## 2014-12-08 ENCOUNTER — Ambulatory Visit (INDEPENDENT_AMBULATORY_CARE_PROVIDER_SITE_OTHER): Payer: BLUE CROSS/BLUE SHIELD | Admitting: Internal Medicine

## 2014-12-08 ENCOUNTER — Encounter: Payer: Self-pay | Admitting: Internal Medicine

## 2014-12-08 ENCOUNTER — Ambulatory Visit (INDEPENDENT_AMBULATORY_CARE_PROVIDER_SITE_OTHER)
Admission: RE | Admit: 2014-12-08 | Discharge: 2014-12-08 | Disposition: A | Discharge status: Home / Self Care | Payer: BLUE CROSS/BLUE SHIELD | Source: Ambulatory Visit | Attending: Internal Medicine | Admitting: Internal Medicine

## 2014-12-08 VITALS — BP 110/72 | HR 74 | Ht 73.0 in | Wt 165.0 lb

## 2014-12-08 DIAGNOSIS — R091 Pleurisy: Secondary | ICD-10-CM | POA: Diagnosis not present

## 2014-12-08 DIAGNOSIS — G4710 Hypersomnia, unspecified: Secondary | ICD-10-CM

## 2014-12-08 NOTE — Patient Instructions (Addendum)
Please remember to go to the x-ray department downstairs for your tests - we will call you with the results when they are available.  Try to avoid all inhalants if possible   Classic  pain pattern suggests ibs:  Stereotypical,  very limited distribution of pain locations, daytime, not exacerbated by ex or coughing, worse in sitting position, associated with generalized abd bloating, not present supine due to the dome effect of the diaphragm is  canceled in that position. Frequently these patients have had multiple negative GI workups and CT scans.  Treatment consists of avoiding foods that cause gas (especially [place] food, boiled eggs,  beans and raw vegetables like spinach and salads)  and citrucel 1 heaping tsp twice daily with a large glass of water.  Pain should improve w/in 2 weeks and if not then consider further GI work up.     If pain gets worse you will probably need a ct chest - otherwise pulmonary follow up is as needed

## 2014-12-08 NOTE — Progress Notes (Signed)
Subjective:    Patient ID: Curtis [name], male    DOB: 01/27/1975, 39 y.o.   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 SLEEP CONSULT    Pt referred by Dr. Elna Breslow. pt states a couple months ago  the right side of lower shoulder down to his rib cage cramped up. pt states over the coarse of time it has improved but he began to have SOB at night and tightness on his right side. pt states his sleepig pattern is on and off some nights are better than others but he does have night sweats.  pt states at 18 he had a spontaneous colappsed right lung.Epworth score: 9   Cp came on abruptly Oct 29 2014  prior to OV and aware of it daily since then no not as severe > more likely daytime in sitting position not typically waking him, diffused "cramping" not really pleuritic or made worse by cough or deep breath. Good ex tol gym p onset of cp did not recur with ex, cp never supine  onset of sweats x once or twice a week x 10 years not saturating    No obvious other patterns in day to day or daytime variabilty or assoc chronic cough or  subjective wheeze overt sinus or hb symptoms. No unusual exp hx or h/o childhood pna/ asthma or knowledge of premature birth.  Sleeping ok without nocturnal  or early am exacerbation  of respiratory  c/o's or need for noct saba. Also denies any obvious fluctuation of symptoms with weather or environmental changes or other aggravating or alleviating factors except as outlined above   Current Medications, Allergies, Complete Past Medical History, Past Surgical History, Family History, and Social History were reviewed in [employer]  record.  Epworth score 9              Review of Systems  Constitutional: Negative for fever and unexpected weight change.  HENT: Positive for trouble swallowing. Negative for congestion, dental problem, ear pain, nosebleeds, postnasal drip, rhinorrhea, sinus pressure, sneezing and sore throat.   Eyes: Negative for redness and itching.  Respiratory: Positive for chest tightness and shortness of breath. Negative for cough and wheezing.   Cardiovascular: Negative for palpitations and leg swelling.  Gastrointestinal: Negative for nausea and vomiting.  Genitourinary: Negative for dysuria.  Musculoskeletal: Negative for joint swelling.  Skin: Negative for rash.  Neurological: Negative for headaches.  Hematological: Does not bruise/bleed easily.  Psychiatric/Behavioral: Negative for dysphoric mood. The patient is not nervous/anxious.        Objective:   Physical Exam amb wm nad  HEENT: nl dentition, turbinates, and oropharynx. Nl external ear canals without cough reflex Modified Mallampati Score = 1    NECK :  without JVD/Nodes/TM/ nl carotid upstrokes bilaterally   LUNGS: no acc muscle use,  Nl contour chest which is clear to A and P bilaterally without cough on insp or exp maneuvers   CV:  RRR  no s3 or murmur or increase in P2, no edema   ABD:  soft and nontender with nl inspiratory excursion in the supine position.  No bruits or organomegaly, bowel sounds nl  MS:  Nl gait/ ext warm without deformities, calf tenderness, cyanosis or clubbing No obvious joint restrictions   SKIN: warm and dry without lesions    NEURO:  alert, approp, nl sensorium with  no motor deficits     CXR PA and Lateral:   12/08/2014 :    I personally reviewed images and agree with radiology impression as follows:    There is no evidence of a recurrent pneumothorax. There is stable apical pleural scarring bilaterally. There is no pneumonia, CHF, nor other acute cardiopulmonary disease.        Assessment & Plan:

## 2014-12-09 ENCOUNTER — Encounter: Payer: Self-pay | Admitting: Internal Medicine

## 2014-12-09 ENCOUNTER — Telehealth: Payer: Self-pay | Admitting: Internal Medicine

## 2014-12-09 DIAGNOSIS — G4710 Hypersomnia, unspecified: Secondary | ICD-10-CM | POA: Insufficient documentation

## 2014-12-09 NOTE — Telephone Encounter (Signed)
Result Note     Call pt: Reviewed cxr and no acute change so no change in recommendations made at ov  ---   ATC pt. Received message the person I am trying to reach is not accepting calls at this time. WCB.

## 2014-12-09 NOTE — Assessment & Plan Note (Signed)
Epworth 9 in pulmonary clinic 12/08/2014 but reported 15 previous to it   He has poor sleep hygiene and very high level of anxiety but not evidence or risk for osa at this point   I can refer him to one of our new sleep specialists p they arive if this problem becomes a chronic or recurrent theme but for now did not rec further w/u/ did review optimal sleep hygiene

## 2014-12-09 NOTE — Assessment & Plan Note (Addendum)
The pain does not have typical pleuritic features and is much more likely due to IBS for which he already has a dx or MSCP which is suggested by the acute onset and gradual improvement but does not need further w/u at this point   rec diet/ trial of citrucel.  No evidence of recurrent ptx on R which would be unlikely p 2 thoracotomies with extensive adhesions likely but if pain recurs on rx for ibs then CT chest might be considered

## 2014-12-10 NOTE — Telephone Encounter (Signed)
Patient will be at work, please return call [phone].

## 2014-12-10 NOTE — Telephone Encounter (Signed)
Spoke with pt, aware of results/recs.  Nothing further needed.  

## 2014-12-11 ENCOUNTER — Institutional Professional Consult (permissible substitution): Payer: BLUE CROSS/BLUE SHIELD | Admitting: Pulmonary Disease

## 2015-08-23 ENCOUNTER — Encounter: Payer: Self-pay | Admitting: Family

## 2015-08-23 ENCOUNTER — Other Ambulatory Visit (INDEPENDENT_AMBULATORY_CARE_PROVIDER_SITE_OTHER): Payer: 59

## 2015-08-23 ENCOUNTER — Ambulatory Visit (INDEPENDENT_AMBULATORY_CARE_PROVIDER_SITE_OTHER): Payer: 59 | Admitting: Family

## 2015-08-23 DIAGNOSIS — Z0000 Encounter for general adult medical examination without abnormal findings: Secondary | ICD-10-CM

## 2015-08-23 DIAGNOSIS — Z0001 Encounter for general adult medical examination with abnormal findings: Secondary | ICD-10-CM | POA: Insufficient documentation

## 2015-08-23 LAB — URINALYSIS
BILIRUBIN URINE: NEGATIVE
KETONES UR: NEGATIVE
Leukocytes, UA: NEGATIVE
NITRITE: NEGATIVE
PH: 6 (ref 5.0–8.0)
Specific Gravity, Urine: 1.01 (ref 1.000–1.030)
TOTAL PROTEIN, URINE-UPE24: NEGATIVE
URINE GLUCOSE: NEGATIVE
Urobilinogen, UA: 0.2 (ref 0.0–1.0)

## 2015-08-23 LAB — LIPID PANEL
CHOL/HDL RATIO: 6
Cholesterol: 168 mg/dL (ref 0–200)
HDL: 29.7 mg/dL — AB (ref >39.00)
LDL CALC: 109 mg/dL — AB (ref 0–99)
NONHDL: 138.24
TRIGLYCERIDES: 148 mg/dL (ref 0.0–149.0)
VLDL: 29.6 mg/dL (ref 0.0–40.0)

## 2015-08-23 LAB — COMPREHENSIVE METABOLIC PANEL
ALT: 31 U/L (ref 0–53)
AST: 20 U/L (ref 0–37)
Albumin: 4.4 g/dL (ref 3.5–5.2)
Alkaline Phosphatase: 70 U/L (ref 39–117)
BUN: 12 mg/dL (ref 6–23)
CALCIUM: 8.9 mg/dL (ref 8.4–10.5)
CHLORIDE: 103 meq/L (ref 96–112)
CO2: 30 meq/L (ref 19–32)
Creatinine, Ser: 1.03 mg/dL (ref 0.40–1.50)
GFR: 84.51 mL/min (ref >60.00)
GLUCOSE: 107 mg/dL — AB (ref 70–99)
Potassium: 4 mEq/L (ref 3.5–5.1)
Sodium: 139 mEq/L (ref 135–145)
Total Bilirubin: 0.6 mg/dL (ref 0.2–1.2)
Total Protein: 7 g/dL (ref 6.0–8.3)

## 2015-08-23 LAB — CBC
HCT: 42 % (ref 39.0–52.0)
HEMOGLOBIN: 14.4 g/dL (ref 13.0–17.0)
MCHC: 34.3 g/dL (ref 30.0–36.0)
MCV: 88.8 fl (ref 78.0–100.0)
PLATELETS: 212 10*3/uL (ref 150.0–400.0)
RBC: 4.74 Mil/uL (ref 4.22–5.81)
RDW: 13.1 % (ref 11.5–15.5)
WBC: 5.8 10*3/uL (ref 4.0–10.5)

## 2015-08-23 NOTE — Patient Instructions (Signed)
Thank you for choosing [facility].  Summary/Instructions:  Please continue to take your probiotic.   Your prescription(s) have been submitted to your pharmacy or been printed and provided for you. Please take as directed and contact our office if you believe you are having problem(s) with the medication(s) or have any questions.  Please stop by the lab on the lower level of the building for your blood work. Your results will be released to [facility] (or called to you) after review, usually within 72 hours after test completion. If any changes need to be made, you will be notified at that same time.  1. The lab is open from 7:30am to 5:30 pm Monday-Friday  2. No appointment is necessary  3. Fasting (if needed) is 6-8 hours after food and drink; black  coffee and water are okay   Health Maintenance, Male A healthy lifestyle and preventative care can promote health and wellness.  Maintain regular health, dental, and eye exams.  Eat a healthy diet. Foods like vegetables, fruits, whole grains, low-fat dairy products, and lean protein foods contain the nutrients you need and are low in calories. Decrease your intake of foods high in solid fats, added sugars, and salt. Get information about a proper diet from your health care provider, if necessary.  Regular physical exercise is one of the most important things you can do for your health. Most adults should get at least 150 minutes of moderate-intensity exercise (any activity that increases your heart rate and causes you to sweat) each week. In addition, most adults need muscle-strengthening exercises on 2 or more days a week.   Maintain a healthy weight. The body mass index (BMI) is a screening tool to identify possible weight problems. It provides an estimate of body fat based on height and weight. Your health care provider can find your BMI and can help you achieve or maintain a healthy weight. For males 20 years and older:  A BMI below  18.5 is considered underweight.  A BMI of 18.5 to 24.9 is normal.  A BMI of 25 to 29.9 is considered overweight.  A BMI of 30 and above is considered obese.  Maintain normal blood lipids and cholesterol by exercising and minimizing your intake of saturated fat. Eat a balanced diet with plenty of fruits and vegetables. Blood tests for lipids and cholesterol should begin at age 57 and be repeated every 5 years. If your lipid or cholesterol levels are high, you are over age 14, or you are at high risk for heart disease, you may need your cholesterol levels checked more frequently.Ongoing high lipid and cholesterol levels should be treated with medicines if diet and exercise are not working.  If you smoke, find out from your health care provider how to quit. If you do not use tobacco, do not start.  Lung cancer screening is recommended for adults aged 4-80 years who are at high risk for developing lung cancer because of a history of smoking. A yearly low-dose CT scan of the lungs is recommended for people who have at least a 30-pack-year history of smoking and are current smokers or have quit within the past 15 years. A pack year of smoking is smoking an average of 1 pack of cigarettes a day for 1 year (for example, a 30-pack-year history of smoking could mean smoking 1 pack a day for 30 years or 2 packs a day for 15 years). Yearly screening should continue until the smoker has stopped smoking for at  least 15 years. Yearly screening should be stopped for people who develop a health problem that would prevent them from having lung cancer treatment.  If you choose to drink alcohol, do not have more than 2 drinks per day. One drink is considered to be 12 oz (360 mL) of beer, 5 oz (150 mL) of wine, or 1.5 oz (45 mL) of liquor.  Avoid the use of street drugs. Do not share needles with anyone. Ask for help if you need support or instructions about stopping the use of drugs.  High blood pressure causes  heart disease and increases the risk of stroke. High blood pressure is more likely to develop in:  People who have blood pressure in the end of the normal range (100-139/85-89 mm Hg).  People who are overweight or obese.  People who are African American.  If you are 91-50 years of age, have your blood pressure checked every 3-5 years. If you are 31 years of age or older, have your blood pressure checked every year. You should have your blood pressure measured twice--once when you are at a hospital or clinic, and once when you are not at a hospital or clinic. Record the average of the two measurements. To check your blood pressure when you are not at a hospital or clinic, you can use:  An automated blood pressure machine at a pharmacy.  A home blood pressure monitor.  If you are 82-69 years old, ask your health care provider if you should take aspirin to prevent heart disease.  Diabetes screening involves taking a blood sample to check your fasting blood sugar level. This should be done once every 3 years after age 61 if you are at a normal weight and without risk factors for diabetes. Testing should be considered at a younger age or be carried out more frequently if you are overweight and have at least 1 risk factor for diabetes.  Colorectal cancer can be detected and often prevented. Most routine colorectal cancer screening begins at the age of 8 and continues through age 85. However, your health care provider may recommend screening at an earlier age if you have risk factors for colon cancer. On a yearly basis, your health care provider may provide home test kits to check for hidden blood in the stool. A small camera at the end of a tube may be used to directly examine the colon (sigmoidoscopy or colonoscopy) to detect the earliest forms of colorectal cancer. Talk to your health care provider about this at age 22 when routine screening begins. A direct exam of the colon should be repeated every  5-10 years through age 29, unless early forms of precancerous polyps or small growths are found.  People who are at an increased risk for hepatitis B should be screened for this virus. You are considered at high risk for hepatitis B if:  You were born in a country where hepatitis B occurs often. Talk with your health care provider about which countries are considered high risk.  Your parents were born in a high-risk country and you have not received a shot to protect against hepatitis B (hepatitis B vaccine).  You have HIV or AIDS.  You use needles to inject street drugs.  You live with, or have sex with, someone who has hepatitis B.  You are a man who has sex with other men (MSM).  You get hemodialysis treatment.  You take certain medicines for conditions like cancer, organ transplantation, and autoimmune conditions.  Hepatitis C blood testing is recommended for all people born from [birth year] through 1966 and any individual with known risk factors for hepatitis C.  Healthy men should no longer receive prostate-specific antigen (PSA) blood tests as part of routine cancer screening. Talk to your health care provider about prostate cancer screening.  Testicular cancer screening is not recommended for adolescents or adult males who have no symptoms. Screening includes self-exam, a health care provider exam, and other screening tests. Consult with your health care provider about any symptoms you have or any concerns you have about testicular cancer.  Practice safe sex. Use condoms and avoid high-risk sexual practices to reduce the spread of sexually transmitted infections (STIs).  You should be screened for STIs, including gonorrhea and chlamydia if:  You are sexually active and are younger than 24 years.  You are older than 24 years, and your health care provider tells you that you are at risk for this type of infection.  Your sexual activity has changed since you were last screened, and  you are at an increased risk for chlamydia or gonorrhea. Ask your health care provider if you are at risk.  If you are at risk of being infected with HIV, it is recommended that you take a prescription medicine daily to prevent HIV infection. This is called pre-exposure prophylaxis (PrEP). You are considered at risk if:  You are a man who has sex with other men (MSM).  You are a heterosexual man who is sexually active with multiple partners.  You take drugs by injection.  You are sexually active with a partner who has HIV.  Talk with your health care provider about whether you are at high risk of being infected with HIV. If you choose to begin PrEP, you should first be tested for HIV. You should then be tested every 3 months for as long as you are taking PrEP.  Use sunscreen. Apply sunscreen liberally and repeatedly throughout the day. You should seek shade when your shadow is shorter than you. Protect yourself by wearing long sleeves, pants, a wide-brimmed hat, and sunglasses year round whenever you are outdoors.  Tell your health care provider of new moles or changes in moles, especially if there is a change in shape or color. Also, tell your health care provider if a mole is larger than the size of a pencil eraser.  A one-time screening for abdominal aortic aneurysm (AAA) and surgical repair of large AAAs by ultrasound is recommended for men aged 41-75 years who are current or former smokers.  Stay current with your vaccines (immunizations).   This information is not intended to replace advice given to you by your health care provider. Make sure you discuss any questions you have with your health care provider.   Document Released: 06/17/2007 Document Revised: 01/09/2014 Document Reviewed: 05/16/2010 Elsevier Interactive Patient Education [employer].

## 2015-08-23 NOTE — Progress Notes (Signed)
Subjective:    Patient ID: Curtis [name], male    DOB: [birth year], 40 y.o.   MRN: [id number]  Chief Complaint  Patient presents with  . CPE    fasting    HPI:  Curtis [name] is a 40 y.o. male who presents today for an annual wellness visit.   1) Health Maintenance -   Diet - Averages about 3 meals per day consisting of a regular diet; No caffeine intake  Exercise - 2-3x per week at the gym and play basketball.    2) Preventative Exams / Immunizations:  Dental -- Up to date  Vision -- Up to date   Health Maintenance  Topic Date Due  . INFLUENZA VACCINE  08/03/2015  . TETANUS/TDAP  01/03/2016  . HIV Screening  Completed    Immunization History  Administered Date(s) Administered  . Influenza Split 12/07/2010, 12/05/2011  . Influenza,inj,Quad PF,36+ Mos 08/20/2013  . Td 01/02/2006    No Known Allergies   Outpatient Medications Prior to Visit  Medication Sig Dispense Refill  . methocarbamol (ROBAXIN) 500 MG tablet Take 1 tablet (500 mg total) by mouth 3 (three) times daily as needed for muscle spasms. 60 tablet 0  . albuterol (PROVENTIL HFA;VENTOLIN HFA) 108 (90 BASE) MCG/ACT inhaler Inhale 2 puffs into the lungs every 6 (six) hours as needed for wheezing or shortness of breath. 1 Inhaler 0  . tadalafil (CIALIS) 5 MG tablet Take 5 mg by mouth daily as needed for erectile dysfunction.     No facility-administered medications prior to visit.      Past Medical History:  Diagnosis Date  . ACNE, CYSTIC    resolved  . Arthritis    spine, early onset  . IBS (irritable bowel syndrome)   . INGUINAL HERNIA, RIGHT 04/24/2008   s/p RIH repair  . Lactose intolerance    diet controlled     Past Surgical History:  Procedure Laterality Date  . HERNIA REPAIR Right 2010   Dr. Grandville Silos  . Surgery for spont pneumothorax Right 1996     Family History  Problem Relation Age of Onset  . Colon polyps Father   . Urolithiasis Father     Had kidney  removed at age 29  . Stroke Maternal Grandmother 75     Social History   Social History  . Marital status: Single    Spouse name: N/A  . Number of children: 0  . Years of education: 15   Occupational History  . copy room [employer]   Social History Main Topics  . Smoking status: Never Smoker  . Smokeless tobacco: Never Used  . Alcohol use 1.8 oz/week    3 Cans of beer per week     Comment: Occasinal  . Drug use:     Types: Marijuana     Comment: MJ-occassional  . Sexual activity: Not on file   Other Topics Concern  . Not on file   Social History Narrative   Fun: Play basketball,golf, binge watching      Review of Systems  Constitutional: Denies fever, chills, fatigue, or significant weight gain/loss. HENT: Head: Denies headache or neck pain Ears: Denies changes in hearing, ringing in ears, earache, drainage Nose: Denies discharge, stuffiness, itching, nosebleed, sinus pain Throat: Denies sore throat, hoarseness, dry mouth, sores, thrush Eyes: Denies loss/changes in vision, pain, redness, blurry/double vision, flashing lights Cardiovascular: Denies chest pain/discomfort, tightness, palpitations, shortness of breath with activity, difficulty lying down, swelling, sudden awakening with  shortness of breath Respiratory: Denies shortness of breath, cough, sputum production, wheezing Gastrointestinal: Denies dysphasia, heartburn, change in appetite, nausea, change in bowel habits, rectal bleeding, constipation, diarrhea, yellow skin or eyes Genitourinary: Denies frequency, urgency, burning/pain, blood in urine, incontinence, change in urinary strength. Musculoskeletal: Denies muscle/joint pain, stiffness, back pain, redness or swelling of joints, trauma Skin: Denies rashes, lumps, itching, dryness, color changes, or hair/nail changes Neurological: Denies dizziness, fainting, seizures, weakness, numbness, tingling, tremor Psychiatric - Denies nervousness, stress, depression or  memory loss Endocrine: Denies heat or cold intolerance, sweating, frequent urination, excessive thirst, changes in appetite Hematologic: Denies ease of bruising or bleeding     Objective:     BP 122/86 (BP Location: Left Arm, Patient Position: Sitting, Cuff Size: Normal)   Pulse 61   Temp 97.8 F (36.6 C) (Oral)   Resp 16   Ht 6' 1"  (1.854 m)   Wt 169 lb 6.4 oz (76.8 kg)   SpO2 96%   BMI 22.35 kg/m  Nursing note and vital signs reviewed.  Physical Exam  Constitutional: He is oriented to person, place, and time. He appears well-developed and well-nourished.  HENT:  Head: Normocephalic.  Right Ear: Hearing, tympanic membrane, external ear and ear canal normal.  Left Ear: Hearing, tympanic membrane, external ear and ear canal normal.  Nose: Nose normal.  Mouth/Throat: Uvula is midline, oropharynx is clear and moist and mucous membranes are normal.  Eyes: Conjunctivae and EOM are normal. Pupils are equal, round, and reactive to light.  Neck: Neck supple. No JVD present. No tracheal deviation present. No thyromegaly present.  Cardiovascular: Normal rate, regular rhythm, normal heart sounds and intact distal pulses.   Pulmonary/Chest: Effort normal and breath sounds normal.  Abdominal: Soft. Bowel sounds are normal. He exhibits no distension and no mass. There is no tenderness. There is no rebound and no guarding.  Musculoskeletal: Normal range of motion. He exhibits no edema or tenderness.  Lymphadenopathy:    He has no cervical adenopathy.  Neurological: He is alert and oriented to person, place, and time. He has normal reflexes. No cranial nerve deficit. He exhibits normal muscle tone. Coordination normal.  Skin: Skin is warm and dry.  Psychiatric: He has a normal mood and affect. His behavior is normal. Judgment and thought content normal.       Assessment & Plan:   Problem List Items Addressed This Visit      Other   Routine general medical examination at a health care  facility    1) Anticipatory Guidance: Discussed importance of wearing a seatbelt while driving and not texting while driving; changing batteries in smoke detector at least once annually; wearing suntan lotion when outside; eating a balanced and moderate diet; getting physical activity at least 30 minutes per day.  2) Immunizations / Screenings / Labs:  All immunizations are up-to-date per recommendations. All screenings are up-to-date per recommendations. Obtain CBC, CMET, Lipid profile and urinalysis.   Overall well exam with risk factors for cardiovascular disease being minimal at this time. He is of good weight and he is a balanced, moderate, and varied diet. No tobacco use. Continue healthy lifestyle behaviors and choices. Follow-up prevention exam in 1 year. Follow-up office visit pending blood work as necessary.       Relevant Orders   CBC (Completed)   Comprehensive metabolic panel (Completed)   Lipid panel (Completed)   Urinalysis (Completed)    Other Visit Diagnoses   None.      I have  discontinued Mr. Cara's tadalafil and albuterol. I am also having him maintain his methocarbamol.   No orders of the defined types were placed in this encounter.    Follow-up: Return if symptoms worsen or fail to improve.   Mauricio Po, FNP

## 2015-08-23 NOTE — Assessment & Plan Note (Addendum)
1) Anticipatory Guidance: Discussed importance of wearing a seatbelt while driving and not texting while driving; changing batteries in smoke detector at least once annually; wearing suntan lotion when outside; eating a balanced and moderate diet; getting physical activity at least 30 minutes per day.  2) Immunizations / Screenings / Labs:  All immunizations are up-to-date per recommendations. All screenings are up-to-date per recommendations. Obtain CBC, CMET, Lipid profile and urinalysis.   Overall well exam with risk factors for cardiovascular disease being minimal at this time. He is of good weight and he is a balanced, moderate, and varied diet. No tobacco use. Continue healthy lifestyle behaviors and choices. Follow-up prevention exam in 1 year. Follow-up office visit pending blood work as necessary.

## 2015-09-16 IMAGING — CT CT ABD-PELV W/ CM
2 of 4 series · 17 of 46 positions shown, 19 images · IV contrast (Omnipaque 300)
Comparison: CT ABD W/CM dated 05/19/2008

CLINICAL DATA: Low abdominal pain

EXAM:
CT ABDOMEN AND PELVIS WITH CONTRAST
TECHNIQUE: Multidetector CT imaging of the abdomen and pelvis was performed
using the standard protocol following bolus administration of
intravenous contrast.
CONTRAST:  100mL OMNIPAQUE IOHEXOL 300 MG/ML  SOLN

[Series 2: abd/ pel 5mm · axial · 0.66mm/px · z∈[-520,-74]mm · 14 of 97 slices shown, 16 images]
[im 4/97  soft-tissue]
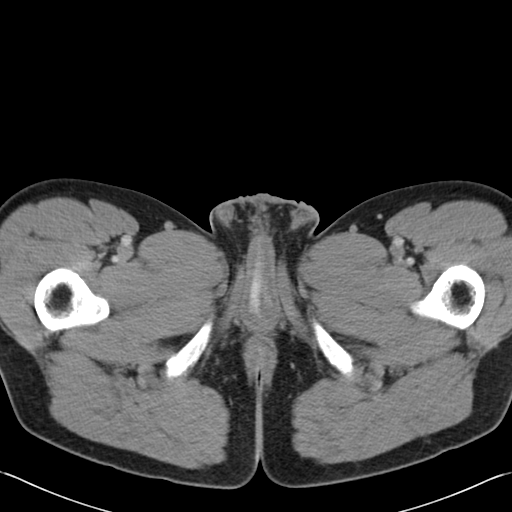
[im 4/97  bone]
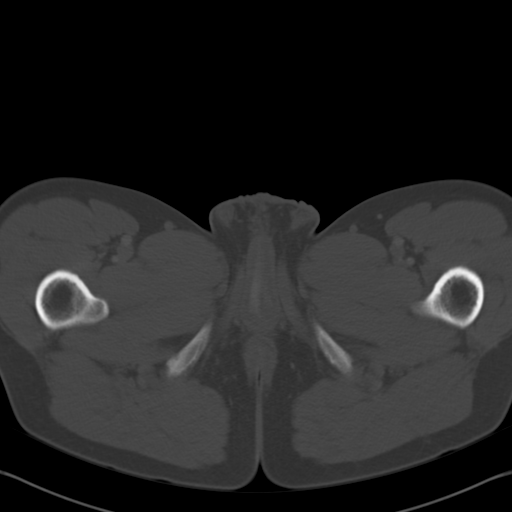
[im 12/97  soft-tissue]
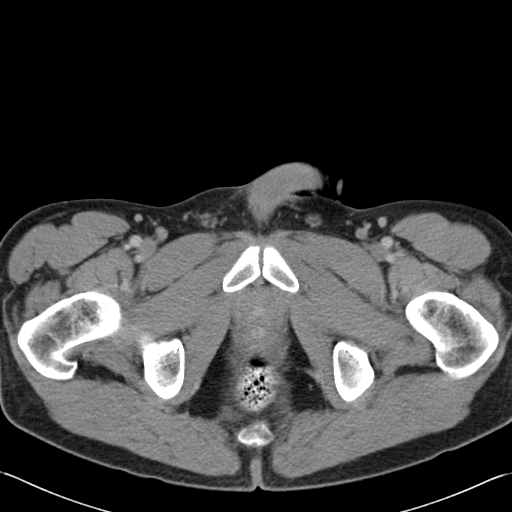
[im 20/97  soft-tissue]
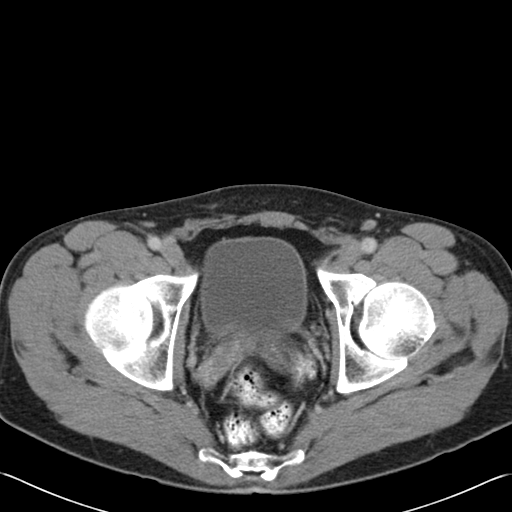
[im 27/97  soft-tissue]
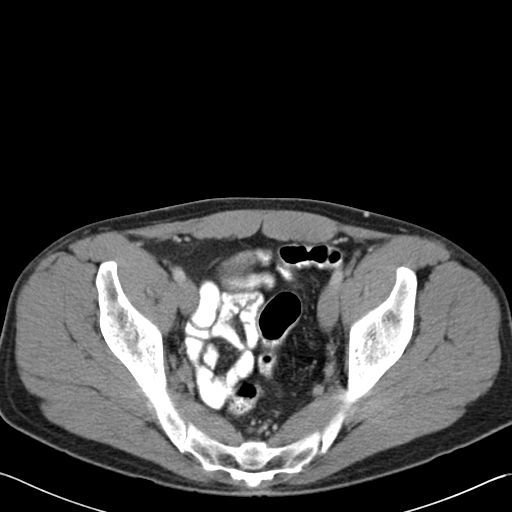
[im 31/97  soft-tissue]
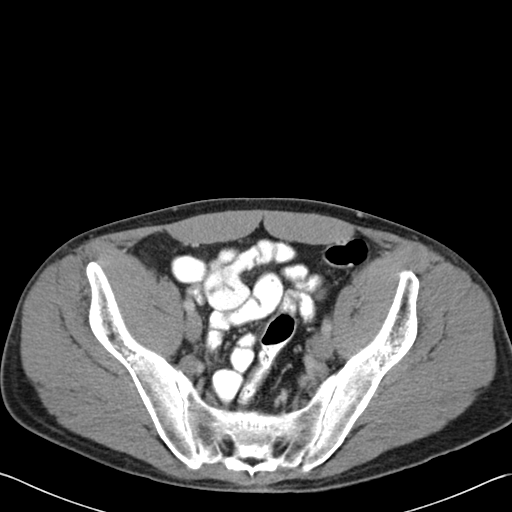
[im 39/97  soft-tissue]
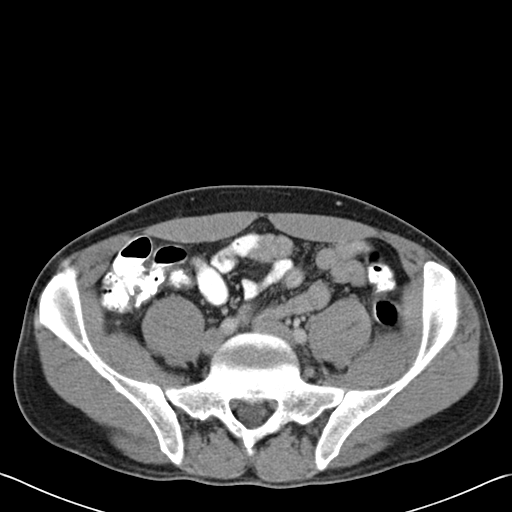
[im 47/97  soft-tissue]
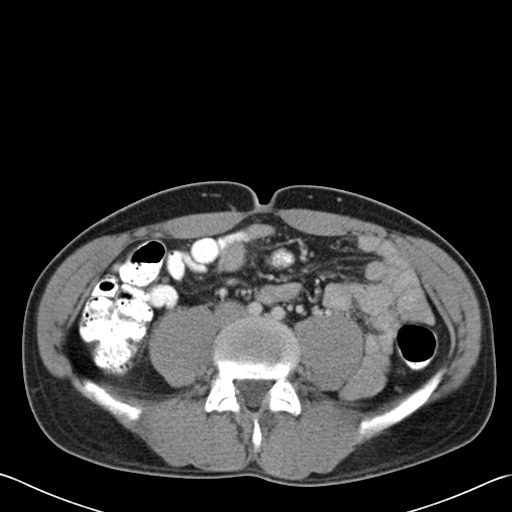
[im 50/97  soft-tissue]
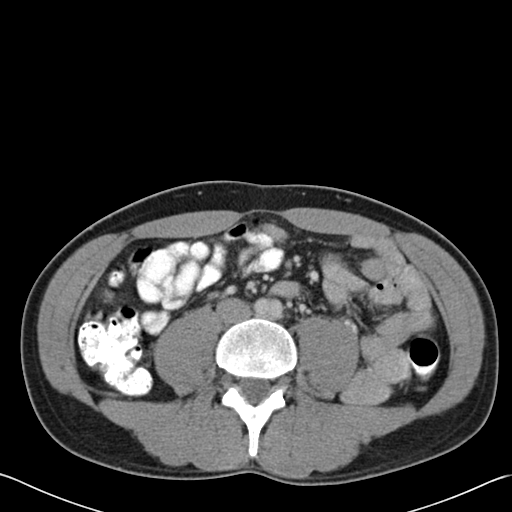
[im 58/97  soft-tissue]
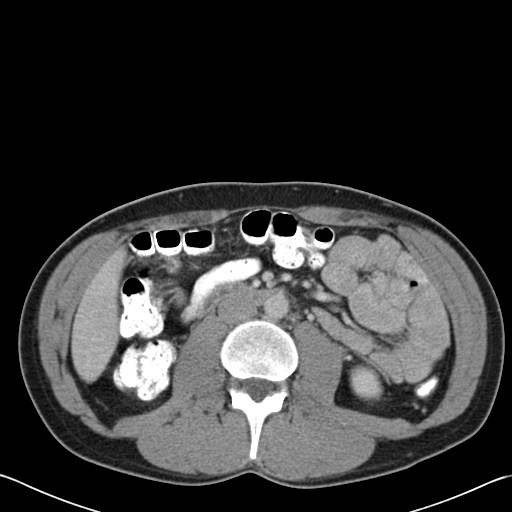
[im 58/97  bone]
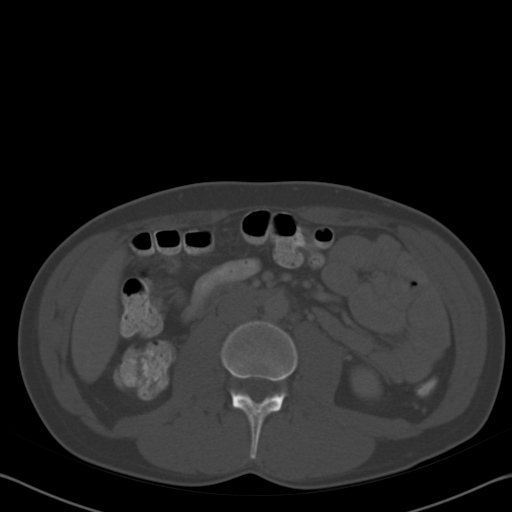
[im 66/97  soft-tissue]
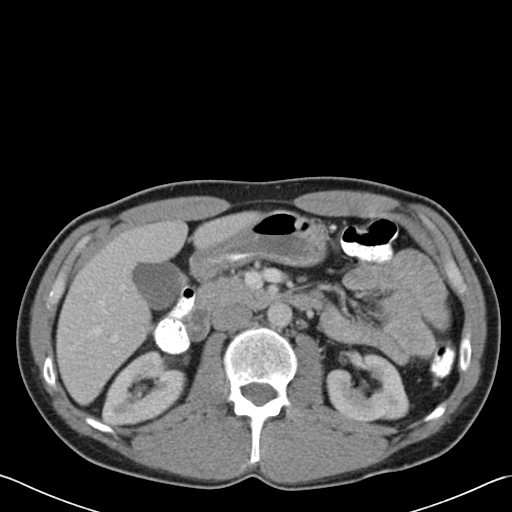
[im 73/97  soft-tissue]
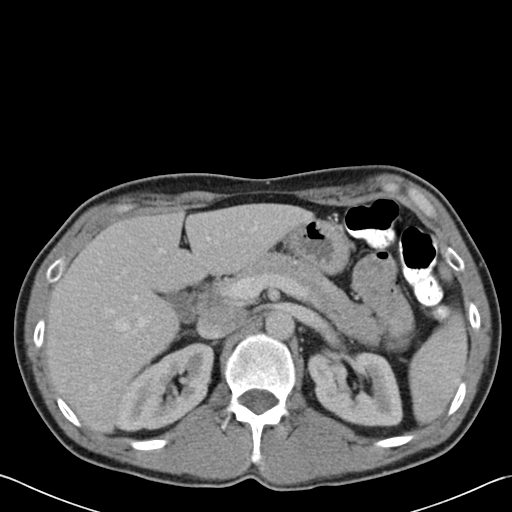
[im 77/97  soft-tissue]
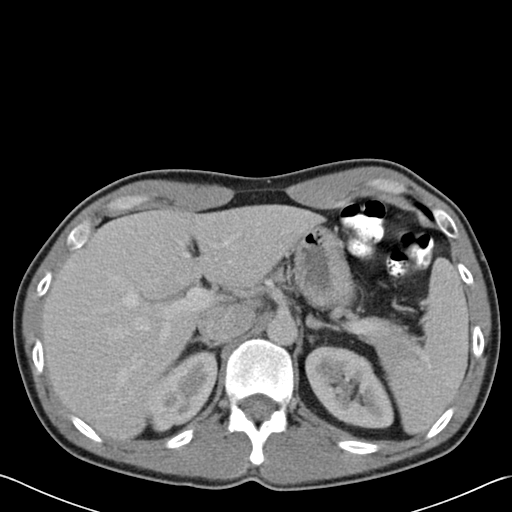
[im 85/97  soft-tissue]
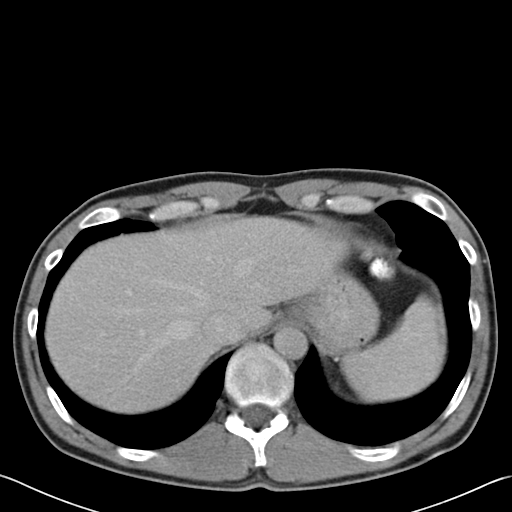
[im 93/97  soft-tissue]
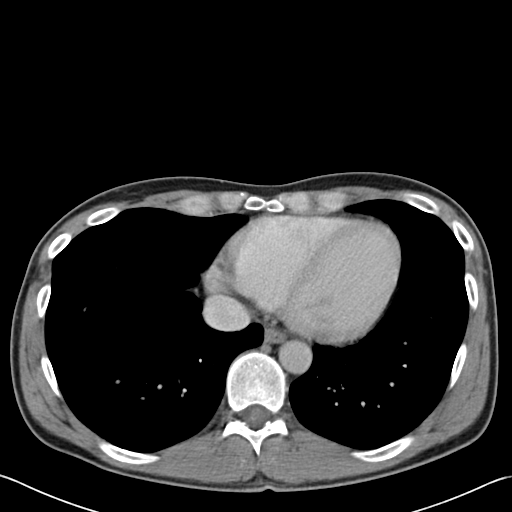

[Series 602: cor · coronal · 0.97mm/px · 3 of 98 slices shown]
[im 33/98  soft-tissue]
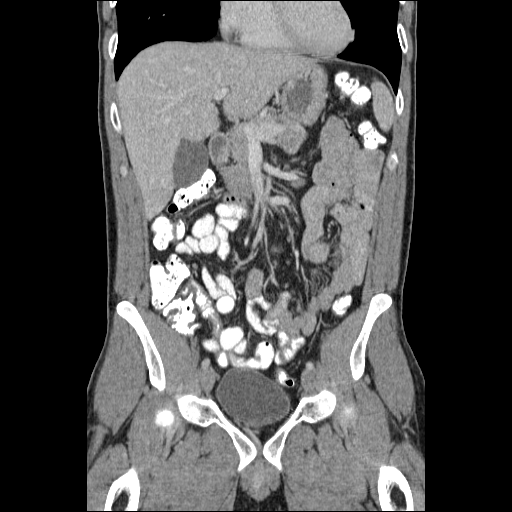
[im 44/98  soft-tissue]
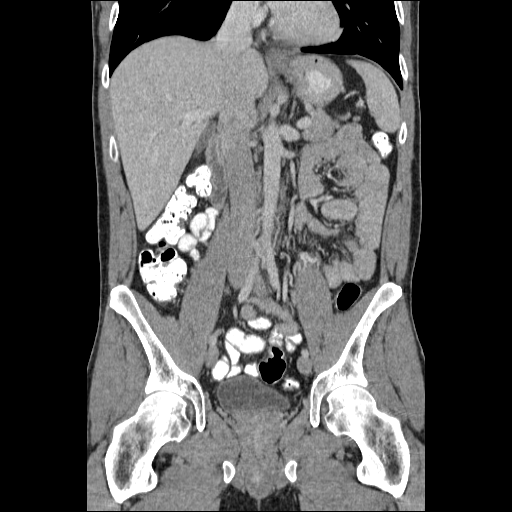
[im 54/98  soft-tissue]
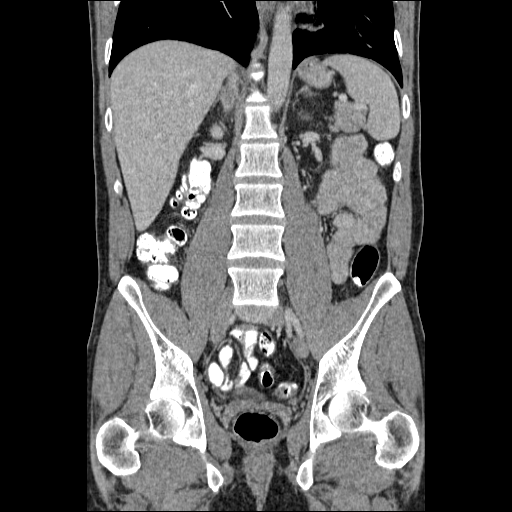

[17 of 46 positions shown; findings below may reference images not displayed]

FINDINGS: Lung bases are clear. Liver, adrenal glands, kidneys, spleen, and
pancreas are normal. No ascites or lymphadenopathy. No free air or
fluid.

Appendix is normal. No bowel wall thickening or focal segmental
dilatation. No pelvic free fluid or lymphadenopathy. The bladder is
normal. No static evidence for recurrent inguinal hernia.

No acute osseous abnormality.
IMPRESSION: No acute intra-abdominal or pelvic pathology.

## 2016-01-03 HISTORY — PX: COLONOSCOPY: SHX174

## 2016-01-11 ENCOUNTER — Encounter: Payer: Self-pay | Admitting: Family

## 2016-01-11 ENCOUNTER — Ambulatory Visit (INDEPENDENT_AMBULATORY_CARE_PROVIDER_SITE_OTHER): Payer: BLUE CROSS/BLUE SHIELD | Admitting: Family

## 2016-01-11 VITALS — BP 134/90 | HR 66 | Temp 97.8°F | Resp 16 | Ht 73.0 in | Wt 170.0 lb

## 2016-01-11 DIAGNOSIS — K580 Irritable bowel syndrome with diarrhea: Secondary | ICD-10-CM | POA: Diagnosis not present

## 2016-01-11 NOTE — Patient Instructions (Addendum)
Thank you for choosing [facility].  SUMMARY AND INSTRUCTIONS:  This appears to be an exacerbation of your IBS.   Please follow a low FODMOP diet and follow up if symptoms do not improve or worsen for potential stool culture or referral to gastroenterology.  Follow up:  If your symptoms worsen or fail to improve, please contact our office for further instruction, or in case of emergency go directly to the emergency room at the closest medical facility.    Diet for Irritable Bowel Syndrome Introduction When you have irritable bowel syndrome (IBS), the foods you eat and your eating habits are very important. IBS may cause various symptoms, such as abdominal pain, constipation, or diarrhea. Choosing the right foods can help ease discomfort caused by these symptoms. Work with your health care provider and dietitian to find the best eating plan to help control your symptoms. What general guidelines do I need to follow?  Keep a food diary. This will help you identify foods that cause symptoms. Write down:  What you eat and when.  What symptoms you have.  When symptoms occur in relation to your meals.  Avoid foods that cause symptoms. Talk with your dietitian about other ways to get the same nutrients that are in these foods.  Eat more foods that contain fiber. Take a fiber supplement if directed by your dietitian.  Eat your meals slowly, in a relaxed setting.  Aim to eat 5-6 small meals per day. Do not skip meals.  Drink enough fluids to keep your urine clear or pale yellow.  Ask your health care provider if you should take an over-the-counter probiotic during flare-ups to help restore healthy gut bacteria.  If you have cramping or diarrhea, try making your meals low in fat and high in carbohydrates. Examples of carbohydrates are pasta, rice, whole grain breads and cereals, fruits, and vegetables.  If dairy products cause your symptoms to flare up, try eating less of them.  You might be able to handle yogurt better than other dairy products because it contains bacteria that help with digestion. What foods are not recommended? The following are some foods and drinks that may worsen your symptoms:  Fatty foods, such as [place] fries.  Milk products, such as cheese or ice cream.  Chocolate.  Alcohol.  Products with caffeine, such as coffee.  Carbonated drinks, such as soda. The items listed above may not be a complete list of foods and beverages to avoid. Contact your dietitian for more information.  What foods are good sources of fiber? Your health care provider or dietitian may recommend that you eat more foods that contain fiber. Fiber can help reduce constipation and other IBS symptoms. Add foods with fiber to your diet a little at a time so that your body can get used to them. Too much fiber at once might cause gas and swelling of your abdomen. The following are some foods that are good sources of fiber:  Apples.  Peaches.  Pears.  Berries.  Figs.  Broccoli (raw).  Cabbage.  Carrots.  Raw peas.  Kidney beans.  Lima beans.  Whole grain bread.  Whole grain cereal. Where to find more information: [employer] for Functional Gastrointestinal Disorders: www.iffgd.[employer] of Diabetes and Digestive and Kidney Diseases: NetworkAffair.co.za.aspx This information is not intended to replace advice given to you by your health care provider. Make sure you discuss any questions you have with your health care provider. Document Released: 03/11/2003 Document Revised: 05/27/2015 Document Reviewed:  03/21/2013  2017 Elsevier

## 2016-01-11 NOTE — Assessment & Plan Note (Addendum)
Symptoms and exam are consistent with exacerbation of irritable bowel syndrome. Recommend low-FODMAP diet. Continue with probiotic. Does not appear to be infectious and most recent colonoscopy within the last 3 years was normal. Question possible irritable bowel disease. If symptoms worsen consider stool culture and or referral to GI.

## 2016-01-11 NOTE — Progress Notes (Signed)
Subjective:    Patient ID: Curtis [name], male    DOB: 08-Sep-1975, 41 y.o.   MRN: [id number]  Chief Complaint  Patient presents with  . Stool Color Change    HPI:  Curtis [name] is a 41 y.o. male who  has a past medical history of ACNE, CYSTIC; Arthritis; IBS (irritable bowel syndrome); INGUINAL HERNIA, RIGHT (04/24/2008); and Lactose intolerance. and presents today for an acute office visit.  Associated symptoms of change in stool have been going on for about 2-3 weeks waxing and waning with about 2-3 occurences with normal bowel movements in between. Has had some abdominal pain/cramping located in his lower abdomen. Frequency bowel movements is normally 2-3 per day with in the last 24 hours having gone about 4-5 times. No constipation. During most recent he noted some mild blood located in the mucus as well. Nutritional intake consists of a high fiber diet especially with fruits and vegetables. Modifying factors include a probiotic and high fiber diet.  No Known Allergies    Outpatient Medications Prior to Visit  Medication Sig Dispense Refill  . methocarbamol (ROBAXIN) 500 MG tablet Take 1 tablet (500 mg total) by mouth 3 (three) times daily as needed for muscle spasms. 60 tablet 0   No facility-administered medications prior to visit.      Past Medical History:  Diagnosis Date  . ACNE, CYSTIC    resolved  . Arthritis    spine, early onset  . IBS (irritable bowel syndrome)   . INGUINAL HERNIA, RIGHT 04/24/2008   s/p RIH repair  . Lactose intolerance    diet controlled    Review of Systems  Constitutional: Negative for chills and fever.  Respiratory: Negative for chest tightness, shortness of breath and wheezing.   Cardiovascular: Negative for chest pain, palpitations and leg swelling.  Gastrointestinal: Positive for abdominal pain and diarrhea. Negative for abdominal distention, constipation, nausea, rectal pain and vomiting.      Objective:    BP  134/90 (BP Location: Left Arm, Patient Position: Sitting, Cuff Size: Normal)   Pulse 66   Temp 97.8 F (36.6 C) (Oral)   Resp 16   Ht 6' 1"  (1.854 m)   Wt 170 lb (77.1 kg)   SpO2 96%   BMI 22.43 kg/m  Nursing note and vital signs reviewed.  Physical Exam  Constitutional: He is oriented to person, place, and time. He appears well-developed and well-nourished. No distress.  Cardiovascular: Normal rate, regular rhythm, normal heart sounds and intact distal pulses.   Pulmonary/Chest: Effort normal and breath sounds normal.  Abdominal: Normal appearance and bowel sounds are normal. He exhibits no mass. There is no hepatosplenomegaly. There is tenderness in the suprapubic area and left lower quadrant. There is no rigidity, no rebound, no guarding, no tenderness at McBurney's point and negative Murphy's sign.  Neurological: He is alert and oriented to person, place, and time.  Skin: Skin is warm and dry.  Psychiatric: He has a normal mood and affect. His behavior is normal. Judgment and thought content normal.       Assessment & Plan:   Problem List Items Addressed This Visit      Digestive   IBS (irritable bowel syndrome) - Primary    Symptoms and exam are consistent with exacerbation of irritable bowel syndrome. Recommend low-FODMAP diet. Continue with probiotic. Does not appear to be infectious and most recent colonoscopy within the last 3 years was normal. Question possible irritable bowel disease. If  symptoms worsen consider stool culture and or referral to GI.           I have discontinued Curtis [name]'s methocarbamol.   Follow-up: Return in about 1 month (around 02/11/2016), or if symptoms worsen or fail to improve.  Mauricio Po, FNP

## 2016-03-15 ENCOUNTER — Telehealth: Payer: Self-pay | Admitting: Family

## 2016-03-15 MED ORDER — CLOTRIMAZOLE-BETAMETHASONE 1-0.05 % EX CREA: 1.0000 "application " | TOPICAL_CREAM | Freq: Two times a day (BID) | CUTANEOUS | 0 refills | Status: DC

## 2016-03-15 NOTE — Telephone Encounter (Signed)
Patient requesting refill on clotrimazole betamethasome dipropiomate 1%.  To be sent to [facility] on [address]

## 2016-03-15 NOTE — Telephone Encounter (Signed)
Rx has been sent  

## 2016-04-25 ENCOUNTER — Other Ambulatory Visit (INDEPENDENT_AMBULATORY_CARE_PROVIDER_SITE_OTHER): Payer: BLUE CROSS/BLUE SHIELD

## 2016-04-25 ENCOUNTER — Encounter (INDEPENDENT_AMBULATORY_CARE_PROVIDER_SITE_OTHER): Payer: Self-pay

## 2016-04-25 ENCOUNTER — Ambulatory Visit (INDEPENDENT_AMBULATORY_CARE_PROVIDER_SITE_OTHER): Payer: BLUE CROSS/BLUE SHIELD | Admitting: Physician Assistant

## 2016-04-25 ENCOUNTER — Encounter: Payer: Self-pay | Admitting: Physician Assistant

## 2016-04-25 VITALS — BP 110/70 | HR 68 | Ht 73.0 in | Wt 169.0 lb

## 2016-04-25 DIAGNOSIS — R109 Unspecified abdominal pain: Secondary | ICD-10-CM

## 2016-04-25 DIAGNOSIS — R194 Change in bowel habit: Secondary | ICD-10-CM

## 2016-04-25 DIAGNOSIS — R195 Other fecal abnormalities: Secondary | ICD-10-CM | POA: Diagnosis not present

## 2016-04-25 DIAGNOSIS — K589 Irritable bowel syndrome without diarrhea: Secondary | ICD-10-CM

## 2016-04-25 DIAGNOSIS — K625 Hemorrhage of anus and rectum: Secondary | ICD-10-CM

## 2016-04-25 LAB — CBC WITH DIFFERENTIAL/PLATELET
Basophils Absolute: 0 10*3/uL (ref 0.0–0.1)
Basophils Relative: 0.3 % (ref 0.0–3.0)
EOS PCT: 1.3 % (ref 0.0–5.0)
Eosinophils Absolute: 0.1 10*3/uL (ref 0.0–0.7)
HCT: 44.6 % (ref 39.0–52.0)
Hemoglobin: 15.3 g/dL (ref 13.0–17.0)
LYMPHS ABS: 1.5 10*3/uL (ref 0.7–4.0)
Lymphocytes Relative: 23.2 % (ref 12.0–46.0)
MCHC: 34.3 g/dL (ref 30.0–36.0)
MCV: 88.9 fl (ref 78.0–100.0)
MONOS PCT: 8.2 % (ref 3.0–12.0)
Monocytes Absolute: 0.5 10*3/uL (ref 0.1–1.0)
NEUTROS ABS: 4.3 10*3/uL (ref 1.4–7.7)
NEUTROS PCT: 67 % (ref 43.0–77.0)
Platelets: 252 10*3/uL (ref 150.0–400.0)
RBC: 5.01 Mil/uL (ref 4.22–5.81)
RDW: 13.2 % (ref 11.5–15.5)
WBC: 6.5 10*3/uL (ref 4.0–10.5)

## 2016-04-25 LAB — C-REACTIVE PROTEIN: CRP: 0.1 mg/dL — ABNORMAL LOW (ref 0.5–20.0)

## 2016-04-25 LAB — SEDIMENTATION RATE: Sed Rate: 6 mm/hr (ref 0–15)

## 2016-04-25 MED ORDER — NA SULFATE-K SULFATE-MG SULF 17.5-3.13-1.6 GM/177ML PO SOLN: 1.0000 | Freq: Once | ORAL | 0 refills | Status: AC

## 2016-04-25 NOTE — Progress Notes (Signed)
Subjective:    Patient ID: Curtis [name], male    DOB: 1975-07-27, 41 y.o.   MRN: [id number]  HPI Curtis [name] is a pleasant 41 year old white male,Known to Dr. Fuller Plan from prior colonoscopy . Pt  was last seen here in 2015 and underwent colonoscopy in April 2015 which was a normal exam. He was given a prescription for Robinul twice daily as needed. He comes in today stating that he has had IBS-type symptoms "forever" which have manifested with urgency for bowel movements and intermittent abdominal cramping and bloating. He says since January 2018 he has been having problems with passage of mucus and blood. He says he is having frequent episodes which have increased in frequency over the past couple of months of early morning cramping and urgency for bowel movements which wakes him from sleep. He says he feels as if he just needs to pass gas and then has passed a lot of mucus. He is also had any increase in cramping and lower abdominal discomfort. He says he eats a lot of fiber and it is not unusual for him to have 2-3 bowel movements per day.  Over the past 4-6 weeks she is now seeing streaks of blood mixed in with mucus and stools almost every morning. He does have some rectal pressure or tenesmus type symptoms ,off and on. Appetite has been okay weight has been stable. He has been taking a probiotic for several years. No other changes in medications etc. Family history is negative for IBD and colon cancer.  Review of Systems Pertinent positive and negative review of systems were noted in the above HPI section.  All other review of systems was otherwise negative.  Outpatient Encounter Prescriptions as of 04/25/2016  Medication Sig  . clotrimazole-betamethasone (LOTRISONE) cream Apply 1 application topically 2 (two) times daily.  . Na Sulfate-K Sulfate-Mg Sulf 17.5-3.13-1.6 GM/180ML SOLN Take 1 kit by mouth once.   No facility-administered encounter medications on file as of 04/25/2016.    No  Known Allergies Patient Active Problem List   Diagnosis Date Noted  . Routine general medical examination at a health care facility 08/23/2015  . Hypersomnolence 12/09/2014  . Fatigue 11/12/2014  . SOB (shortness of breath) 11/12/2014  . Back muscle spasm 10/29/2014  . Skin change 06/13/2013  . Change in bowel habits 03/10/2013  . IBS (irritable bowel syndrome) 03/10/2013  . Pleurisy 02/20/2009  . INGUINAL HERNIA, RIGHT 04/24/2008  . DIARRHEA 04/24/2008   Social History   Social History  . Marital status: Single    Spouse name: N/A  . Number of children: 0  . Years of education: 15   Occupational History  . copy room [employer]   Social History Main Topics  . Smoking status: Never Smoker  . Smokeless tobacco: Never Used  . Alcohol use 1.8 oz/week    3 Cans of beer per week     Comment: Occasinal  . Drug use: Yes    Types: Marijuana     Comment: MJ-occassional  . Sexual activity: Not on file   Other Topics Concern  . Not on file   Social History Narrative   Fun: Play basketball,golf, binge watching     Curtis [name]'s family history includes Colon polyps in his father; Stroke (age of onset: 85) in his maternal grandmother; Urolithiasis in his father.      Objective:    Vitals:   04/25/16 1017  BP: 110/70  Pulse: 68    Physical Exam  well-developed  white male in no acute distress, pleasant blood pressure 110/70 pulse 68, height 6 foot 1, weight 169, BMI 22.3. HEENT ;nontraumatic normocephalic EOMI PERRLA sclera anicteric, Cardiovascular; regular rate and rhythm with S1-S2 no murmur or gallop, Pulmonary ;clear bilaterally, Abdomen; often, no focal tenderness no guarding or rebound no palpable mass or hepatosplenomegaly bowel sounds are present, Rectal; exam not done, Extremities ;no clubbing cyanosis or edema skin warm and dry, Neuropsych; mood and affect appropriate       Assessment & Plan:   #5 41 year old white male with history of IBS now with 4 month history  of increasing lower abdominal discomfort, cramping and passage of large amounts of mucus and intermittent blood with mucus, both with and independent of bowel movements.  No internal hemorrhoids noted on Colonoscopy 2015. Feel it is important to rule out proctitis or new onset colitis, versus occult lesion  Plan; patient will be scheduled for colonoscopy with Dr. Fuller Plan. Procedure discussed in detail with the patient including  risks and benefits and he is agreeable to proceed. Check CBC, sedimentation rate and CRP. Further recommendations pending results of above.    Amy S Esterwood PA-C 04/25/2016   Cc: [place], FNP

## 2016-04-25 NOTE — Progress Notes (Signed)
Reviewed and agree with management plan.  Fuller Makin T. Ross Bender, MD FACG 

## 2016-04-25 NOTE — Patient Instructions (Signed)
Your physician has requested that you go to the basement for lab work before leaving today.  You have been scheduled for a colonoscopy. Please follow written instructions given to you at your visit today.  Please pick up your prep supplies at the pharmacy within the next 1-3 days. If you use inhalers (even only as needed), please bring them with you on the day of your procedure. Your physician has requested that you go to www.startemmi.com and enter the access code given to you at your visit today. This web site gives a general overview about your procedure. However, you should still follow specific instructions given to you by our office regarding your preparation for the procedure.

## 2016-04-26 ENCOUNTER — Encounter: Payer: Self-pay | Admitting: Gastroenterology

## 2016-05-05 ENCOUNTER — Encounter: Payer: BLUE CROSS/BLUE SHIELD | Admitting: Gastroenterology

## 2016-05-12 ENCOUNTER — Encounter: Payer: Self-pay | Admitting: Gastroenterology

## 2016-05-12 ENCOUNTER — Ambulatory Visit (AMBULATORY_SURGERY_CENTER): Payer: BLUE CROSS/BLUE SHIELD | Admitting: Gastroenterology

## 2016-05-12 ENCOUNTER — Telehealth: Payer: Self-pay | Admitting: Gastroenterology

## 2016-05-12 VITALS — BP 119/79 | HR 66 | Temp 98.7°F | Resp 15 | Ht 73.0 in | Wt 169.0 lb

## 2016-05-12 DIAGNOSIS — K529 Noninfective gastroenteritis and colitis, unspecified: Secondary | ICD-10-CM | POA: Diagnosis not present

## 2016-05-12 DIAGNOSIS — K921 Melena: Secondary | ICD-10-CM

## 2016-05-12 DIAGNOSIS — K6389 Other specified diseases of intestine: Secondary | ICD-10-CM

## 2016-05-12 DIAGNOSIS — R152 Fecal urgency: Secondary | ICD-10-CM | POA: Diagnosis not present

## 2016-05-12 MED ORDER — MESALAMINE 1.2 G PO TBEC: 1.2000 g | DELAYED_RELEASE_TABLET | Freq: Every day | ORAL | 11 refills | Status: DC

## 2016-05-12 MED ORDER — SODIUM CHLORIDE 0.9 % IV SOLN: 500.0000 mL | INTRAVENOUS | Status: DC

## 2016-05-12 MED ORDER — MESALAMINE 1000 MG RE SUPP: 1000.0000 mg | Freq: Every day | RECTAL | 0 refills | Status: DC

## 2016-05-12 NOTE — Telephone Encounter (Signed)
Patient he went to the pharmacy and picked up the Lialda but when he got home the Wolverine was not in the bag. He called the pharmacy and they told him the Canasa suppositories are on hold and we needed to call to get them off hold. Told patient I will call the pharmacy and find out what happened. Spoke with pharmacy and pharmacist states the rx for Curtis [name] is not hold but ready to picked up for a $40 co-pay. She states initially they did not have his insurance and maybe somehow the rx got put on temporary hold. She was not sure but it is ready for pick up. Called and left message for patient to return my call.

## 2016-05-12 NOTE — Op Note (Signed)
[facility] Patient Name: Akhil Piscopo Procedure Date: 05/12/2016 1:31 PM MRN: [id number] Endoscopist: Ladene Artist , MD Age: 41 Referring MD:  Date of Birth: Sep 23, 1975 Gender: Male Account #: [id number] Procedure:                Colonoscopy Indications:              Hematochezia, Change in bowel habits Medicines:                Monitored Anesthesia Care Procedure:                Pre-Anesthesia Assessment:                           - Prior to the procedure, a History and Physical                            was performed, and patient medications and                            allergies were reviewed. The patient's tolerance of                            previous anesthesia was also reviewed. The risks                            and benefits of the procedure and the sedation                            options and risks were discussed with the patient.                            All questions were answered, and informed consent                            was obtained. Prior Anticoagulants: The patient has                            taken no previous anticoagulant or antiplatelet                            agents. ASA Grade Assessment: II - A patient with                            mild systemic disease. After reviewing the risks                            and benefits, the patient was deemed in                            satisfactory condition to undergo the procedure.                           After obtaining informed consent, the colonoscope  was passed under direct vision. Throughout the                            procedure, the patient's blood pressure, pulse, and                            oxygen saturations were monitored continuously. The                            Model PCF-H190DL [id number]) scope was introduced                            through the anus and advanced to the the cecum,                            identified by  appendiceal orifice and ileocecal                            valve. The ileocecal valve, appendiceal orifice,                            and rectum were photographed. The quality of the                            bowel preparation was excellent. The colonoscopy                            was performed without difficulty. The patient                            tolerated the procedure well. Scope In: 1:46:48 PM Scope Out: 2:00:57 PM Scope Withdrawal Time: 0 hours 11 minutes 55 seconds  Total Procedure Duration: 0 hours 14 minutes 9 seconds  Findings:                 The perianal and digital rectal examinations were                            normal.                           Diffuse moderate inflammation characterized by                            erythema, friability, granularity, loss of                            vascularity and mucus was found in the rectum and                            in the sigmoid colon. Biopsies were taken with a                            cold forceps for histology.  The exam was otherwise without abnormality on                            direct and retroflexion views. Complications:            No immediate complications. Estimated blood loss:                            None. Estimated Blood Loss:     Estimated blood loss: none. Impression:               - Diffuse moderate inflammation was found in the                            rectum and in the sigmoid colon secondary to                            proctosigmoid colitis. Biopsied.                           - The examination was otherwise normal on direct                            and retroflexion views. Recommendation:           - Repeat colonoscopy date to be determined after                            pending pathology results are reviewed.                           - Patient has a contact number available for                            emergencies. The signs and symptoms of  potential                            delayed complications were discussed with the                            patient. Return to normal activities tomorrow.                            Written discharge instructions were provided to the                            patient.                           - Resume previous diet.                           - Continue present medications.                           - Await pathology results.                           -  Lialda 1.2 gm at 2 tabs PO BID daily indefinitely.                           - Canasa 1000 mg suppository 1 per rectum QHS for 1                            month.                           - Return to GI office in 4-6 weeks. Ladene Artist, MD 05/12/2016 2:06:56 PM This report has been signed electronically.

## 2016-05-12 NOTE — Progress Notes (Signed)
Called to room to assist during endoscopic procedure.  Patient ID and intended procedure confirmed with present staff. Received instructions for my participation in the procedure from the performing physician.  

## 2016-05-12 NOTE — Progress Notes (Signed)
Report to PACU, RN, vss, BBS= Clear.  

## 2016-05-12 NOTE — Patient Instructions (Signed)
YOU HAD AN ENDOSCOPIC PROCEDURE TODAY AT [facility]:   Refer to the procedure report that was given to you for any specific questions about what was found during the examination.  If the procedure report does not answer your questions, please call your gastroenterologist to clarify.  If you requested that your care partner not be given the details of your procedure findings, then the procedure report has been included in a sealed envelope for you to review at your convenience later.  YOU SHOULD EXPECT: Some feelings of bloating in the abdomen. Passage of more gas than usual.  Walking can help get rid of the air that was put into your GI tract during the procedure and reduce the bloating. If you had a lower endoscopy (such as a colonoscopy or flexible sigmoidoscopy) you may notice spotting of blood in your stool or on the toilet paper. If you underwent a bowel prep for your procedure, you may not have a normal bowel movement for a few days.  Please Note:  You might notice some irritation and congestion in your nose or some drainage.  This is from the oxygen used during your procedure.  There is no need for concern and it should clear up in a day or so.  SYMPTOMS TO REPORT IMMEDIATELY:   Following lower endoscopy (colonoscopy or flexible sigmoidoscopy):  Excessive amounts of blood in the stool  Significant tenderness or worsening of abdominal pains  Swelling of the abdomen that is new, acute  Fever of 100F or higher   For urgent or emergent issues, a gastroenterologist can be reached at any hour by calling [phone].   DIET:  We do recommend a small meal at first, but then you may proceed to your regular diet.  Drink plenty of fluids but you should avoid alcoholic beverages for 24 hours.  ACTIVITY:  You should plan to take it easy for the rest of today and you should NOT DRIVE or use heavy machinery until tomorrow (because of the sedation medicines used during the test).     FOLLOW UP: Our staff will call the number listed on your records the next business day following your procedure to check on you and address any questions or concerns that you may have regarding the information given to you following your procedure. If we do not reach you, we will leave a message.  However, if you are feeling well and you are not experiencing any problems, there is no need to return our call.  We will assume that you have returned to your regular daily activities without incident.  If any biopsies were taken you will be contacted by phone or by letter within the next 1-3 weeks.  Please call us at [phone] if you have not heard about the biopsies in 3 weeks.    SIGNATURES/CONFIDENTIALITY: You and/or your care partner have signed paperwork which will be entered into your electronic medical record.  These signatures attest to the fact that that the information above on your After Visit Summary has been reviewed and is understood.  Full responsibility of the confidentiality of this discharge information lies with you and/or your care-partner.  Lialda 2 tabs twice daily indefinitely.  Canasa suppository 1 per rectum every bedtime for one month.  See Dr. Fuller Plan in office 4-6 weeks.

## 2016-05-15 ENCOUNTER — Telehealth: Payer: Self-pay

## 2016-05-15 NOTE — Telephone Encounter (Signed)
Informed patient that his rx for Curtis [name] is ready at the pharmacy to be picked up. Patient verbalized understanding and asked if he was supposed to have a f/u visit scheduled. Informed patient that according to his procedure report he is supposed to have a 4-6 week f/u visit. Appt scheduled for 06/28/16 3:45pm.

## 2016-05-15 NOTE — Telephone Encounter (Signed)
  Follow up Call-  Call back number 05/12/2016  Post procedure Call Back phone  # [phone] cell  Permission to leave phone message Yes  Some recent data might be hidden     Patient questions:  Do you have a fever, pain , or abdominal swelling? No. Pain Score  0 *  Have you tolerated food without any problems? Yes.    Have you been able to return to your normal activities? Yes.    Do you have any questions about your discharge instructions: Diet   No. Medications  No. Follow up visit  No.  Do you have questions or concerns about your Care? No.  Actions: * If pain score is 4 or above: No action needed, pain <4.

## 2016-05-24 ENCOUNTER — Encounter: Payer: Self-pay | Admitting: Gastroenterology

## 2016-06-28 ENCOUNTER — Encounter: Payer: Self-pay | Admitting: Gastroenterology

## 2016-06-28 ENCOUNTER — Ambulatory Visit (INDEPENDENT_AMBULATORY_CARE_PROVIDER_SITE_OTHER): Payer: BLUE CROSS/BLUE SHIELD | Admitting: Gastroenterology

## 2016-06-28 ENCOUNTER — Encounter (INDEPENDENT_AMBULATORY_CARE_PROVIDER_SITE_OTHER): Payer: Self-pay

## 2016-06-28 VITALS — BP 108/62 | HR 60 | Ht 72.34 in | Wt 169.4 lb

## 2016-06-28 DIAGNOSIS — K580 Irritable bowel syndrome with diarrhea: Secondary | ICD-10-CM | POA: Diagnosis not present

## 2016-06-28 DIAGNOSIS — K51311 Ulcerative (chronic) rectosigmoiditis with rectal bleeding: Secondary | ICD-10-CM

## 2016-06-28 MED ORDER — MESALAMINE 1.2 G PO TBEC: 1.2000 g | DELAYED_RELEASE_TABLET | Freq: Two times a day (BID) | ORAL | 5 refills | Status: DC

## 2016-06-28 NOTE — Patient Instructions (Addendum)
We have sent the following medications to your pharmacy for you to pick up at your convenience:  Lialda, 1.2g, Take twice a day   Thank you for choosing me and [facility].  Pricilla Riffle. Dagoberto Ligas., MD., Marval Regal

## 2016-06-28 NOTE — Progress Notes (Signed)
    History of Present Illness: This is a 41 year old male with recently diagnosed proctosigmoiditis returning for follow-up. He misunderstood directions and has taken Lialda 1.2 g twice a day instead of 2.4 g twice a day. He completed a course of Canasa suppositories. He has had complete resolution of his bloody diarrhea. He has had occasions of activity of irritable bowel syndrome related to certain dietary stressors otherwise he is felt well.  Colonoscopy 05/2016 - Diffuse moderate inflammation was found in the rectum and in the sigmoid colon secondary to proctosigmoid colitis. Biopsied. - The examination was otherwise normal on direct and retroflexion views.  1. Surgical [P], random right colon sites - BENIGN COLONIC MUCOSA WITH LYMPHOID AGGREGATES. - NO ACTIVE INFLAMMATION OR EVIDENCE OF MICROSCOPIC COLITIS. - NO DYSPLASIA OR MALIGNANCY.  2. Surgical [P], random left colon sites - BENIGN COLONIC MUCOSA WITH LYMPHOID AGGREGATES. - NO ACTIVE INFLAMMATION OR EVIDENCE OF MICROSCOPIC COLITIS. - NO DYSPLASIA OR MALIGNANCY.  3. Surgical [P], proctosigmoid - CHRONIC ACTIVE COLITIS, SEE COMMENT. - NO DYSPLASIA OR MALIGNANCY.  Microscopic Comment 3. There is patchy mild activity. While the crypts are largely orderly, there is focal basal lymphoplasmacytic infiltrates and thus inflammatory bowel disease is in the differential. Granulomas are not identified. Vicente Males MD men Gross and Clinical Information   Current Medications, Allergies, Past Medical History, Past Surgical History, Family History and Social History were reviewed in [employer] record.  Physical Exam: General: Well developed, well nourished, no acute distress Head: Normocephalic and atraumatic Eyes:  sclerae anicteric, EOMI Ears: Normal auditory acuity Mouth: No deformity or lesions Lungs: Clear throughout to auscultation Heart: Regular rate and rhythm; no murmurs, rubs or bruits Abdomen:  Soft, non tender and non distended. No masses, hepatosplenomegaly or hernias noted. Normal Bowel sounds Musculoskeletal: Symmetrical with no gross deformities  Pulses:  Normal pulses noted Extremities: No clubbing, cyanosis, edema or deformities noted Neurological: Alert oriented x 4, grossly nonfocal Psychological:  Alert and cooperative. Normal mood and affect  Assessment and Recommendations:  1. Ulcerative proctosigmoiditis. We discussed his colonoscopy findings, pathology results, the disease, its management and follow-up. He had a number of questions that I addressed to his satisfaction. Since he improved on a lower dose of Lialda we will continue Lialda 1.2 g twice a day. Remain off supposities for now. Return office visit 6 months.  2. IBS. Avoid dietary stressors. Symptoms are not active enough to warrant medications at this time.  I spent 15 minutes of face-to-face time with the patient. Greater than 50% of the time was spent counseling and coordinating care.

## 2016-07-25 ENCOUNTER — Telehealth: Payer: Self-pay | Admitting: Gastroenterology

## 2016-07-25 MED ORDER — MESALAMINE 1.2 G PO TBEC: 2.4000 g | DELAYED_RELEASE_TABLET | Freq: Two times a day (BID) | ORAL | 4 refills | Status: DC

## 2016-07-25 NOTE — Telephone Encounter (Signed)
Patient states he was Curtis [name] put on Lialda 2.4 grams twice daily after his colonoscopy. He read the script wrong and was taking his Lialda 1.2 grams twice daily up until his office visit on 06/14/16. Patient states he noticed for the last week that he bowels were sluggish and almost constipated and decided he needed to go back to his original dosage for Lialda. Patient wants to let us know he is taking Lialda 2.4 grams twice a day and he needs a new rx for this dosage. Also he states his bowels are back to normal and he has seen improvement on this dosage. Can I gi ahead and send a refill with new dosage Dr. Fuller Plan?

## 2016-07-25 NOTE — Telephone Encounter (Signed)
OK to remain on Lialda 2.4 g bid until next office visit.

## 2016-10-09 ENCOUNTER — Encounter: Payer: Self-pay | Admitting: Family Medicine

## 2016-10-09 ENCOUNTER — Ambulatory Visit (INDEPENDENT_AMBULATORY_CARE_PROVIDER_SITE_OTHER): Payer: BLUE CROSS/BLUE SHIELD | Admitting: Family Medicine

## 2016-10-09 VITALS — BP 124/80 | HR 72 | Temp 97.5°F | Ht 72.03 in | Wt 177.0 lb

## 2016-10-09 DIAGNOSIS — M79674 Pain in right toe(s): Secondary | ICD-10-CM

## 2016-10-09 NOTE — Progress Notes (Signed)
Curtis [name] - 41 y.o. male MRN [id number]  Date of birth: [birth year]  SUBJECTIVE:  Including CC & ROS.  Chief Complaint  Patient presents with  . Toe Injury    Patient is here today C/O right pinky toe pain after kicking it on the door frame on 10.07.2018.  Difficulty walking.    Curtis [name] is a 41 year old male that is presenting with right pinky toe pain. He was walking into the bathroom last night and hit his toe on the wall. He denies any initial pain but has gotten worse for the course of the day. He did endorses some swelling and bruising. Has not taken any medications today. Denies any numbness or tingling. Is walking with a limp. The pain is localized to his toe.     Review of Systems  Musculoskeletal: Positive for gait problem and joint swelling.  Skin: Positive for color change.  Neurological: Negative for weakness and numbness.  Hematological: Negative for adenopathy.    HISTORY: Past Medical, Surgical, Social, and Family History Reviewed & Updated per EMR.   Pertinent Historical Findings include:  Past Medical History:  Diagnosis Date  . ACNE, CYSTIC    resolved  . Arthritis    spine, early onset  . IBS (irritable bowel syndrome)   . INGUINAL HERNIA, RIGHT 04/24/2008   s/p RIH repair  . Lactose intolerance    diet controlled    Past Surgical History:  Procedure Laterality Date  . COLONOSCOPY    . HERNIA REPAIR Right 2010   Dr. Grandville Silos  . Surgery for spont pneumothorax Right 1996  . WISDOM TOOTH EXTRACTION      No Known Allergies  Family History  Problem Relation Age of Onset  . Urolithiasis Father        Had kidney removed at age 7  . Stroke Maternal Grandmother 53  . Colon cancer Neg Hx   . Esophageal cancer Neg Hx   . Pancreatic cancer Neg Hx   . Prostate cancer Neg Hx   . Rectal cancer Neg Hx   . Stomach cancer Neg Hx      Social History   Social History  . Marital status: Single    Spouse name: N/A  . Number of children: 0    . Years of education: 15   Occupational History  . copy room [employer]   Social History Main Topics  . Smoking status: Never Smoker  . Smokeless tobacco: Never Used  . Alcohol use 1.8 oz/week    3 Cans of beer per week     Comment: Occasinal  . Drug use: Yes    Types: Marijuana     Comment: 05-11-16  . Sexual activity: Not on file   Other Topics Concern  . Not on file   Social History Narrative   Fun: Play basketball,golf, binge watching      PHYSICAL EXAM:  VS: BP 124/80 (BP Location: Left Arm, Patient Position: Sitting, Cuff Size: Normal)   Pulse 72   Temp (!) 97.5 F (36.4 C) (Oral)   Ht 6' 0.03" (1.83 m)   Wt 177 lb (80.3 kg)   SpO2 97%   BMI 23.98 kg/m  Physical Exam Gen: NAD, alert, cooperative with exam, well-appearing ENT: normal lips, normal nasal mucosa,  Eye: normal EOM, normal conjunctiva and lids CV:  no edema, +2 pedal pulses   Resp: no accessory muscle use, non-labored,  GI: no masses or tenderness, no hernia  Skin: no rashes, no areas of  induration  Neuro: normal tone, normal sensation to touch Psych:  normal insight, alert and oriented MSK:  Right foot: Swelling and ecchymosis of the pinky toe. Tenderness to palpation over the interphalangeal joint. Normal flexion and extension. Ambulating with a limp. Neurovascularly intact   Limited ultrasound: Right fifth digit:  Appears to be some swelling of the proximal phalangeal joint. There appears to be a discontinuity of the middle phalanx The fifth MTP joint is normal  Summary: Proximal phalangeal joint sprain with possible middle phalanx fracture  Ultrasound and interpretation by Clearance Coots, MD        ASSESSMENT & PLAN:   Toe pain, right It appears that he may have a fracture of the middle phalanx or just a sprain of the PIP joint - Advised ice and elevation - Can use Tylenol and iron profile for pain control - If no improvement can x-ray and try postop shoe

## 2016-10-09 NOTE — Patient Instructions (Signed)
Thank you for coming in,   Please use ice, compression and elevation for your toe.   Wearing a stiffer soled shoe can help.   Please try ibuprofen or tylenol for the pain.   Please follow up in two weeks if no improvement.    Please feel free to call with any questions or concerns at any time, at [phone]. --Dr. Raeford Razor

## 2016-10-09 NOTE — Assessment & Plan Note (Signed)
It appears that he may have a fracture of the middle phalanx or just a sprain of the PIP joint - Advised ice and elevation - Can use Tylenol and iron profile for pain control - If no improvement can x-ray and try postop shoe

## 2017-02-22 ENCOUNTER — Ambulatory Visit (INDEPENDENT_AMBULATORY_CARE_PROVIDER_SITE_OTHER): Payer: BLUE CROSS/BLUE SHIELD | Admitting: Family Medicine

## 2017-02-22 ENCOUNTER — Encounter: Payer: Self-pay | Admitting: Family Medicine

## 2017-02-22 VITALS — BP 118/62 | HR 72 | Temp 97.9°F | Ht 75.0 in | Wt 172.0 lb

## 2017-02-22 DIAGNOSIS — R05 Cough: Secondary | ICD-10-CM | POA: Diagnosis not present

## 2017-02-22 DIAGNOSIS — R059 Cough, unspecified: Secondary | ICD-10-CM

## 2017-02-22 NOTE — Patient Instructions (Signed)
Please try things such as zyrtec-D or allegra-D which is an antihistamine and decongestant.   Please try afrin which will help with nasal congestion but use for only three days.   Please also try using a netti pot on a regular occasion.  Honey can help with a sore throat and cough.   Vick's and delsym can help with cough  Using a humidifier can help at night with a cough.

## 2017-02-22 NOTE — Progress Notes (Signed)
Curtis [name] - 42 y.o. male MRN [id number]  Date of birth: 1975-09-09  SUBJECTIVE:  Including CC & ROS.  Chief Complaint  Patient presents with  . Cough    Curtis [name] is a 42 y.o. male that is presenting with a cough. Ongoing for five days. Admits to chills and body aches. He has been coughing up mucous. Admits to shortness of breath. He has been taking Nyquil with no improvement. He has not been around anyone with similar symptoms. Worse at night.  Review of the chest x-ray from 2016 shows stable apical pleural scarring bilaterally   Review of Systems  Constitutional: Negative for fever.  HENT: Positive for rhinorrhea and sore throat.   Respiratory: Positive for cough.   Cardiovascular: Negative for chest pain.  Gastrointestinal: Negative for abdominal pain.  Musculoskeletal: Negative for back pain.  Neurological: Negative for weakness.  Hematological: Negative for adenopathy.  Psychiatric/Behavioral: Negative for agitation.    HISTORY: Past Medical, Surgical, Social, and Family History Reviewed & Updated per EMR.   Pertinent Historical Findings include:  Past Medical History:  Diagnosis Date  . ACNE, CYSTIC    resolved  . Arthritis    spine, early onset  . IBS (irritable bowel syndrome)   . INGUINAL HERNIA, RIGHT 04/24/2008   s/p RIH repair  . Lactose intolerance    diet controlled    Past Surgical History:  Procedure Laterality Date  . COLONOSCOPY    . HERNIA REPAIR Right 2010   Dr. Grandville Silos  . Surgery for spont pneumothorax Right 1996  . WISDOM TOOTH EXTRACTION      No Known Allergies  Family History  Problem Relation Age of Onset  . Urolithiasis Father        Had kidney removed at age 76  . Stroke Maternal Grandmother 28  . Colon cancer Neg Hx   . Esophageal cancer Neg Hx   . Pancreatic cancer Neg Hx   . Prostate cancer Neg Hx   . Rectal cancer Neg Hx   . Stomach cancer Neg Hx      Social History   Socioeconomic History  .  Marital status: Single    Spouse name: Not on file  . Number of children: 0  . Years of education: 49  . Highest education level: Not on file  Social Needs  . Financial resource strain: Not on file  . Food insecurity - worry: Not on file  . Food insecurity - inability: Not on file  . Transportation needs - medical: Not on file  . Transportation needs - non-medical: Not on file  Occupational History  . Occupation: copy room    Employer: [employer]  Tobacco Use  . Smoking status: Never Smoker  . Smokeless tobacco: Never Used  Substance and Sexual Activity  . Alcohol use: Yes    Alcohol/week: 1.8 oz    Types: 3 Cans of beer per week    Comment: Occasinal  . Drug use: Yes    Types: Marijuana    Comment: 05-11-16  . Sexual activity: Not on file  Other Topics Concern  . Not on file  Social History Narrative   Fun: Play basketball,golf, binge watching      PHYSICAL EXAM:  VS: BP 118/62 (BP Location: Left Arm, Patient Position: Sitting, Cuff Size: Normal)   Pulse 72   Temp 97.9 F (36.6 C) (Oral)   Ht 6' 3"  (1.905 m)   Wt 172 lb (78 kg)   SpO2 98%  BMI 21.50 kg/m  Physical Exam Gen: NAD, alert, cooperative with exam,  ENT: normal lips, normal nasal mucosa, tympanic membranes clear and intact bilaterally, normal oropharynx, no cervical lymphadenopathy Eye: normal EOM, normal conjunctiva and lids CV:  no edema, +2 pedal pulses, regular rate and rhythm, S1-S2   Resp: no accessory muscle use, non-labored, clear to auscultation bilaterally, no crackles or wheezes Skin: no rashes, no areas of induration  Neuro: normal tone, normal sensation to touch Psych:  normal insight, alert and oriented MSK: Normal gait, normal strength       ASSESSMENT & PLAN:   Cough Likely viral in nature  - counseled on supportive care.  - Given indications for follow-up.

## 2017-02-22 NOTE — Assessment & Plan Note (Signed)
Likely viral in nature  - counseled on supportive care.  - Given indications for follow-up.

## 2017-03-21 ENCOUNTER — Other Ambulatory Visit (INDEPENDENT_AMBULATORY_CARE_PROVIDER_SITE_OTHER): Payer: BLUE CROSS/BLUE SHIELD

## 2017-03-21 ENCOUNTER — Ambulatory Visit (INDEPENDENT_AMBULATORY_CARE_PROVIDER_SITE_OTHER): Payer: BLUE CROSS/BLUE SHIELD | Admitting: Internal Medicine

## 2017-03-21 ENCOUNTER — Encounter: Payer: Self-pay | Admitting: Internal Medicine

## 2017-03-21 VITALS — BP 122/84 | HR 70 | Temp 97.9°F | Ht 75.0 in | Wt 175.0 lb

## 2017-03-21 DIAGNOSIS — Z0000 Encounter for general adult medical examination without abnormal findings: Secondary | ICD-10-CM

## 2017-03-21 DIAGNOSIS — K529 Noninfective gastroenteritis and colitis, unspecified: Secondary | ICD-10-CM | POA: Insufficient documentation

## 2017-03-21 LAB — CBC WITH DIFFERENTIAL/PLATELET
BASOS PCT: 0.5 % (ref 0.0–3.0)
Basophils Absolute: 0 10*3/uL (ref 0.0–0.1)
EOS PCT: 2 % (ref 0.0–5.0)
Eosinophils Absolute: 0.2 10*3/uL (ref 0.0–0.7)
HEMATOCRIT: 44.1 % (ref 39.0–52.0)
HEMOGLOBIN: 15.2 g/dL (ref 13.0–17.0)
LYMPHS PCT: 24.1 % (ref 12.0–46.0)
Lymphs Abs: 1.8 10*3/uL (ref 0.7–4.0)
MCHC: 34.5 g/dL (ref 30.0–36.0)
MCV: 88.8 fl (ref 78.0–100.0)
MONOS PCT: 11.1 % (ref 3.0–12.0)
Monocytes Absolute: 0.8 10*3/uL (ref 0.1–1.0)
NEUTROS ABS: 4.6 10*3/uL (ref 1.4–7.7)
Neutrophils Relative %: 62.3 % (ref 43.0–77.0)
PLATELETS: 212 10*3/uL (ref 150.0–400.0)
RBC: 4.96 Mil/uL (ref 4.22–5.81)
RDW: 13.5 % (ref 11.5–15.5)
WBC: 7.4 10*3/uL (ref 4.0–10.5)

## 2017-03-21 LAB — URINALYSIS, ROUTINE W REFLEX MICROSCOPIC
Bilirubin Urine: NEGATIVE
Ketones, ur: NEGATIVE
Leukocytes, UA: NEGATIVE
Nitrite: NEGATIVE
PH: 6.5 (ref 5.0–8.0)
SPECIFIC GRAVITY, URINE: 1.02 (ref 1.000–1.030)
TOTAL PROTEIN, URINE-UPE24: NEGATIVE
Urine Glucose: NEGATIVE
Urobilinogen, UA: 0.2 (ref 0.0–1.0)

## 2017-03-21 LAB — TSH: TSH: 1.33 u[IU]/mL (ref 0.35–4.50)

## 2017-03-21 LAB — PSA: PSA: 1.64 ng/mL (ref 0.10–4.00)

## 2017-03-21 NOTE — Patient Instructions (Signed)

## 2017-03-21 NOTE — Progress Notes (Addendum)
Subjective:    Patient ID: Curtis [name], male    DOB: Nov 17, 1975, 42 y.o.   MRN: [id number]  HPI  Here for wellness and f/u;  Overall doing ok;  Pt denies Chest pain, worsening SOB, DOE, wheezing, orthopnea, PND, worsening LE edema, palpitations, dizziness or syncope.  Pt denies neurological change such as new headache, facial or extremity weakness.  Pt denies polydipsia, polyuria, or low sugar symptoms. Pt states overall good compliance with treatment and medications, good tolerability, and has been trying to follow appropriate diet.  Pt denies worsening depressive symptoms, suicidal ideation or panic. No fever, night sweats, wt loss, loss of appetite, or other constitutional symptoms.  Pt states good ability with ADL's, has low fall risk, home safety reviewed and adequate, no other significant changes in hearing or vision, and occasionally active with exercise Now on lialda for colitis (ulcerative per pt);  For f/u colonoscopy at 7 yrs.  No other interval hx or new complaints. Past Medical History:  Diagnosis Date  . ACNE, CYSTIC    resolved  . Arthritis    spine, early onset  . IBS (irritable bowel syndrome)   . INGUINAL HERNIA, RIGHT 04/24/2008   s/p RIH repair  . Lactose intolerance    diet controlled   Past Surgical History:  Procedure Laterality Date  . COLONOSCOPY    . HERNIA REPAIR Right 2010   Dr. Grandville Silos  . Surgery for spont pneumothorax Right 1996  . WISDOM TOOTH EXTRACTION      reports that he has never smoked. He has never used smokeless tobacco. He reports that he drinks about 1.8 oz of alcohol per week. He reports that he has current or past drug history. Drug: Marijuana. family history includes Stroke (age of onset: 65) in his maternal grandmother; Urolithiasis in his father. No Known Allergies Current Outpatient Medications on File Prior to Visit  Medication Sig Dispense Refill  . mesalamine (LIALDA) 1.2 g EC tablet Take 2 tablets (2.4 g total) by mouth 2  (two) times daily. 120 tablet 4   No current facility-administered medications on file prior to visit.    Review of Systems Constitutional: Negative for other unusual diaphoresis, sweats, appetite or weight changes HENT: Negative for other worsening hearing loss, ear pain, facial swelling, mouth sores or neck stiffness.   Eyes: Negative for other worsening pain, redness or other visual disturbance.  Respiratory: Negative for other stridor or swelling Cardiovascular: Negative for other palpitations or other chest pain  Gastrointestinal: Negative for worsening diarrhea or loose stools, blood in stool, distention or other pain Genitourinary: Negative for hematuria, flank pain or other change in urine volume.  Musculoskeletal: Negative for myalgias or other joint swelling.  Skin: Negative for other color change, or other wound or worsening drainage.  Neurological: Negative for other syncope or numbness. Hematological: Negative for other adenopathy or swelling Psychiatric/Behavioral: Negative for hallucinations, other worsening agitation, SI, self-injury, or new decreased concentration All other system neg per pt    Objective:   Physical Exam BP 122/84   Pulse 70   Temp 97.9 F (36.6 C) (Oral)   Ht 6' 3"  (1.905 m)   Wt 175 lb (79.4 kg)   SpO2 98%   BMI 21.87 kg/m  VS noted,  Constitutional: Pt is oriented to person, place, and time. Appears well-developed and well-nourished, in no significant distress and comfortable Head: Normocephalic and atraumatic  Eyes: Conjunctivae and EOM are normal. Pupils are equal, round, and reactive to light Right Ear:  External ear normal without discharge Left Ear: External ear normal without discharge Nose: Nose without discharge or deformity Mouth/Throat: Oropharynx is without other ulcerations and moist  Neck: Normal range of motion. Neck supple. No JVD present. No tracheal deviation present or significant neck LA or mass Cardiovascular: Normal rate,  regular rhythm, normal heart sounds and intact distal pulses.   Pulmonary/Chest: WOB normal and breath sounds without rales or wheezing  Abdominal: Soft. Bowel sounds are normal. NT. No HSM  Musculoskeletal: Normal range of motion. Exhibits no edema Lymphadenopathy: Has no other cervical adenopathy.  Neurological: Pt is alert and oriented to person, place, and time. Pt has normal reflexes. No cranial nerve deficit. Motor grossly intact, Gait intact Skin: Skin is warm and dry. No rash noted or new ulcerations Psychiatric:  Has normal mood and affect. Behavior is normal without agitation No other exam findings    Assessment & Plan:

## 2017-03-22 ENCOUNTER — Encounter: Payer: Self-pay | Admitting: Internal Medicine

## 2017-03-22 LAB — BASIC METABOLIC PANEL
BUN: 16 mg/dL (ref 6–23)
CO2: 26 mEq/L (ref 19–32)
CREATININE: 1.12 mg/dL (ref 0.40–1.50)
Calcium: 9.8 mg/dL (ref 8.4–10.5)
Chloride: 100 mEq/L (ref 96–112)
GFR: 76.14 mL/min (ref >60.00)
Glucose, Bld: 97 mg/dL (ref 70–99)
POTASSIUM: 4.1 meq/L (ref 3.5–5.1)
Sodium: 138 mEq/L (ref 135–145)

## 2017-03-22 LAB — HEPATIC FUNCTION PANEL
ALBUMIN: 5 g/dL (ref 3.5–5.2)
ALK PHOS: 65 U/L (ref 39–117)
ALT: 62 U/L — AB (ref 0–53)
AST: 32 U/L (ref 0–37)
BILIRUBIN DIRECT: 0.1 mg/dL (ref 0.0–0.3)
TOTAL PROTEIN: 7.3 g/dL (ref 6.0–8.3)
Total Bilirubin: 0.2 mg/dL (ref 0.2–1.2)

## 2017-03-22 LAB — LIPID PANEL
Cholesterol: 211 mg/dL — ABNORMAL HIGH (ref 0–200)
HDL: 29.3 mg/dL — ABNORMAL LOW (ref >39.00)
Total CHOL/HDL Ratio: 7

## 2017-03-22 LAB — LDL CHOLESTEROL, DIRECT: Direct LDL: 112 mg/dL

## 2017-03-23 ENCOUNTER — Encounter: Payer: Self-pay | Admitting: Internal Medicine

## 2017-03-23 NOTE — Assessment & Plan Note (Signed)

## 2017-09-08 ENCOUNTER — Other Ambulatory Visit: Payer: Self-pay | Admitting: Gastroenterology

## 2017-09-17 ENCOUNTER — Telehealth: Payer: Self-pay | Admitting: Gastroenterology

## 2017-09-17 NOTE — Telephone Encounter (Signed)
Patient states he needs medication lialda refilled and would like it sent to [facility]. Patient states if he needs to sch follow up, just let him know, but he is already out of medication.

## 2017-09-17 NOTE — Telephone Encounter (Signed)
Left a message for patient to return my call. 

## 2017-09-18 ENCOUNTER — Telehealth: Payer: Self-pay | Admitting: Gastroenterology

## 2017-09-18 MED ORDER — MESALAMINE 1.2 G PO TBEC: 2.4000 g | DELAYED_RELEASE_TABLET | Freq: Two times a day (BID) | ORAL | 1 refills | Status: DC

## 2017-09-18 NOTE — Telephone Encounter (Signed)
Informed patient we do need to schedule a follow-up visit and that he is over due. Patient scheduled appt on 11/01/17 at 8:30am. Prescription sent to patient's pharmacy until scheduled appt.

## 2017-09-18 NOTE — Telephone Encounter (Signed)
See phone note from 09/18/17

## 2017-10-10 ENCOUNTER — Other Ambulatory Visit: Payer: Self-pay | Admitting: Gastroenterology

## 2017-10-24 ENCOUNTER — Telehealth: Payer: Self-pay | Admitting: Gastroenterology

## 2017-10-24 ENCOUNTER — Other Ambulatory Visit: Payer: Self-pay | Admitting: Gastroenterology

## 2017-10-24 MED ORDER — MESALAMINE 1.2 G PO TBEC: 2.4000 g | DELAYED_RELEASE_TABLET | Freq: Two times a day (BID) | ORAL | 0 refills | Status: DC

## 2017-10-24 NOTE — Telephone Encounter (Signed)
Prescription for Lialda sent to patient's pharmacy until scheduled appt.

## 2017-11-01 ENCOUNTER — Encounter: Payer: Self-pay | Admitting: Gastroenterology

## 2017-11-01 ENCOUNTER — Ambulatory Visit (INDEPENDENT_AMBULATORY_CARE_PROVIDER_SITE_OTHER): Payer: BLUE CROSS/BLUE SHIELD | Admitting: Gastroenterology

## 2017-11-01 VITALS — BP 96/70 | HR 64 | Ht 73.0 in | Wt 172.5 lb

## 2017-11-01 DIAGNOSIS — K5130 Ulcerative (chronic) rectosigmoiditis without complications: Secondary | ICD-10-CM | POA: Diagnosis not present

## 2017-11-01 MED ORDER — MESALAMINE 1.2 G PO TBEC: 2.4000 g | DELAYED_RELEASE_TABLET | Freq: Two times a day (BID) | ORAL | 3 refills | Status: DC

## 2017-11-01 NOTE — Patient Instructions (Signed)
We have sent the following medications to your pharmacy for you to pick up at your convenience: Omro.   Normal BMI (Body Mass Index- based on height and weight) is between 19 and 25. Your BMI today is Body mass index is 22.76 kg/m. Marland Kitchen Please consider follow up  regarding your BMI with your Primary Care Provider.  Thank you for choosing me and [facility].  Pricilla Riffle. Dagoberto Ligas., MD., Marval Regal

## 2017-11-01 NOTE — Progress Notes (Signed)
    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  He states that his symptoms have been completely controlled on Lialda 4.8 g daily.  He has 2-3 formed bowel movements per day without cramping, bleeding, mucus, straining, urgency.  Blood work from March 2019 was reviewed.  Current Medications, Allergies, Past Medical History, Past Surgical History, Family History and Social History were reviewed in [employer] record.  Physical Exam: General: Well developed, well nourished, no acute distress Head: Normocephalic and atraumatic Eyes:  sclerae anicteric, EOMI Ears: Normal auditory acuity Mouth: No deformity or lesions Lungs: Clear throughout to auscultation Heart: Regular rate and rhythm; no murmurs, rubs or bruits Abdomen: Soft, non tender and non distended. No masses, hepatosplenomegaly or hernias noted. Normal Bowel sounds Rectal: Not done  Musculoskeletal: Symmetrical with no gross deformities  Pulses:  Normal pulses noted Extremities: No clubbing, cyanosis, edema or deformities noted Neurological: Alert oriented x 4, grossly nonfocal Psychological:  Alert and cooperative. Normal mood and affect   Assessment and Recommendations:  1.  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  History of IBS symptoms currently not active. His blood work from March 2019 was reviewed, unremarkable.  Continue Lialda 2.4 g twice daily. REV in 1 year.

## 2018-02-06 ENCOUNTER — Ambulatory Visit (INDEPENDENT_AMBULATORY_CARE_PROVIDER_SITE_OTHER): Payer: BLUE CROSS/BLUE SHIELD | Admitting: Internal Medicine

## 2018-02-06 ENCOUNTER — Encounter: Payer: Self-pay | Admitting: Internal Medicine

## 2018-02-06 VITALS — BP 144/82 | HR 82 | Temp 98.2°F | Ht 73.0 in | Wt 174.0 lb

## 2018-02-06 DIAGNOSIS — K529 Noninfective gastroenteritis and colitis, unspecified: Secondary | ICD-10-CM | POA: Diagnosis not present

## 2018-02-06 DIAGNOSIS — K589 Irritable bowel syndrome without diarrhea: Secondary | ICD-10-CM | POA: Diagnosis not present

## 2018-02-06 DIAGNOSIS — K4090 Unilateral inguinal hernia, without obstruction or gangrene, not specified as recurrent: Secondary | ICD-10-CM | POA: Diagnosis not present

## 2018-02-06 NOTE — Progress Notes (Signed)
Subjective:    Patient ID: Curtis [name], male    DOB: 1975/08/31, 43 y.o.   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  Denies worsening reflux, abd pain, dysphagia, n/v, bowel change or blood.  Pt denies fever, wt loss, night sweats, loss of appetite, or other constitutional symptoms  Has hx of RIH repair 10 yrs ago, thinks this may be similar Past Medical History:  Diagnosis Date  . ACNE, CYSTIC    resolved  . Arthritis    spine, early onset  . IBS (irritable bowel syndrome)   . INGUINAL HERNIA, RIGHT 04/24/2008   s/p RIH repair  . Lactose intolerance    diet controlled   Past Surgical History:  Procedure Laterality Date  . COLONOSCOPY    . HERNIA REPAIR Right 2010   Dr. Grandville Silos  . Surgery for spont pneumothorax Right 1996  . WISDOM TOOTH EXTRACTION      reports that he has never smoked. He has never used smokeless tobacco. He reports current alcohol use of about 3.0 standard drinks of alcohol per week. He reports current drug use. Drug: Marijuana. family history includes Stroke (age of onset: 43) in his maternal grandmother; Urolithiasis in his father. No Known Allergies Current Outpatient Medications on File Prior to Visit  Medication Sig Dispense Refill  . mesalamine (LIALDA) 1.2 g EC tablet Take 2 tablets (2.4 g total) by mouth 2 (two) times daily. 360 tablet 3   No current facility-administered medications on file prior to visit.    Review of Systems  Constitutional: Negative for other unusual diaphoresis or sweats HENT: Negative for ear discharge or swelling Eyes: Negative for other worsening visual disturbances Respiratory: Negative for stridor  or other swelling  Gastrointestinal: Negative for worsening distension or other blood Genitourinary: Negative for retention or other urinary change Musculoskeletal: Negative for other MSK pain or swelling Skin: Negative for color change or other new lesions Neurological: Negative for worsening tremors and other numbness  Psychiatric/Behavioral: Negative for worsening agitation or other fatigue All other system neg per pt    Objective:   Physical Exam BP (!) 144/82   Pulse 82   Temp 98.2 F (36.8 C) (Oral)   Ht 6' 1"  (1.854 m)   Wt 174 lb (78.9 kg)   SpO2 97%   BMI 22.96 kg/m  VS noted,  Constitutional: Pt appears in NAD HENT: Head: NCAT.  Right Ear: External ear normal.  Left Ear: External ear normal.  Eyes: . Pupils are equal, round, and reactive to light. Conjunctivae and EOM are normal Nose: without d/c or deformity Neck: Neck supple. Gross normal ROM Cardiovascular: Normal rate and regular rhythm.   Pulmonary/Chest: Effort normal and breath sounds without rales or wheezing.  Abd:  Soft, NT, ND, + BS, no organomegaly Left inguinal area with mild tender reducible inguinal hernia Neurological: Pt is alert. At baseline orientation, motor grossly intact Skin: Skin is warm. No rashes, other new lesions, no LE edema Psychiatric: Pt behavior is normal without agitation  No other exam findings Lab Results  Component Value Date   WBC 7.4 03/21/2017   HGB 15.2 03/21/2017   HCT 44.1 03/21/2017   PLT 212.0 03/21/2017  GLUCOSE 97 03/21/2017   CHOL 211 (H) 03/21/2017   TRIG (H) 03/21/2017    555.0 Triglyceride is over 400; calculations on Lipids are invalid.   HDL 29.30 (L) 03/21/2017   LDLDIRECT 112.0 03/21/2017   LDLCALC 109 (H) 08/23/2015   ALT 62 (H) 03/21/2017   AST 32 03/21/2017   NA 138 03/21/2017   K 4.1 03/21/2017   CL 100 03/21/2017   CREATININE 1.12 03/21/2017   BUN 16 03/21/2017   CO2 26 03/21/2017   TSH 1.33 03/21/2017   PSA 1.64 03/21/2017   HGBA1C 5.5  11/12/2014        Assessment & Plan:

## 2018-02-06 NOTE — Patient Instructions (Signed)
Please continue all other medications as before, and refills have been done if requested.  Please have the pharmacy call with any other refills you may need.  Please keep your appointments with your specialists as you may have planned  You will be contacted regarding the referral for: General Surgury

## 2018-02-06 NOTE — Assessment & Plan Note (Signed)
Mild tender but recurring pain, not incarcerated, for general surgury referral

## 2018-02-06 NOTE — Assessment & Plan Note (Signed)
stable overall by history and exam, recent data reviewed with pt, and pt to continue medical treatment as before,  to f/u any worsening symptoms or concerns  

## 2018-02-19 ENCOUNTER — Ambulatory Visit: Payer: Self-pay | Admitting: Surgery

## 2018-02-19 DIAGNOSIS — K4090 Unilateral inguinal hernia, without obstruction or gangrene, not specified as recurrent: Secondary | ICD-10-CM | POA: Diagnosis not present

## 2018-02-19 NOTE — H&P (View-Only) (Signed)
Curtis [name] Documented: 02/19/2018 8:57 AM Location: [facility] Patient #: [id number] DOB: 01/23/1975 Single / Language: Curtis [name] / Race: White Male  History of Present Illness Curtis Hector MD; 02/19/2018 9:23 AM) The patient is a 43 year old male who presents with an inguinal hernia. Note for "Inguinal hernia": ` ` ` Patient sent for surgical consultation at the request of Curtis [name]  Chief Complaint: Left inguinal hernia with increasing discomfort consider surgery. ` ` The patient is a pleasant active thin male who is noticed some intermittent groin pain and bulging in the left side. Has a history of irritable bowel and question colitis followed by Curtis. Fuller [name] with gastroenterology. He thought it was mainly related that however became more focal. He mentioned it to his primary care physician. Patient does have a history of a right inguinal hernia that required an open repair in 2010 my partner, Curtis [name]. Given the history, physical exam was suspicious for left hernia. Curtis [name] requested surgical evaluation.  Patient is otherwise rather physically active. No cardiopulmonary issues. He does not smoke tobacco. Aside from the open right inguinal hernia repair in 2010, he's not had any other surgeries. He occasionally has loose bowel movements but no severe diarrhea or cramping. Alamine seems to be helping stabilizes bowels. No history of infection or UTI. No diabetes.  (Review of systems as stated in this history (HPI) or in the review of systems. Otherwise all other 12 point ROS are negative) ` ` `   Past Surgical History Curtis [name], CMA; 02/19/2018 8:58 AM) Colon Polyp Removal - Colonoscopy Open Inguinal Hernia Surgery Left.  Diagnostic Studies History Curtis [name], [place]; 02/19/2018 8:58 AM) Colonoscopy within last year  Allergies Curtis [name], [place]; 02/19/2018 8:59 AM) No Known Allergies [02/19/2018]:  Medication History (Curtis  [name], CMA; 02/19/2018 8:59 AM) Mesalamine (1.2GM Tablet Curtis, Oral) Active. Medications Reconciled  Social History Curtis [name], [place]; 02/19/2018 8:58 AM) Alcohol use Occasional alcohol use. Illicit drug use Uses daily. No caffeine use Tobacco use Former smoker.  Family History Curtis [name], [place]; 02/19/2018 8:58 AM) Hypertension Father.  Other Problems Curtis [name], [place]; 02/19/2018 8:58 AM) Crohn's Disease Inguinal Hernia     Review of Systems (Curtis [name] CMA; 02/19/2018 8:58 AM) General Not Present- Appetite Loss, Chills, Fatigue, Fever, Night Sweats, Weight Gain and Weight Loss. Skin Not Present- Change in Wart/Mole, Dryness, Hives, Jaundice, New Lesions, Non-Healing Wounds, Rash and Ulcer. HEENT Present- Seasonal Allergies and Wears glasses/contact lenses. Not Present- Earache, Hearing Loss, Hoarseness, Nose Bleed, Oral Ulcers, Ringing in the Ears, Sinus Pain, Sore Throat, Visual Disturbances and Yellow Eyes. Respiratory Present- Snoring. Not Present- Bloody sputum, Chronic Cough, Difficulty Breathing and Wheezing. Breast Not Present- Breast Mass, Breast Pain, Nipple Discharge and Skin Changes. Cardiovascular Not Present- Chest Pain, Difficulty Breathing Lying Down, Leg Cramps, Palpitations, Rapid Heart Rate, Shortness of Breath and Swelling of Extremities. Gastrointestinal Present- Abdominal Pain, Bloating and Constipation. Not Present- Bloody Stool, Change in Bowel Habits, Chronic diarrhea, Difficulty Swallowing, Excessive gas, Gets full quickly at meals, Hemorrhoids, Indigestion, Nausea, Rectal Pain and Vomiting. Male Genitourinary Present- Change in Urinary Stream. Not Present- Blood in Urine, Frequency, Impotence, Nocturia, Painful Urination, Urgency and Urine Leakage.  Vitals (Curtis [name] CMA; 02/19/2018 8:58 AM) 02/19/2018 8:58 AM Weight: 173.13 lb Height: 73in Body Surface Area: 2.02 m Body Mass Index: 22.84 kg/m  Temp.: 98.89F  Pulse: 77  (Regular)  P.OX: 98% (Room air) BP: 136/80 (Sitting, Left Arm, Standard)  Physical Exam Curtis Hector MD; 02/19/2018 9:23 AM)  General Mental Status-Alert. General Appearance-Not in acute distress, Not Sickly. Orientation-Oriented X3. Hydration-Well hydrated. Voice-Normal. Note: Pleasant. Mildly anxious/ self-deprecating but consolable. Inquisitive.  Integumentary Global Assessment Upon inspection and palpation of skin surfaces of the - Axillae: non-tender, no inflammation or ulceration, no drainage. and Distribution of scalp and body hair is normal. General Characteristics Temperature - normal warmth is noted.  Head and Neck Head-normocephalic, atraumatic with no lesions or palpable masses. Face Global Assessment - atraumatic, no absence of expression. Neck Global Assessment - no abnormal movements, no bruit auscultated on the right, no bruit auscultated on the left, no decreased range of motion, non-tender. Trachea-midline. Thyroid Gland Characteristics - non-tender.  Eye Eyeball - Left-Extraocular movements intact, No Nystagmus. Eyeball - Right-Extraocular movements intact, No Nystagmus. Cornea - Left-No Hazy. Cornea - Right-No Hazy. Sclera/Conjunctiva - Left-No scleral icterus, No Discharge. Sclera/Conjunctiva - Right-No scleral icterus, No Discharge. Pupil - Left-Direct reaction to light normal. Pupil - Right-Direct reaction to light normal. Note: Wears glasses. Vision corrected  ENMT Ears Pinna - Left - no drainage observed, no generalized tenderness observed. Right - no drainage observed, no generalized tenderness observed. Nose and Sinuses External Inspection of the Nose - no destructive lesion observed. Inspection of the nares - Left - quiet respiration. Right - quiet respiration. Mouth and Throat Lips - Upper Lip - no fissures observed, no pallor noted. Lower Lip - no fissures observed, no pallor noted. Nasopharynx -  no discharge present. Oral Cavity/Oropharynx - Tongue - no dryness observed. Oral Mucosa - no cyanosis observed. Hypopharynx - no evidence of airway distress observed.  Chest and Lung Exam Inspection Movements - Normal and Symmetrical. Accessory muscles - No use of accessory muscles in breathing. Palpation Palpation of the chest reveals - Non-tender. Auscultation Breath sounds - Normal and Clear.  Cardiovascular Auscultation Rhythm - Regular. Murmurs & Other Heart Sounds - Auscultation of the heart reveals - No Murmurs and No Systolic Clicks.  Abdomen Inspection Inspection of the abdomen reveals - No Visible peristalsis and No Abnormal pulsations. Umbilicus - No Bleeding, No Urine drainage. Palpation/Percussion Palpation and Percussion of the abdomen reveal - Soft, Non Tender, No Rebound tenderness, No Rigidity (guarding) and No Cutaneous hyperesthesia. Note: Abdomen soft. Nontender. Not distended. No umbilical or incisional hernias. No guarding.  Male Genitourinary Sexual Maturity Tanner 5 - Adult hair pattern and Adult penile size and shape. Note: Mild left lateral groin bulging with soft reducible mass. Rather sensitive. Increases with cough. Consistent with left inguinal hernia.  Oblique incision consistent with prior hernia repair. Mild fullness at right external ring consistent with prior open hernia repair. No evidence of hernia recurrence. Otherwise normal external male genitalia  Peripheral Vascular Upper Extremity Inspection - Left - No Cyanotic nailbeds, Not Ischemic. Right - No Cyanotic nailbeds, Not Ischemic.  Neurologic Neurologic evaluation reveals -normal attention span and ability to concentrate, able to name objects and repeat phrases. Appropriate fund of knowledge , normal sensation and normal coordination. Mental Status Affect - not angry, not paranoid. Cranial Nerves-Normal Bilaterally. Gait-Normal.  Neuropsychiatric Mental status exam  performed with findings of-able to articulate well with normal speech/language, rate, volume and coherence, thought content normal with ability to perform basic computations and apply abstract reasoning and no evidence of hallucinations, delusions, obsessions or homicidal/suicidal ideation.  Musculoskeletal Global Assessment Spine, Ribs and Pelvis - no instability, subluxation or laxity. Right Upper Extremity - no instability, subluxation or laxity.  Lymphatic Head & Neck  General Head & Neck Lymphatics: Bilateral - Description - No Localized lymphadenopathy. Axillary  General Axillary Region: Bilateral - Description - No Localized lymphadenopathy. Femoral & Inguinal  Generalized Femoral & Inguinal Lymphatics: Left - Description - No Localized lymphadenopathy. Right - Description - No Localized lymphadenopathy.    Assessment & [name] Curtis Hector MD; 02/19/2018 9:20 AM)  LEFT INGUINAL HERNIA (K40.90) Impression: Classic story and physical exam for left inguinal hernia in a patient with a prior open inguinal hernia repair with mesh by my partner, Curtis [name], and 2010.  I think he would benefit from laparoscopic exploration and repair of hernias found. He has no symptoms or evidence recurrence on the right side. He is interested in proceeding. We will work to coordinate a convenient time   [facility] SURGICAL PROCEDURE (Z01.818)  Current Plans You are being scheduled for surgery- Our schedulers will call you.  You should hear from our office's scheduling department within 5 working days about the location, date, and time of surgery. We try to make accommodations for patient's preferences in scheduling surgery, but sometimes the OR schedule or the surgeon's schedule prevents [place] from making those accommodations.  If you have not heard from our office [phone]) in 5 working days, call the office and ask for your  surgeon's nurse.  If you have other questions about your diagnosis, [name], or surgery, call the office and ask for your surgeon's nurse.  Written instructions provided The anatomy & physiology of the abdominal wall and pelvic floor was discussed. The pathophysiology of hernias in the inguinal and pelvic region was discussed. Natural history risks such as progressive enlargement, pain, incarceration, and strangulation was discussed. Contributors to complications such as smoking, obesity, diabetes, prior surgery, etc were discussed.  I feel the risks of no intervention will lead to serious problems that outweigh the operative risks; therefore, I recommended surgery to reduce and repair the hernia. I explained laparoscopic techniques with possible need for an open approach. I noted usual use of mesh to patch and/or buttress hernia repair  Risks such as bleeding, infection, abscess, need for further treatment, heart attack, death, and other risks were discussed. I noted a good likelihood this will help address the problem. Goals of post-operative recovery were discussed as well. Possibility that this will not correct all symptoms was explained. I stressed the importance of low-impact activity, aggressive pain control, avoiding constipation, & not pushing through pain to minimize risk of post-operative chronic pain or injury. Possibility of reherniation was discussed. We will work to minimize complications.  An educational handout further explaining the pathology & treatment options was given as well. Questions were answered. The patient expresses understanding & wishes to proceed with surgery.  Pt Education - Pamphlet Given - Laparoscopic Hernia Repair: discussed with patient and provided information. Pt Education - [facility] Pain Control (Naomee Nowland) Pt Education - [facility] Hernia Post-Op HCI (Nakyra Bourn): discussed with patient and provided information. Pt Education - [facility] Mesh education: discussed with  patient and provided information.  Curtis Hector, MD, FACS, MASCRS Gastrointestinal and Minimally Invasive Surgery    [address] [place], [place] [address] [phone] Main / Paging [phone] Fax

## 2018-02-19 NOTE — H&P (Signed)
Curtis [name] Documented: 02/19/2018 8:57 AM Location: [facility] Patient #: [id number] DOB: 1975/09/28 Single / Language: Curtis [name] / Race: White Male  History of Present Illness Curtis Hector MD; 02/19/2018 9:23 AM) The patient is a 43 year old male who presents with an inguinal hernia. Note for "Inguinal hernia": ` ` ` Patient sent for surgical consultation at the request of Dr Cathlean Cower  Chief Complaint: Left inguinal hernia with increasing discomfort consider surgery. ` ` The patient is a pleasant active thin male who is noticed some intermittent groin pain and bulging in the left side. Has a history of irritable bowel and question colitis followed by Dr. Fuller Plan with gastroenterology. He thought it was mainly related that however became more focal. He mentioned it to his primary care physician. Patient does have a history of a right inguinal hernia that required an open repair in 2010 my partner, Dr. Grandville Silos. Given the history, physical exam was suspicious for left hernia. Dr. Jenny Reichmann requested surgical evaluation.  Patient is otherwise rather physically active. No cardiopulmonary issues. He does not smoke tobacco. Aside from the open right inguinal hernia repair in 2010, he's not had any other surgeries. He occasionally has loose bowel movements but no severe diarrhea or cramping. Alamine seems to be helping stabilizes bowels. No history of infection or UTI. No diabetes.  (Review of systems as stated in this history (HPI) or in the review of systems. Otherwise all other 12 point ROS are negative) ` ` `   Past Surgical History Andreas Blower, CMA; 02/19/2018 8:58 AM) Colon Polyp Removal - Colonoscopy Open Inguinal Hernia Surgery Left.  Diagnostic Studies History Andreas Blower, [place]; 02/19/2018 8:58 AM) Colonoscopy within last year  Allergies Andreas Blower, [place]; 02/19/2018 8:59 AM) No Known Allergies [02/19/2018]:  Medication History (Armen  Ferguson, CMA; 02/19/2018 8:59 AM) Mesalamine (1.2GM Tablet DR, Oral) Active. Medications Reconciled  Social History Andreas Blower, [place]; 02/19/2018 8:58 AM) Alcohol use Occasional alcohol use. Illicit drug use Uses daily. No caffeine use Tobacco use Former smoker.  Family History Andreas Blower, [place]; 02/19/2018 8:58 AM) Hypertension Father.  Other Problems Andreas Blower, [place]; 02/19/2018 8:58 AM) Crohn's Disease Inguinal Hernia     Review of Systems (Armen Ferguson CMA; 02/19/2018 8:58 AM) General Not Present- Appetite Loss, Chills, Fatigue, Fever, Night Sweats, Weight Gain and Weight Loss. Skin Not Present- Change in Wart/Mole, Dryness, Hives, Jaundice, New Lesions, Non-Healing Wounds, Rash and Ulcer. HEENT Present- Seasonal Allergies and Wears glasses/contact lenses. Not Present- Earache, Hearing Loss, Hoarseness, Nose Bleed, Oral Ulcers, Ringing in the Ears, Sinus Pain, Sore Throat, Visual Disturbances and Yellow Eyes. Respiratory Present- Snoring. Not Present- Bloody sputum, Chronic Cough, Difficulty Breathing and Wheezing. Breast Not Present- Breast Mass, Breast Pain, Nipple Discharge and Skin Changes. Cardiovascular Not Present- Chest Pain, Difficulty Breathing Lying Down, Leg Cramps, Palpitations, Rapid Heart Rate, Shortness of Breath and Swelling of Extremities. Gastrointestinal Present- Abdominal Pain, Bloating and Constipation. Not Present- Bloody Stool, Change in Bowel Habits, Chronic diarrhea, Difficulty Swallowing, Excessive gas, Gets full quickly at meals, Hemorrhoids, Indigestion, Nausea, Rectal Pain and Vomiting. Male Genitourinary Present- Change in Urinary Stream. Not Present- Blood in Urine, Frequency, Impotence, Nocturia, Painful Urination, Urgency and Urine Leakage.  Vitals (Armen Ferguson CMA; 02/19/2018 8:58 AM) 02/19/2018 8:58 AM Weight: 173.13 lb Height: 73in Body Surface Area: 2.02 m Body Mass Index: 22.84 kg/m  Temp.: 98.64F  Pulse: 77  (Regular)  P.OX: 98% (Room air) BP: 136/80 (Sitting, Left Arm, Standard)  Physical Exam Curtis Hector MD; 02/19/2018 9:23 AM)  General Mental Status-Alert. General Appearance-Not in acute distress, Not Sickly. Orientation-Oriented X3. Hydration-Well hydrated. Voice-Normal. Note: Pleasant. Mildly anxious/ self-deprecating but consolable. Inquisitive.  Integumentary Global Assessment Upon inspection and palpation of skin surfaces of the - Axillae: non-tender, no inflammation or ulceration, no drainage. and Distribution of scalp and body hair is normal. General Characteristics Temperature - normal warmth is noted.  Head and Neck Head-normocephalic, atraumatic with no lesions or palpable masses. Face Global Assessment - atraumatic, no absence of expression. Neck Global Assessment - no abnormal movements, no bruit auscultated on the right, no bruit auscultated on the left, no decreased range of motion, non-tender. Trachea-midline. Thyroid Gland Characteristics - non-tender.  Eye Eyeball - Left-Extraocular movements intact, No Nystagmus. Eyeball - Right-Extraocular movements intact, No Nystagmus. Cornea - Left-No Hazy. Cornea - Right-No Hazy. Sclera/Conjunctiva - Left-No scleral icterus, No Discharge. Sclera/Conjunctiva - Right-No scleral icterus, No Discharge. Pupil - Left-Direct reaction to light normal. Pupil - Right-Direct reaction to light normal. Note: Wears glasses. Vision corrected  ENMT Ears Pinna - Left - no drainage observed, no generalized tenderness observed. Right - no drainage observed, no generalized tenderness observed. Nose and Sinuses External Inspection of the Nose - no destructive lesion observed. Inspection of the nares - Left - quiet respiration. Right - quiet respiration. Mouth and Throat Lips - Upper Lip - no fissures observed, no pallor noted. Lower Lip - no fissures observed, no pallor noted. Nasopharynx -  no discharge present. Oral Cavity/Oropharynx - Tongue - no dryness observed. Oral Mucosa - no cyanosis observed. Hypopharynx - no evidence of airway distress observed.  Chest and Lung Exam Inspection Movements - Normal and Symmetrical. Accessory muscles - No use of accessory muscles in breathing. Palpation Palpation of the chest reveals - Non-tender. Auscultation Breath sounds - Normal and Clear.  Cardiovascular Auscultation Rhythm - Regular. Murmurs & Other Heart Sounds - Auscultation of the heart reveals - No Murmurs and No Systolic Clicks.  Abdomen Inspection Inspection of the abdomen reveals - No Visible peristalsis and No Abnormal pulsations. Umbilicus - No Bleeding, No Urine drainage. Palpation/Percussion Palpation and Percussion of the abdomen reveal - Soft, Non Tender, No Rebound tenderness, No Rigidity (guarding) and No Cutaneous hyperesthesia. Note: Abdomen soft. Nontender. Not distended. No umbilical or incisional hernias. No guarding.  Male Genitourinary Sexual Maturity Tanner 5 - Adult hair pattern and Adult penile size and shape. Note: Mild left lateral groin bulging with soft reducible mass. Rather sensitive. Increases with cough. Consistent with left inguinal hernia.  Oblique incision consistent with prior hernia repair. Mild fullness at right external ring consistent with prior open hernia repair. No evidence of hernia recurrence. Otherwise normal external male genitalia  Peripheral Vascular Upper Extremity Inspection - Left - No Cyanotic nailbeds, Not Ischemic. Right - No Cyanotic nailbeds, Not Ischemic.  Neurologic Neurologic evaluation reveals -normal attention span and ability to concentrate, able to name objects and repeat phrases. Appropriate fund of knowledge , normal sensation and normal coordination. Mental Status Affect - not angry, not paranoid. Cranial Nerves-Normal Bilaterally. Gait-Normal.  Neuropsychiatric Mental status exam  performed with findings of-able to articulate well with normal speech/language, rate, volume and coherence, thought content normal with ability to perform basic computations and apply abstract reasoning and no evidence of hallucinations, delusions, obsessions or homicidal/suicidal ideation.  Musculoskeletal Global Assessment Spine, Ribs and Pelvis - no instability, subluxation or laxity. Right Upper Extremity - no instability, subluxation or laxity.  Lymphatic Head & Neck  General Head & Neck Lymphatics: Bilateral - Description - No Localized lymphadenopathy. Axillary  General Axillary Region: Bilateral - Description - No Localized lymphadenopathy. Femoral & Inguinal  Generalized Femoral & Inguinal Lymphatics: Left - Description - No Localized lymphadenopathy. Right - Description - No Localized lymphadenopathy.    Assessment & Plan Curtis Hector MD; 02/19/2018 9:20 AM)  LEFT INGUINAL HERNIA (K40.90) Impression: Classic story and physical exam for left inguinal hernia in a patient with a prior open inguinal hernia repair with mesh by my partner, Dr. Grandville Silos, and 2010.  I think he would benefit from laparoscopic exploration and repair of hernias found. He has no symptoms or evidence recurrence on the right side. He is interested in proceeding. We will work to coordinate a convenient time   [place] SURGICAL PROCEDURE (Z01.818)  Current Plans You are being scheduled for surgery- Our schedulers will call you.  You should hear from our office's scheduling department within 5 working days about the location, date, and time of surgery. We try to make accommodations for patient's preferences in scheduling surgery, but sometimes the OR schedule or the surgeon's schedule prevents [place] from making those accommodations.  If you have not heard from our office [phone]) in 5 working days, call the office and ask for your  surgeon's nurse.  If you have other questions about your diagnosis, plan, or surgery, call the office and ask for your surgeon's nurse.  Written instructions provided The anatomy & physiology of the abdominal wall and pelvic floor was discussed. The pathophysiology of hernias in the inguinal and pelvic region was discussed. Natural history risks such as progressive enlargement, pain, incarceration, and strangulation was discussed. Contributors to complications such as smoking, obesity, diabetes, prior surgery, etc were discussed.  I feel the risks of no intervention will lead to serious problems that outweigh the operative risks; therefore, I recommended surgery to reduce and repair the hernia. I explained laparoscopic techniques with possible need for an open approach. I noted usual use of mesh to patch and/or buttress hernia repair  Risks such as bleeding, infection, abscess, need for further treatment, heart attack, death, and other risks were discussed. I noted a good likelihood this will help address the problem. Goals of post-operative recovery were discussed as well. Possibility that this will not correct all symptoms was explained. I stressed the importance of low-impact activity, aggressive pain control, avoiding constipation, & not pushing through pain to minimize risk of post-operative chronic pain or injury. Possibility of reherniation was discussed. We will work to minimize complications.  An educational handout further explaining the pathology & treatment options was given as well. Questions were answered. The patient expresses understanding & wishes to proceed with surgery.  Pt Education - Pamphlet Given - Laparoscopic Hernia Repair: discussed with patient and provided information. Pt Education - [facility] Pain Control (Davie Claud) Pt Education - [facility] Hernia Post-Op HCI (Elishah Ashmore): discussed with patient and provided information. Pt Education - [facility] Mesh education: discussed with  patient and provided information.  Curtis Hector, MD, FACS, MASCRS Gastrointestinal and Minimally Invasive Surgery    [address] [place], [place] [address] [phone] Main / Paging [phone] Fax

## 2018-03-11 ENCOUNTER — Other Ambulatory Visit: Payer: Self-pay

## 2018-03-11 ENCOUNTER — Encounter (HOSPITAL_BASED_OUTPATIENT_CLINIC_OR_DEPARTMENT_OTHER): Payer: Self-pay | Admitting: *Deleted

## 2018-03-11 NOTE — Progress Notes (Signed)
Spoke with patient via telephone for pre op interview. NPO after MN. No medications AM of surgery. Arrival time 33.

## 2018-03-14 ENCOUNTER — Other Ambulatory Visit: Payer: Self-pay

## 2018-03-14 ENCOUNTER — Encounter (HOSPITAL_BASED_OUTPATIENT_CLINIC_OR_DEPARTMENT_OTHER): Payer: Self-pay | Admitting: *Deleted

## 2018-03-14 ENCOUNTER — Encounter (HOSPITAL_BASED_OUTPATIENT_CLINIC_OR_DEPARTMENT_OTHER)
Admission: RE | Disposition: A | Discharge status: Home / Self Care | Payer: Self-pay | Source: Home / Self Care | Attending: Surgery

## 2018-03-14 ENCOUNTER — Ambulatory Visit (HOSPITAL_BASED_OUTPATIENT_CLINIC_OR_DEPARTMENT_OTHER): Payer: BLUE CROSS/BLUE SHIELD | Admitting: Anesthesiology

## 2018-03-14 ENCOUNTER — Ambulatory Visit (HOSPITAL_BASED_OUTPATIENT_CLINIC_OR_DEPARTMENT_OTHER)
Admission: RE | Admit: 2018-03-14 | Discharge: 2018-03-14 | Disposition: A | Discharge status: Home / Self Care | Payer: BLUE CROSS/BLUE SHIELD | Attending: Surgery | Admitting: Surgery

## 2018-03-14 DIAGNOSIS — K4091 Unilateral inguinal hernia, without obstruction or gangrene, recurrent: Secondary | ICD-10-CM | POA: Insufficient documentation

## 2018-03-14 DIAGNOSIS — Z79899 Other long term (current) drug therapy: Secondary | ICD-10-CM | POA: Diagnosis not present

## 2018-03-14 DIAGNOSIS — K4090 Unilateral inguinal hernia, without obstruction or gangrene, not specified as recurrent: Secondary | ICD-10-CM | POA: Diagnosis not present

## 2018-03-14 DIAGNOSIS — Z87891 Personal history of nicotine dependence: Secondary | ICD-10-CM | POA: Diagnosis not present

## 2018-03-14 DIAGNOSIS — D176 Benign lipomatous neoplasm of spermatic cord: Secondary | ICD-10-CM | POA: Diagnosis not present

## 2018-03-14 DIAGNOSIS — K660 Peritoneal adhesions (postprocedural) (postinfection): Secondary | ICD-10-CM | POA: Diagnosis not present

## 2018-03-14 DIAGNOSIS — K4021 Bilateral inguinal hernia, without obstruction or gangrene, recurrent: Secondary | ICD-10-CM | POA: Diagnosis not present

## 2018-03-14 DIAGNOSIS — K5090 Crohn's disease, unspecified, without complications: Secondary | ICD-10-CM | POA: Insufficient documentation

## 2018-03-14 DIAGNOSIS — R0602 Shortness of breath: Secondary | ICD-10-CM | POA: Diagnosis not present

## 2018-03-14 DIAGNOSIS — K429 Umbilical hernia without obstruction or gangrene: Secondary | ICD-10-CM | POA: Diagnosis not present

## 2018-03-14 HISTORY — DX: Nausea with vomiting, unspecified: R11.2

## 2018-03-14 HISTORY — PX: UMBILICAL HERNIA REPAIR: SHX196

## 2018-03-14 HISTORY — PX: INGUINAL HERNIA REPAIR: SHX194

## 2018-03-14 HISTORY — DX: Other specified postprocedural states: Z98.890

## 2018-03-14 SURGERY — REPAIR, HERNIA, INGUINAL, LAPAROSCOPIC
Anesthesia: General | Site: Abdomen

## 2018-03-14 MED ORDER — FENTANYL CITRATE (PF) 100 MCG/2ML IJ SOLN
INTRAMUSCULAR | Status: AC
  Filled 2018-03-14: qty 2

## 2018-03-14 MED ORDER — MIDAZOLAM HCL 2 MG/2ML IJ SOLN
INTRAMUSCULAR | Status: AC
  Filled 2018-03-14: qty 2

## 2018-03-14 MED ORDER — LIDOCAINE HCL 2 % IJ SOLN
INTRAMUSCULAR | Status: AC
  Filled 2018-03-14: qty 20

## 2018-03-14 MED ORDER — PROPOFOL 10 MG/ML IV BOLUS
INTRAVENOUS | Status: DC | PRN
  Administered 2018-03-14: 150 mg via INTRAVENOUS
  Administered 2018-03-14: 50 mg via INTRAVENOUS

## 2018-03-14 MED ORDER — KETAMINE HCL 10 MG/ML IJ SOLN
INTRAMUSCULAR | Status: DC | PRN
  Administered 2018-03-14 (×2): 10 mg via INTRAVENOUS
  Administered 2018-03-14: 40 mg via INTRAVENOUS

## 2018-03-14 MED ORDER — KETAMINE HCL 10 MG/ML IJ SOLN
INTRAMUSCULAR | Status: AC
  Filled 2018-03-14: qty 1

## 2018-03-14 MED ORDER — CHLORHEXIDINE GLUCONATE CLOTH 2 % EX PADS
6.0000 | MEDICATED_PAD | Freq: Once | CUTANEOUS | Status: DC
  Filled 2018-03-14: qty 6

## 2018-03-14 MED ORDER — ACETAMINOPHEN 650 MG RE SUPP
650.0000 mg | RECTAL | Status: DC | PRN
  Filled 2018-03-14: qty 1

## 2018-03-14 MED ORDER — ROCURONIUM BROMIDE 10 MG/ML (PF) SYRINGE
PREFILLED_SYRINGE | INTRAVENOUS | Status: DC | PRN
  Administered 2018-03-14: 60 mg via INTRAVENOUS
  Administered 2018-03-14: 10 mg via INTRAVENOUS

## 2018-03-14 MED ORDER — FENTANYL CITRATE (PF) 100 MCG/2ML IJ SOLN
25.0000 ug | INTRAMUSCULAR | Status: DC | PRN
  Filled 2018-03-14: qty 1

## 2018-03-14 MED ORDER — ACETAMINOPHEN 325 MG PO TABS
650.0000 mg | ORAL_TABLET | ORAL | Status: DC | PRN
  Filled 2018-03-14: qty 2

## 2018-03-14 MED ORDER — SUGAMMADEX SODIUM 200 MG/2ML IV SOLN
INTRAVENOUS | Status: DC | PRN
  Administered 2018-03-14: 200 mg via INTRAVENOUS

## 2018-03-14 MED ORDER — SODIUM CHLORIDE 0.9 % IV SOLN
250.0000 mL | INTRAVENOUS | Status: DC | PRN
  Filled 2018-03-14: qty 250

## 2018-03-14 MED ORDER — BUPIVACAINE LIPOSOME 1.3 % IJ SUSP
20.0000 mL | Freq: Once | INTRAMUSCULAR | Status: DC
  Filled 2018-03-14: qty 20

## 2018-03-14 MED ORDER — PROMETHAZINE HCL 25 MG/ML IJ SOLN
6.2500 mg | Freq: Once | INTRAMUSCULAR | Status: AC
  Administered 2018-03-14: 6.25 mg via INTRAVENOUS

## 2018-03-14 MED ORDER — CELECOXIB 200 MG PO CAPS
200.0000 mg | ORAL_CAPSULE | ORAL | Status: AC
  Administered 2018-03-14: 200 mg via ORAL
  Filled 2018-03-14: qty 1

## 2018-03-14 MED ORDER — ACETAMINOPHEN 500 MG PO TABS
1000.0000 mg | ORAL_TABLET | ORAL | Status: AC
  Administered 2018-03-14: 1000 mg via ORAL
  Filled 2018-03-14: qty 2

## 2018-03-14 MED ORDER — OXYCODONE HCL 5 MG PO TABS
5.0000 mg | ORAL_TABLET | ORAL | Status: DC | PRN
  Filled 2018-03-14: qty 2

## 2018-03-14 MED ORDER — PROPOFOL 10 MG/ML IV BOLUS
INTRAVENOUS | Status: AC
  Filled 2018-03-14: qty 20

## 2018-03-14 MED ORDER — FENTANYL CITRATE (PF) 100 MCG/2ML IJ SOLN
INTRAMUSCULAR | Status: DC | PRN
  Administered 2018-03-14 (×2): 50 ug via INTRAVENOUS

## 2018-03-14 MED ORDER — SCOPOLAMINE 1 MG/3DAYS TD PT72
MEDICATED_PATCH | TRANSDERMAL | Status: AC
  Filled 2018-03-14: qty 1

## 2018-03-14 MED ORDER — ONDANSETRON HCL 4 MG/2ML IJ SOLN
INTRAMUSCULAR | Status: AC
  Filled 2018-03-14: qty 2

## 2018-03-14 MED ORDER — DEXAMETHASONE SODIUM PHOSPHATE 10 MG/ML IJ SOLN
INTRAMUSCULAR | Status: DC | PRN
  Administered 2018-03-14: 8 mg via INTRAVENOUS

## 2018-03-14 MED ORDER — ROCURONIUM BROMIDE 100 MG/10ML IV SOLN
INTRAVENOUS | Status: AC
  Filled 2018-03-14: qty 1

## 2018-03-14 MED ORDER — SCOPOLAMINE 1 MG/3DAYS TD PT72
MEDICATED_PATCH | TRANSDERMAL | Status: DC | PRN
  Administered 2018-03-14: 1 via TRANSDERMAL

## 2018-03-14 MED ORDER — BUPIVACAINE-EPINEPHRINE 0.25% -1:200000 IJ SOLN
INTRAMUSCULAR | Status: DC | PRN
  Administered 2018-03-14: 60 mL

## 2018-03-14 MED ORDER — ACETAMINOPHEN 500 MG PO TABS
ORAL_TABLET | ORAL | Status: AC
  Filled 2018-03-14: qty 2

## 2018-03-14 MED ORDER — TRAMADOL HCL 50 MG PO TABS: 50.0000 mg | ORAL_TABLET | Freq: Four times a day (QID) | ORAL | 0 refills | Status: DC | PRN

## 2018-03-14 MED ORDER — BUPIVACAINE LIPOSOME 1.3 % IJ SUSP
INTRAMUSCULAR | Status: DC | PRN
  Administered 2018-03-14: 20 mL

## 2018-03-14 MED ORDER — BUPIVACAINE LIPOSOME 1.3 % IJ SUSP
INTRAMUSCULAR | Status: AC
  Filled 2018-03-14: qty 20

## 2018-03-14 MED ORDER — CEFAZOLIN SODIUM-DEXTROSE 2-4 GM/100ML-% IV SOLN
INTRAVENOUS | Status: AC
  Filled 2018-03-14: qty 100

## 2018-03-14 MED ORDER — ONDANSETRON HCL 4 MG/2ML IJ SOLN
4.0000 mg | Freq: Once | INTRAMUSCULAR | Status: AC
  Administered 2018-03-14: 4 mg via INTRAVENOUS
  Filled 2018-03-14: qty 2

## 2018-03-14 MED ORDER — SUGAMMADEX SODIUM 200 MG/2ML IV SOLN
INTRAVENOUS | Status: AC
  Filled 2018-03-14: qty 2

## 2018-03-14 MED ORDER — GABAPENTIN 300 MG PO CAPS
300.0000 mg | ORAL_CAPSULE | ORAL | Status: AC
  Administered 2018-03-14: 300 mg via ORAL
  Filled 2018-03-14: qty 1

## 2018-03-14 MED ORDER — CELECOXIB 200 MG PO CAPS
ORAL_CAPSULE | ORAL | Status: AC
  Filled 2018-03-14: qty 1

## 2018-03-14 MED ORDER — LIDOCAINE 2% (20 MG/ML) 5 ML SYRINGE
INTRAMUSCULAR | Status: DC | PRN
  Administered 2018-03-14: 1.5 mg/kg/h via INTRAVENOUS

## 2018-03-14 MED ORDER — MIDAZOLAM HCL 2 MG/2ML IJ SOLN
INTRAMUSCULAR | Status: DC | PRN
  Administered 2018-03-14: 2 mg via INTRAVENOUS

## 2018-03-14 MED ORDER — PROMETHAZINE HCL 25 MG/ML IJ SOLN
INTRAMUSCULAR | Status: AC
  Filled 2018-03-14: qty 1

## 2018-03-14 MED ORDER — LACTATED RINGERS IV SOLN
INTRAVENOUS | Status: DC
  Administered 2018-03-14 (×3): via INTRAVENOUS
  Filled 2018-03-14: qty 1000

## 2018-03-14 MED ORDER — NAPROXEN 500 MG PO TABS: 500.0000 mg | ORAL_TABLET | Freq: Two times a day (BID) | ORAL | 1 refills | Status: DC | PRN

## 2018-03-14 MED ORDER — LIDOCAINE 2% (20 MG/ML) 5 ML SYRINGE
INTRAMUSCULAR | Status: AC
  Filled 2018-03-14: qty 5

## 2018-03-14 MED ORDER — ONDANSETRON HCL 4 MG/2ML IJ SOLN
INTRAMUSCULAR | Status: DC | PRN
  Administered 2018-03-14: 4 mg via INTRAVENOUS

## 2018-03-14 MED ORDER — CEFAZOLIN SODIUM-DEXTROSE 2-4 GM/100ML-% IV SOLN
2.0000 g | INTRAVENOUS | Status: AC
  Administered 2018-03-14: 2 g via INTRAVENOUS
  Filled 2018-03-14: qty 100

## 2018-03-14 MED ORDER — DEXAMETHASONE SODIUM PHOSPHATE 10 MG/ML IJ SOLN
INTRAMUSCULAR | Status: AC
  Filled 2018-03-14: qty 1

## 2018-03-14 MED ORDER — GABAPENTIN 300 MG PO CAPS
ORAL_CAPSULE | ORAL | Status: AC
  Filled 2018-03-14: qty 1

## 2018-03-14 SURGICAL SUPPLY — 42 items
CABLE HIGH FREQUENCY MONO STRZ (ELECTRODE) ×4 IMPLANT
CANISTER SUCT 3000ML PPV (MISCELLANEOUS) ×4 IMPLANT
CHLORAPREP W/TINT 26ML (MISCELLANEOUS) ×4 IMPLANT
COVER WAND RF STERILE (DRAPES) ×4 IMPLANT
DECANTER SPIKE VIAL GLASS SM (MISCELLANEOUS) ×4 IMPLANT
DEVICE SECURE STRAP 25 ABSORB (INSTRUMENTS) IMPLANT
DRAPE LAPAROSCOPIC ABDOMINAL (DRAPES) ×4 IMPLANT
DRAPE WARM FLUID 44X44 (DRAPE) ×4 IMPLANT
DRSG TEGADERM 2-3/8X2-3/4 SM (GAUZE/BANDAGES/DRESSINGS) ×4 IMPLANT
DRSG TEGADERM 4X4.75 (GAUZE/BANDAGES/DRESSINGS) ×4 IMPLANT
ELECT REM PT RETURN 9FT ADLT (ELECTROSURGICAL) ×4
ELECTRODE REM PT RTRN 9FT ADLT (ELECTROSURGICAL) ×2 IMPLANT
GLOVE ECLIPSE 8.0 STRL XLNG CF (GLOVE) ×4 IMPLANT
GLOVE INDICATOR 8.0 STRL GRN (GLOVE) ×4 IMPLANT
GOWN STRL REUS W/TWL XL LVL3 (GOWN DISPOSABLE) ×4 IMPLANT
IRRIG SUCT STRYKERFLOW 2 WTIP (MISCELLANEOUS)
IRRIGATION SUCT STRKRFLW 2 WTP (MISCELLANEOUS) IMPLANT
KIT TURNOVER CYSTO (KITS) ×4 IMPLANT
MANIFOLD NEPTUNE II (INSTRUMENTS) ×4 IMPLANT
MESH HERNIA 6X6 BARD (Mesh General) ×2 IMPLANT
MESH HERNIA BARD 6X6 (Mesh General) ×2 IMPLANT
MESH ULTRAPRO 6X6 15CM15CM (Mesh General) ×8 IMPLANT
NEEDLE HYPO 22GX1.5 SAFETY (NEEDLE) ×4 IMPLANT
NS IRRIG 500ML POUR BTL (IV SOLUTION) ×4 IMPLANT
PACK BASIN DAY SURGERY FS (CUSTOM PROCEDURE TRAY) ×4 IMPLANT
PAD POSITIONING PINK XL (MISCELLANEOUS) ×4 IMPLANT
SCISSORS LAP 5X35 DISP (ENDOMECHANICALS) ×4 IMPLANT
SLEEVE ADV FIXATION 5X100MM (TROCAR) ×4 IMPLANT
SPONGE GAUZE 2X2 8PLY STER LF (GAUZE/BANDAGES/DRESSINGS) ×1
SPONGE GAUZE 2X2 8PLY STRL LF (GAUZE/BANDAGES/DRESSINGS) ×3 IMPLANT
SUT MNCRL AB 4-0 PS2 18 (SUTURE) ×4 IMPLANT
SUT PDS AB 1 CT1 27 (SUTURE) ×8 IMPLANT
SUT VIC AB 2-0 SH 27 (SUTURE)
SUT VIC AB 2-0 SH 27XBRD (SUTURE) IMPLANT
SUT VICRYL 0 UR6 27IN ABS (SUTURE) IMPLANT
TOWEL OR 17X26 10 PK STRL BLUE (TOWEL DISPOSABLE) ×8 IMPLANT
TRAY DSU PREP LF (CUSTOM PROCEDURE TRAY) ×4 IMPLANT
TRAY LAPAROSCOPIC (CUSTOM PROCEDURE TRAY) ×8 IMPLANT
TROCAR ADV FIXATION 5X100MM (TROCAR) ×4 IMPLANT
TROCAR XCEL BLUNT TIP 100MML (ENDOMECHANICALS) ×4 IMPLANT
TUBING EVAC SMOKE HEATED PNEUM (TUBING) ×4 IMPLANT
WATER STERILE IRR 500ML POUR (IV SOLUTION) ×4 IMPLANT

## 2018-03-14 NOTE — Discharge Instructions (Signed)
HERNIA REPAIR: POST OP INSTRUCTIONS  ######################################################################  EAT Gradually transition to a high fiber diet with a fiber supplement over the next few weeks after discharge.  Start with a pureed / full liquid diet (see below)  WALK Walk an hour a day.  Control your pain to do that.    CONTROL PAIN Control pain so that you can walk, sleep, tolerate sneezing/coughing, and go up/down stairs.  HAVE A BOWEL MOVEMENT DAILY Keep your bowels regular to avoid problems.  OK to try a laxative to override constipation.  OK to use an antidairrheal to slow down diarrhea.  Call if not better after 2 tries  CALL IF YOU HAVE PROBLEMS/CONCERNS Call if you are still struggling despite following these instructions. Call if you have concerns not answered by these instructions  ######################################################################    1. DIET: Follow a light bland diet the first 24 hours after arrival home, such as soup, liquids, crackers, etc.  Be sure to include lots of fluids daily.  Advance to a low fat / high fiber diet over the next few days after surgery.  Avoid fast food or heavy meals the first week as your are more likely to get nauseated.    2. Take your usually prescribed home medications unless otherwise directed.  3. PAIN CONTROL: a. Pain is best controlled by a usual combination of three different methods TOGETHER: i. Ice/Heat ii. Over the counter pain medication iii. Prescription pain medication b. Most patients will experience some swelling and bruising around the hernia(s) such as the bellybutton, groins, or old incisions.  Ice packs or heating pads (30-60 minutes up to 6 times a day) will help. Use ice for the first few days to help decrease swelling and bruising, then switch to heat to help relax tight/sore spots and speed recovery.  Some people prefer to use ice alone, heat alone, alternating between ice & heat.  Experiment  to what works for you.  Swelling and bruising can take several weeks to resolve.   c. It is helpful to take an over-the-counter pain medication regularly for the first few weeks.  Choose one of the following that works best for you: i. Naproxen (Aleve, etc)  Two 245m tabs twice a day ii. Ibuprofen (Advil, etc) Three 2038mtabs four times a day (every meal & bedtime) iii. Acetaminophen (Tylenol, etc) 325-65079mour times a day (every meal & bedtime) d. A  prescription for pain medication should be given to you upon discharge.  Take your pain medication as prescribed.  i. If you are having problems/concerns with the prescription medicine (does not control pain, nausea, vomiting, rash, itching, etc), please call us [phone] see if we need to switch you to a different pain medicine that will work better for you and/or control your side effect better. ii. If you need a refill on your pain medication, please contact your pharmacy.  They will contact our office to request authorization. Prescriptions will not be filled after 5 pm or on week-ends.  4. Avoid getting constipated.  Between the surgery and the pain medications, it is common to experience some constipation.  Increasing fluid intake and taking a fiber supplement (such as Metamucil, Citrucel, FiberCon, MiraLax, etc) 1-2 times a day regularly will usually help prevent this problem from occurring.  A mild laxative (prune juice, Milk of Magnesia, MiraLax, etc) should be taken according to package directions if there are no bowel movements after 48 hours.    5. Wash / shower every  day.  You may shower over the dressings as they are waterproof.    6. Remove your waterproof bandages, skin tapes, and other bandages 5 days after surgery. You may replace a dressing/Band-Aid to cover the incision for comfort if you wish. You may leave the incisions open to air.  You may replace a dressing/Band-Aid to cover an incision for comfort if you wish.   Continue to shower over incision(s) after the dressing is off.  7. ACTIVITIES as tolerated:   a. You may resume regular (light) daily activities beginning the next day--such as daily self-care, walking, climbing stairs--gradually increasing activities as tolerated.  Control your pain so that you can walk an hour a day.  If you can walk 30 minutes without difficulty, it is safe to try more intense activity such as jogging, treadmill, bicycling, low-impact aerobics, swimming, etc. b. Save the most intensive and strenuous activity for last such as sit-ups, heavy lifting, contact sports, etc  Refrain from any heavy lifting or straining until you are off narcotics for pain control.   c. DO NOT PUSH THROUGH PAIN.  Let pain be your guide: If it hurts to do something, don't do it.  Pain is your body warning you to avoid that activity for another week until the pain goes down. d. You may drive when you are no longer taking prescription pain medication, you can comfortably wear a seatbelt, and you can safely maneuver your car and apply brakes. e. Dennis Bast may have sexual intercourse when it is comfortable.   8. FOLLOW UP in our office a. Please call [facility] at [phone] to set up an appointment to see your surgeon in the office for a follow-up appointment approximately 2-3 weeks after your surgery. b. Make sure that you call for this appointment the day you arrive home to insure a convenient appointment time.  9.  If you have disability of FMLA / Family leave forms, please bring the forms to the office for processing.  (do not give to your surgeon).  WHEN TO CALL us [phone]: 1. Poor pain control 2. Reactions / problems with new medications (rash/itching, nausea, etc)  3. Fever over 101.5 F (38.5 C) 4. Inability to urinate 5. Nausea and/or vomiting 6. Worsening swelling or bruising 7. Continued bleeding from incision. 8. Increased pain, redness, or drainage from the incision   The clinic staff is  available to answer your questions during regular business hours (8:30am-5pm).  Please dont hesitate to call and ask to speak to one of our nurses for clinical concerns.   If you have a medical emergency, go to the nearest emergency room or call 911.  A surgeon from [facility] is always on call at the hospitals in [facility], [place], [place], [place], [place]  [address] ?  [address] [address], [place], [place]   [address] MAIN: [phone] ? TOLL FREE: [phone] ? FAX: [phone] [email or web address]  Post Anesthesia Home Care Instructions  Activity: Get plenty of rest for the remainder of the day. A responsible individual must stay with you for 24 hours following the procedure.  For the next 24 hours, DO NOT: -Drive a car -Paediatric nurse -Drink alcoholic beverages -Take any medication unless instructed by your physician -Make any legal decisions or sign important papers.  Meals: Start with liquid foods such as gelatin or soup. Progress to regular foods as tolerated. Avoid greasy, spicy, heavy foods. If nausea and/or vomiting occur, drink only  clear liquids until the nausea and/or vomiting subsides. Call your physician if vomiting continues.  Special Instructions/Symptoms: Your throat may feel dry or sore from the anesthesia or the breathing tube placed in your throat during surgery. If this causes discomfort, gargle with warm salt water. The discomfort should disappear within 24 hours.  If you had a scopolamine patch placed behind your ear for the management of post- operative nausea and/or vomiting:  1. The medication in the patch is effective for 72 hours, after which it should be removed.  Wrap patch in a tissue and discard in the trash. Wash hands thoroughly with soap and water. 2. You may remove the patch earlier than 72 hours if you experience unpleasant side effects which may include dry mouth, dizziness or  visual disturbances. 3. Avoid touching the patch. Wash your hands with soap and water after contact with the patch.     Post Anesthesia Home Care Instructions  Activity: Get plenty of rest for the remainder of the day. A responsible individual must stay with you for 24 hours following the procedure.  For the next 24 hours, DO NOT: -Drive a car -Paediatric nurse -Drink alcoholic beverages -Take any medication unless instructed by your physician -Make any legal decisions or sign important papers.  Meals: Start with liquid foods such as gelatin or soup. Progress to regular foods as tolerated. Avoid greasy, spicy, heavy foods. If nausea and/or vomiting occur, drink only clear liquids until the nausea and/or vomiting subsides. Call your physician if vomiting continues.  Special Instructions/Symptoms: Your throat may feel dry or sore from the anesthesia or the breathing tube placed in your throat during surgery. If this causes discomfort, gargle with warm salt water. The discomfort should disappear within 24 hours.  If you had a scopolamine patch placed behind your ear for the management of post- operative nausea and/or vomiting:  1. The medication in the patch is effective for 72 hours, after which it should be removed.  Wrap patch in a tissue and discard in the trash. Wash hands thoroughly with soap and water. 2. You may remove the patch earlier than 72 hours if you experience unpleasant side effects which may include dry mouth, dizziness or visual disturbances. 3. Avoid touching the patch. Wash your hands with soap and water after contact with the patch.

## 2018-03-14 NOTE — Anesthesia Preprocedure Evaluation (Addendum)
Anesthesia Evaluation  Patient identified by MRN, date of birth, ID band Patient awake    Reviewed: Allergy & Precautions, NPO status , Patient's Chart, lab work & pertinent test results  History of Anesthesia Complications (+) PONV and history of anesthetic complications  Airway Mallampati: I  TM Distance: >3 FB Neck ROM: Full    Dental no notable dental hx. (+)    Pulmonary neg pulmonary ROS,    Pulmonary exam normal breath sounds clear to auscultation       Cardiovascular negative cardio ROS Normal cardiovascular exam Rhythm:Regular Rate:Normal     Neuro/Psych negative neurological ROS  negative psych ROS   GI/Hepatic negative GI ROS, Neg liver ROS,   Endo/Other  negative endocrine ROS  Renal/GU negative Renal ROS  negative genitourinary   Musculoskeletal  (+) Arthritis ,   Abdominal   Peds  Hematology negative hematology ROS (+)   Anesthesia Other Findings   Reproductive/Obstetrics                            Anesthesia Physical Anesthesia Plan  ASA: II  Anesthesia Plan: General   Post-op Pain Management:    Induction: Intravenous  PONV Risk Score and Plan: 3 and Midazolam, Dexamethasone and Ondansetron  Airway Management Planned: Oral ETT  Additional Equipment:   Intra-op Plan:   Post-operative Plan: Extubation in OR  Informed Consent: I have reviewed the patients History and Physical, chart, labs and discussed the procedure including the risks, benefits and alternatives for the proposed anesthesia with the patient or authorized representative who has indicated his/her understanding and acceptance.     Dental advisory given  Plan Discussed with: CRNA  Anesthesia Plan Comments:         Anesthesia Quick Evaluation

## 2018-03-14 NOTE — Transfer of Care (Signed)
  Last Vitals:  Vitals Value Taken Time  BP    Temp    Pulse 70 03/14/2018  1:13 PM  Resp 14 03/14/2018  1:13 PM  SpO2 100 % 03/14/2018  1:13 PM  Vitals shown include unvalidated device data.  Last Pain:  Vitals:   03/14/18 1035  TempSrc: Oral  PainSc:       Patients Stated Pain Goal: 7 (03/14/18 1021) Immediate Anesthesia Transfer of Care Note  Patient: Curtis [name]  Procedure(s) Performed: Procedure(s) (LRB): LAPAROSCOPIC BILATERAL  INGUINAL HERNIA REPAIR  WITH MESH ERAS PAHTWAY (Bilateral) HERNIA REPAIR UMBILICAL ADULT (N/A)  Patient Location: PACU  Anesthesia Type: General  Level of Consciousness: awake, alert  and oriented  Airway & Oxygen Therapy: Patient Spontanous Breathing and Patient connected to nasal cannula oxygen  Post-op Assessment: Report given to PACU RN and Post -op Vital signs reviewed and stable  Post vital signs: Reviewed and stable  Complications: No apparent anesthesia complications

## 2018-03-14 NOTE — Anesthesia Procedure Notes (Signed)
Procedure Name: Intubation Date/Time: 03/14/2018 11:25 AM Performed by: Freddrick March, MD Pre-anesthesia Checklist: Patient identified, Emergency Drugs available, Suction available and Patient being monitored Patient Re-evaluated:Patient Re-evaluated prior to induction Oxygen Delivery Method: Circle system utilized Preoxygenation: Pre-oxygenation with 100% oxygen Induction Type: IV induction Ventilation: Mask ventilation without difficulty Laryngoscope Size: Mac and 4 Grade View: Grade I Tube type: Oral Tube size: 7.5 mm Number of attempts: 1 Airway Equipment and Method: Stylet and Oral airway Placement Confirmation: ETT inserted through vocal cords under direct vision,  positive ETCO2 and breath sounds checked- equal and bilateral Secured at: 23 cm Tube secured with: Tape Dental Injury: Teeth and Oropharynx as per pre-operative assessment

## 2018-03-14 NOTE — Anesthesia Postprocedure Evaluation (Signed)
Anesthesia Post Note  Patient: Curtis [name]  Procedure(s) Performed: LAPAROSCOPIC BILATERAL  INGUINAL HERNIA REPAIR  WITH MESH ERAS [place] (Bilateral Abdomen) HERNIA REPAIR UMBILICAL ADULT (N/A Abdomen)     Patient location during evaluation: PACU Anesthesia Type: General Level of consciousness: awake and alert Pain management: pain level controlled Vital Signs Assessment: post-procedure vital signs reviewed and stable Respiratory status: spontaneous breathing, nonlabored ventilation, respiratory function stable and patient connected to nasal cannula oxygen Cardiovascular status: blood pressure returned to baseline and stable Postop Assessment: no apparent nausea or vomiting Anesthetic complications: no    Last Vitals:  Vitals:   03/14/18 1500 03/14/18 1638  BP: 119/76 116/80  Pulse: 68 66  Resp: 12 14  Temp:  36.6 C  SpO2: 98% 100%    Last Pain:  Vitals:   03/14/18 1638  TempSrc:   PainSc: 5                  Gage Weant L Ceira Hoeschen

## 2018-03-14 NOTE — Interval H&P Note (Signed)
History and Physical Interval Note:  03/14/2018 11:09 AM  Curtis [name]  has presented today for surgery, with the diagnosis of left inguinal hernia.  History of right inguinal hernia repair.  The various methods of treatment have been discussed with the patient and family. After consideration of risks, benefits and other options for treatment, the patient has consented to  Procedure(s): LAPAROSCOPIC LEFT POSSIBLE RIGHT  INGUINAL HERNIA REPAIR  WITH [place] (Bilateral) as a surgical intervention.  The patient's history has been reviewed, patient examined, no change in status, stable for surgery.  I have reviewed the patient's chart and labs.  Questions were answered to the patient's satisfaction.    I have re-reviewed the the patient's records, history, medications, and allergies.  I have re-examined the patient.  I again discussed intraoperative plans and goals of post-operative recovery.  The patient agrees to proceed.  Curtis [name]  07/23/1975 [id number]  Patient Care Team: [name], MD as PCP - General (Internal Medicine) Orion Crook, MD (Inactive) (Urology) Ladene Artist, MD (Gastroenterology) Harold Hedge, Darrick Grinder, MD (Allergy and Immunology)  Patient Active Problem List   Diagnosis Date Noted  . Left inguinal hernia 02/06/2018  . Colitis 03/21/2017  . Toe pain, right 10/09/2016  . Routine general medical examination at a health care facility 08/23/2015  . Hypersomnolence 12/09/2014  . Fatigue 11/12/2014  . SOB (shortness of breath) 11/12/2014  . Back muscle spasm 10/29/2014  . Skin change 06/13/2013  . Change in bowel habits 03/10/2013  . IBS (irritable bowel syndrome) 03/10/2013  . Cough 12/14/2009  . Pleurisy 02/20/2009  . INGUINAL HERNIA, RIGHT 04/24/2008  . Diarrhea 04/24/2008    Past Medical History:  Diagnosis Date  . ACNE, CYSTIC    resolved  . Arthritis    spine, early onset  . IBS (irritable bowel syndrome)   . INGUINAL  HERNIA, RIGHT 04/24/2008   s/p RIH repair  . Lactose intolerance    diet controlled  . PONV (postoperative nausea and vomiting)     Past Surgical History:  Procedure Laterality Date  . COLONOSCOPY    . HERNIA REPAIR Right 2010   Dr. Grandville Silos  . Surgery for spont pneumothorax Right 1996  . WISDOM TOOTH EXTRACTION      Social History   Socioeconomic History  . Marital status: Single    Spouse name: Not on file  . Number of children: 0  . Years of education: 66  . Highest education level: Not on file  Occupational History  . Occupation: copy room    Employer: [employer]  . Financial resource strain: Not on file  . Food insecurity:    Worry: Not on file    Inability: Not on file  . Transportation needs:    Medical: Not on file    Non-medical: Not on file  Tobacco Use  . Smoking status: Never Smoker  . Smokeless tobacco: Never Used  Substance and Sexual Activity  . Alcohol use: Yes    Alcohol/week: 3.0 standard drinks    Types: 3 Cans of beer per week    Comment: Occasinal  . Drug use: Yes    Types: Marijuana    Comment: 05-11-16  . Sexual activity: Not on file  Lifestyle  . Physical activity:    Days per week: Not on file    Minutes per session: Not on file  . Stress: Not on file  Relationships  . Social connections:  Talks on phone: Not on file    Gets together: Not on file    Attends religious service: Not on file    Active member of club or organization: Not on file    Attends meetings of clubs or organizations: Not on file    Relationship status: Not on file  . Intimate partner violence:    Fear of current or ex partner: Not on file    Emotionally abused: Not on file    Physically abused: Not on file    Forced sexual activity: Not on file  Other Topics Concern  . Not on file  Social History Narrative   Fun: Play basketball,golf, binge watching     Family History  Problem Relation Age of Onset  . Urolithiasis Father        Had kidney  removed at age 74  . Stroke Maternal Grandmother 82  . Colon cancer Neg Hx   . Esophageal cancer Neg Hx   . Pancreatic cancer Neg Hx   . Prostate cancer Neg Hx   . Rectal cancer Neg Hx   . Stomach cancer Neg Hx     Medications Prior to Admission  Medication Sig Dispense Refill Last Dose  . mesalamine (LIALDA) 1.2 g EC tablet Take 2 tablets (2.4 g total) by mouth 2 (two) times daily. 360 tablet 3 03/13/2018 at Unknown time    Current Facility-Administered Medications  Medication Dose Route Frequency Provider Last Rate Last Dose  . bupivacaine liposome (EXPAREL) 1.3 % injection 266 mg  20 mL Infiltration Once Michael Boston, MD      . ceFAZolin (ANCEF) IVPB 2g/100 mL premix  2 g Intravenous On Call to OR Michael Boston, MD      . Chlorhexidine Gluconate Cloth 2 % PADS 6 each  6 each Topical Once Michael Boston, MD       And  . Chlorhexidine Gluconate Cloth 2 % PADS 6 each  6 each Topical Once Michael Boston, MD      . lactated ringers infusion   Intravenous Continuous Belinda Block, MD 50 mL/hr at 03/14/18 0910     Facility-Administered Medications Ordered in Other Encounters  Medication Dose Route Frequency Provider Last Rate Last Dose  . scopolamine (TRANSDERM-SCOP) 1 MG/3DAYS    Anesthesia Intra-op Mechele Claude, CRNA   1 patch at 03/14/18 1052     No Known Allergies  BP 120/86   Pulse (!) 57   Temp 98.5 F (36.9 C) (Oral)   Ht 6' 1"  (1.854 m)   Wt 77.7 kg   SpO2 99%   BMI 22.60 kg/m   Labs: No results found for this or any previous visit (from the past 48 hour(s)).  Imaging / Studies: No results found.   [name], M.D., F.A.C.S. Gastrointestinal and Minimally Invasive Surgery [facility] [address] [place], [place] [address] [phone] Main / Paging  03/14/2018 11:09 AM     [name]

## 2018-03-14 NOTE — Op Note (Addendum)
03/14/2018  12:58 PM  PATIENT:  Curtis [name]  43 y.o. male  Patient Care Team: [name], MD as PCP - General (Internal Medicine) Orion Crook, MD (Inactive) (Urology) Ladene Artist, MD (Gastroenterology) Harold Hedge, Darrick Grinder, MD (Allergy and Immunology) Michael Boston, MD as Consulting Physician (General Surgery)  PRE-OPERATIVE DIAGNOSIS:   Left inguinal hernia.   History of right inguinal hernia repair, possible recurrence  POST-OPERATIVE DIAGNOSIS:   LEFT INDIRECT INGUINAL HERNIA RECURRENT RIGHT INGUINAL HERNIA UMBILICAL HERNIA   PROCEDURE:   LAPAROSCOPIC BILATERAL INGUINAL HERNIA REPAIR PRIMARY UMBILICAL HERNIA REPAIR  SURGEON:  [name], MD  ASSISTANT: OR Staff  ANESTHESIA:     Regional ilioinguinal and genitofemoral and spermatic cord nerve blocks  General  Nerve block provided with liposomal bupivacaine (Experel) mixed with 0.25% bupivacaine as a Bilateral TAP block x 94m each side at the level of the transverse abdominis & preperitoneal spaces along the flank at the anterior axillary line, from subcostal ridge to iliac crest under laparoscopic guidance    EBL:  Total I/O In: 750 [I.V.:750] Out: 30 [Blood:30].  See anesthesia record  Delay start of Pharmacological VTE agent (>24hrs) due to surgical blood loss or risk of bleeding:  no  DRAINS: NONE  SPECIMEN: Spermatic cord lipomas (not sent)   DISPOSITION OF SPECIMEN:  N/A  COUNTS:  YES  PLAN OF CARE: Discharge to home after PACU  PATIENT DISPOSITION:  PACU - hemodynamically stable.  INDICATION: Pleasant gentleman with history of large indirect right inguinal hernia requiring open repair with mesh in 2012.  Felt bulging on the left side suspicious for left inguinal hernia.  He notes some bulging on the right as well.  I recommended laparoscopic exploration and repair of hernias found  The anatomy & physiology of the abdominal wall and pelvic floor was discussed.  The  pathophysiology of hernias in the inguinal and pelvic region was discussed.  Natural history risks such as progressive enlargement, pain, incarceration & strangulation was discussed.   Contributors to complications such as smoking, obesity, diabetes, prior surgery, etc were discussed.    I feel the risks of no intervention will lead to serious problems that outweigh the operative risks; therefore, I recommended surgery to reduce and repair the hernia.  I explained laparoscopic techniques with possible need for an open approach.  I noted usual use of mesh to patch and/or buttress hernia repair  Risks such as bleeding, infection, abscess, need for further treatment, heart attack, death, and other risks were discussed.  I noted a good likelihood this will help address the problem.   Goals of post-operative recovery were discussed as well.  Possibility that this will not correct all symptoms was explained.  I stressed the importance of low-impact activity, aggressive pain control, avoiding constipation, & not pushing through pain to minimize risk of post-operative chronic pain or injury. Possibility of reherniation was discussed.  We will work to minimize complications.     An educational handout further explaining the pathology & treatment options was given as well.  Questions were answered.  The patient expresses understanding & wishes to proceed with surgery.  OR FINDINGS: Left indirect inguinal hernia with some spermatic cord lipomas with him.  No direct space femoral nor obturator hernias.  On the RIGHT side, intact indirect inguinal hernia repair but evidence of direct space hernia recurrence.  No femoral nor obturator hernia.  Small umbilical hernia within the stalk 8 mm.  Primarily repaired.  DESCRIPTION:  The patient was identified & brought into the operating room. The patient was positioned supine with arms tucked. SCDs were active during the entire case. The patient underwent general  anesthesia without any difficulty.  The abdomen was prepped and draped in a sterile fashion. The patient's bladder was emptied.  A Surgical Timeout confirmed our plan.  I made a transverse incision through the inferior umbilical fold.  I made a small transverse nick through the anterior rectus fascia contralateral to the inguinal hernia side and placed a 0-vicryl stitch through the fascia.  I placed a Hasson trocar into the preperitoneal plane.  Entry was clean.  We induced carbon dioxide insufflation. Camera inspection revealed no injury.  I used a 31m angled scope to bluntly free the peritoneum off the infraumbilical anterior abdominal wall.  I created enough of a preperitoneal pocket to place 528mports into the right & left mid-abdomen into this preperitoneal cavity.  I focused attention on the LEFT pelvis since that was the dominant hernia side.   I used blunt & focused sharp dissection to free the peritoneum off the flank and down to the pubic rim.  I freed the anteriolateral bladder wall off the anteriolateral pelvic wall, sparing midline attachments.   I located a swath of peritoneum going into a hernia fascial defect at the  internal ring consistent with  an indirect inguinal hernia..  I gradually freed the peritoneal hernia sac off safely and reduced it into the preperitoneal space.  I freed the peritoneum off the spermatic vessels & vas deferens.  I freed peritoneum off the retroperitoneum along the psoas muscle.  Spermatic cord lipoma was dissected away & removed.  I checked & assured hemostasis.     I turned attention on the opposite  RIGHT pelvis.  I did dissection in a similar, mirror-image fashion. The patient had a direct space inguinal hernia.. Marland Kitchen Spermatic cord lipoma was dissected away & removed.    As anticipated, surgical dissection was challenged by dense adhesions and poor planes resulting in the need for careful repair of the resulting peritoneal hernia sac defects.  Repair was done  with minimally invasive intracorporeal suturing using absorbable 2-0 Vicryl running suture.  I checked & assured hemostasis.  `   I chose 15x15 cm sheets of mesh for each side.  I chose a standard weight polypropylene Bard mesh on the left side since it was a moderate size indirect hernia and he had had recurrence on the right side with prior mesh repair.  Because the hernia recurrence on the right was much smaller, I chose a ultra-lightweight polypropylene mesh (Ultrapro), one for each side.  I cut a single sigmoid-shaped slit ~6cm from a corner of each mesh.  I placed the meshes into the preperitoneal space & laid them as overlapping diamonds such that at the inferior points, a 6x6 cm corner flap rested in the true anterolateral pelvis, covering the obturator & femoral foramina.   I allowed the bladder to return to the pubis, this helping tuck the corners of the mesh in the anteriolateral pelvis.  The medial corners overlapped each other across midline cephalad to the pubic rim.   Given the numerous hernias of moderate size, I placed a third 15x15cm Ultrapro mesh in the center as a vertical diamond.  The lateral wings of the mesh overlap across the direct spaces and internal rings where the dominant hernias were.  This provided good coverage and reinforcement of the hernia repairs.  Because of the  central mesh placement with good overlap, I did not place any tacks.   I held the hernia sacs cephalad & evacuated carbon dioxide.  I freed the umbilical stalk off the fascia to reveal an 8 mm hernia there.  I primarily repaired and using #1 PDS interrupted.  I closed the right paramedian fascial defect transversely with PDS suture as well.  I tacked the umbilical stalk down with 0 Vicryl suture.  I closed the skin using 4-0 monocryl stitch.  Sterile dressings were applied.   The patient was extubated & arrived in the PACU in stable condition..  I had discussed postoperative care with the patient in the holding  area.  Instructions are written in the chart.  I discussed operative findings, updated the patient's status, discussed probable steps to recovery, and gave postoperative recommendations to the patient's significant other.  Recommendations were made.  Questions were answered.  She expressed understanding & appreciation.   [name], M.D., F.A.C.S. Gastrointestinal and Minimally Invasive Surgery [facility] [address], [place], [place] [address] [phone] Main / Paging  03/14/2018 12:58 PM

## 2018-03-15 ENCOUNTER — Encounter (HOSPITAL_BASED_OUTPATIENT_CLINIC_OR_DEPARTMENT_OTHER): Payer: Self-pay | Admitting: Surgery

## 2018-08-09 ENCOUNTER — Other Ambulatory Visit (INDEPENDENT_AMBULATORY_CARE_PROVIDER_SITE_OTHER): Payer: BC Managed Care – PPO

## 2018-08-09 ENCOUNTER — Encounter: Payer: Self-pay | Admitting: Internal Medicine

## 2018-08-09 ENCOUNTER — Other Ambulatory Visit: Payer: Self-pay

## 2018-08-09 ENCOUNTER — Ambulatory Visit (INDEPENDENT_AMBULATORY_CARE_PROVIDER_SITE_OTHER): Payer: BC Managed Care – PPO | Admitting: Internal Medicine

## 2018-08-09 VITALS — BP 134/88 | HR 70 | Temp 97.8°F | Ht 73.0 in | Wt 172.0 lb

## 2018-08-09 DIAGNOSIS — R300 Dysuria: Secondary | ICD-10-CM

## 2018-08-09 DIAGNOSIS — Z0001 Encounter for general adult medical examination with abnormal findings: Secondary | ICD-10-CM

## 2018-08-09 DIAGNOSIS — R945 Abnormal results of liver function studies: Secondary | ICD-10-CM

## 2018-08-09 DIAGNOSIS — R7989 Other specified abnormal findings of blood chemistry: Secondary | ICD-10-CM

## 2018-08-09 DIAGNOSIS — Z23 Encounter for immunization: Secondary | ICD-10-CM | POA: Diagnosis not present

## 2018-08-09 DIAGNOSIS — F419 Anxiety disorder, unspecified: Secondary | ICD-10-CM | POA: Diagnosis not present

## 2018-08-09 DIAGNOSIS — Z20828 Contact with and (suspected) exposure to other viral communicable diseases: Secondary | ICD-10-CM | POA: Diagnosis not present

## 2018-08-09 DIAGNOSIS — R739 Hyperglycemia, unspecified: Secondary | ICD-10-CM | POA: Diagnosis not present

## 2018-08-09 DIAGNOSIS — E611 Iron deficiency: Secondary | ICD-10-CM | POA: Diagnosis not present

## 2018-08-09 DIAGNOSIS — Z125 Encounter for screening for malignant neoplasm of prostate: Secondary | ICD-10-CM | POA: Diagnosis not present

## 2018-08-09 DIAGNOSIS — E559 Vitamin D deficiency, unspecified: Secondary | ICD-10-CM

## 2018-08-09 DIAGNOSIS — E538 Deficiency of other specified B group vitamins: Secondary | ICD-10-CM

## 2018-08-09 DIAGNOSIS — Z1159 Encounter for screening for other viral diseases: Secondary | ICD-10-CM | POA: Diagnosis not present

## 2018-08-09 LAB — CBC WITH DIFFERENTIAL/PLATELET
Basophils Absolute: 0 10*3/uL (ref 0.0–0.1)
Basophils Relative: 0.3 % (ref 0.0–3.0)
Eosinophils Absolute: 0.1 10*3/uL (ref 0.0–0.7)
Eosinophils Relative: 1.3 % (ref 0.0–5.0)
HCT: 46.2 % (ref 39.0–52.0)
Hemoglobin: 15.6 g/dL (ref 13.0–17.0)
Lymphocytes Relative: 28.8 % (ref 12.0–46.0)
Lymphs Abs: 2.1 10*3/uL (ref 0.7–4.0)
MCHC: 33.8 g/dL (ref 30.0–36.0)
MCV: 88.8 fl (ref 78.0–100.0)
Monocytes Absolute: 0.7 10*3/uL (ref 0.1–1.0)
Monocytes Relative: 9.6 % (ref 3.0–12.0)
Neutro Abs: 4.4 10*3/uL (ref 1.4–7.7)
Neutrophils Relative %: 60 % (ref 43.0–77.0)
Platelets: 243 10*3/uL (ref 150.0–400.0)
RBC: 5.2 Mil/uL (ref 4.22–5.81)
RDW: 13.5 % (ref 11.5–15.5)
WBC: 7.3 10*3/uL (ref 4.0–10.5)

## 2018-08-09 LAB — HEMOGLOBIN A1C: Hgb A1c MFr Bld: 5.6 % (ref 4.6–6.5)

## 2018-08-09 LAB — IBC PANEL
Iron: 43 ug/dL (ref 42–165)
Saturation Ratios: 11.8 % — ABNORMAL LOW (ref 20.0–50.0)
Transferrin: 260 mg/dL (ref 212.0–360.0)

## 2018-08-09 LAB — LIPID PANEL
Cholesterol: 204 mg/dL — ABNORMAL HIGH (ref 0–200)
HDL: 28.5 mg/dL — ABNORMAL LOW (ref >39.00)
NonHDL: 175.11
Total CHOL/HDL Ratio: 7
Triglycerides: 237 mg/dL — ABNORMAL HIGH (ref 0.0–149.0)
VLDL: 47.4 mg/dL — ABNORMAL HIGH (ref 0.0–40.0)

## 2018-08-09 LAB — BASIC METABOLIC PANEL
BUN: 16 mg/dL (ref 6–23)
CO2: 30 mEq/L (ref 19–32)
Calcium: 9.6 mg/dL (ref 8.4–10.5)
Chloride: 101 mEq/L (ref 96–112)
Creatinine, Ser: 1.03 mg/dL (ref 0.40–1.50)
GFR: 78.4 mL/min (ref >60.00)
Glucose, Bld: 95 mg/dL (ref 70–99)
Potassium: 4.1 mEq/L (ref 3.5–5.1)
Sodium: 139 mEq/L (ref 135–145)

## 2018-08-09 LAB — HEPATIC FUNCTION PANEL
ALT: 34 U/L (ref 0–53)
AST: 19 U/L (ref 0–37)
Albumin: 4.9 g/dL (ref 3.5–5.2)
Alkaline Phosphatase: 73 U/L (ref 39–117)
Bilirubin, Direct: 0.1 mg/dL (ref 0.0–0.3)
Total Bilirubin: 0.4 mg/dL (ref 0.2–1.2)
Total Protein: 7.4 g/dL (ref 6.0–8.3)

## 2018-08-09 LAB — TSH: TSH: 1.41 u[IU]/mL (ref 0.35–4.50)

## 2018-08-09 LAB — LDL CHOLESTEROL, DIRECT: Direct LDL: 145 mg/dL

## 2018-08-09 LAB — VITAMIN B12: Vitamin B-12: 173 pg/mL — ABNORMAL LOW (ref 211–911)

## 2018-08-09 LAB — PSA: PSA: 1.07 ng/mL (ref 0.10–4.00)

## 2018-08-09 LAB — VITAMIN D 25 HYDROXY (VIT D DEFICIENCY, FRACTURES): VITD: 25.03 ng/mL — ABNORMAL LOW (ref 30.00–100.00)

## 2018-08-09 NOTE — Progress Notes (Signed)
Subjective:    Patient ID: Curtis [name], male    DOB: 1975/04/16, 43 y.o.   MRN: [id number]  HPI  Here for wellness and f/u;  Overall doing ok;  Pt denies Chest pain, worsening SOB, DOE, wheezing, orthopnea, PND, worsening LE edema, palpitations, dizziness or syncope.  Pt denies neurological change such as new headache, facial or extremity weakness.  Pt states overall good compliance with treatment and medications, good tolerability, and has been trying to follow appropriate diet. No fever, night sweats, wt loss, loss of appetite, or other constitutional symptoms.  Pt states good ability with ADL's, has low fall risk, home safety reviewed and adequate, no other significant changes in hearing or vision, and only occasionally active with exercise.  Due for colonoscopy every 8 yrs.       Also s/p LIH endoscopic surgury recently, has some vague pelvic area discomfort and mild dysuria at times and concerned he may have some serious complication.  Denies urinary symptoms such as frequency, urgency, flank pain, hematuria or n/v, fever, chills.  Denies worsening reflux, abd pain, dysphagia, n/v, bowel change or blood.  Pt denies polydipsia, polyuria, or low sugar symptoms such as weakness or confusion improved with po intake.  Pt states overall good compliance with meds, trying to follow lower cholesterol, diabetic diet, wt overall stable but little exercise however.   Denies worsening depressive symptoms, suicidal ideation, or panic; has ongoing anxiety. Past Medical History:  Diagnosis Date  . ACNE, CYSTIC    resolved  . Arthritis    spine, early onset  . IBS (irritable bowel syndrome)   . INGUINAL HERNIA, RIGHT 04/24/2008   s/p RIH repair  . Lactose intolerance    diet controlled  . PONV (postoperative nausea and vomiting)    Past Surgical History:  Procedure Laterality Date  . COLONOSCOPY    . HERNIA REPAIR Right 2010   Dr. Grandville Silos  . INGUINAL HERNIA REPAIR Bilateral 03/14/2018   Procedure: LAPAROSCOPIC BILATERAL  INGUINAL HERNIA REPAIR  WITH MESH ERAS [place];  Surgeon: Michael Boston, MD;  Location: [facility];  Service: General;  Laterality: Bilateral;  . Surgery for spont pneumothorax Right 1996  . UMBILICAL HERNIA REPAIR N/A 03/14/2018   Procedure: HERNIA REPAIR UMBILICAL ADULT;  Surgeon: Michael Boston, MD;  Location: [facility];  Service: General;  Laterality: N/A;  . WISDOM TOOTH EXTRACTION      reports that he has never smoked. He has never used smokeless tobacco. He reports current alcohol use of about 3.0 standard drinks of alcohol per week. He reports current drug use. Drug: Marijuana. family history includes Stroke (age of onset: 28) in his maternal grandmother; Urolithiasis in his father. No Known Allergies Current Outpatient Medications on File Prior to Visit  Medication Sig Dispense Refill  . mesalamine (LIALDA) 1.2 g EC tablet Take 2 tablets (2.4 g total) by mouth 2 (two) times daily. 360 tablet 3  . naproxen (NAPROSYN) 500 MG tablet Take 1 tablet (500 mg total) by mouth every 12 (twelve) hours as needed for mild pain or moderate pain. 30 tablet 1  . traMADol (ULTRAM) 50 MG tablet Take 1-2 tablets (50-100 mg total) by mouth every 6 (six) hours as needed for moderate pain or severe pain. 20 tablet 0   No current facility-administered medications on file prior to visit.    Review of Systems Constitutional: Negative for other unusual diaphoresis, sweats, appetite or weight changes HENT: Negative for other worsening hearing loss, ear  pain, facial swelling, mouth sores or neck stiffness.   Eyes: Negative for other worsening pain, redness or other visual disturbance.  Respiratory: Negative for other stridor or swelling Cardiovascular: Negative for other palpitations or other chest pain  Gastrointestinal: Negative for worsening diarrhea or loose stools, blood in stool, distention or other pain Genitourinary: Negative for  hematuria, flank pain or other change in urine volume.  Musculoskeletal: Negative for myalgias or other joint swelling.  Skin: Negative for other color change, or other wound or worsening drainage.  Neurological: Negative for other syncope or numbness. Hematological: Negative for other adenopathy or swelling Psychiatric/Behavioral: Negative for hallucinations, other worsening agitation, SI, self-injury, or new decreased concentration All other system neg per pt    Objective:   Physical Exam BP 134/88   Pulse 70   Temp 97.8 F (36.6 C) (Oral)   Ht 6' 1"  (1.854 m)   Wt 172 lb (78 kg)   SpO2 98%   BMI 22.69 kg/m  VS noted,  Constitutional: Pt is oriented to person, place, and time. Appears well-developed and well-nourished, in no significant distress and comfortable Head: Normocephalic and atraumatic  Eyes: Conjunctivae and EOM are normal. Pupils are equal, round, and reactive to light Right Ear: External ear normal without discharge Left Ear: External ear normal without discharge Nose: Nose without discharge or deformity Mouth/Throat: Oropharynx is without other ulcerations and moist  Neck: Normal range of motion. Neck supple. No JVD present. No tracheal deviation present or significant neck LA or mass Cardiovascular: Normal rate, regular rhythm, normal heart sounds and intact distal pulses.   Pulmonary/Chest: WOB normal and breath sounds without rales or wheezing  Abdominal: Soft. Bowel sounds are normal. NT. No HSM  GU: normal male genitalia, scar s/p remote RIH intact, no recurrent inguinal swelling or enlarged LA, no penile d/c Musculoskeletal: Normal range of motion. Exhibits no edema Lymphadenopathy: Has no other cervical adenopathy.  Neurological: Pt is alert and oriented to person, place, and time. Pt has normal reflexes. No cranial nerve deficit. Motor grossly intact, Gait intact Skin: Skin is warm and dry. No rash noted or new ulcerations Psychiatric:  Has normal mood and  affect. Behavior is normal without agitation No other exam findings Lab Results  Component Value Date   WBC 7.4 03/21/2017   HGB 15.2 03/21/2017   HCT 44.1 03/21/2017   PLT 212.0 03/21/2017   GLUCOSE 97 03/21/2017   CHOL 211 (H) 03/21/2017   TRIG (H) 03/21/2017    555.0 Triglyceride is over 400; calculations on Lipids are invalid.   HDL 29.30 (L) 03/21/2017   LDLDIRECT 112.0 03/21/2017   LDLCALC 109 (H) 08/23/2015   ALT 62 (H) 03/21/2017   AST 32 03/21/2017   NA 138 03/21/2017   K 4.1 03/21/2017   CL 100 03/21/2017   CREATININE 1.12 03/21/2017   BUN 16 03/21/2017   CO2 26 03/21/2017   TSH 1.33 03/21/2017   PSA 1.64 03/21/2017   HGBA1C 5.5 11/12/2014        Assessment & Plan:

## 2018-08-09 NOTE — Assessment & Plan Note (Signed)
Situational it seems, reassured, declines other tx or referral

## 2018-08-09 NOTE — Assessment & Plan Note (Signed)

## 2018-08-09 NOTE — Assessment & Plan Note (Signed)
Mild, asympt, for a1c with labs 

## 2018-08-09 NOTE — Patient Instructions (Addendum)

## 2018-08-09 NOTE — Assessment & Plan Note (Signed)
Minor, for Hep C r/o,  to f/u any worsening symptoms or concerns

## 2018-08-09 NOTE — Assessment & Plan Note (Addendum)
Etiology unclear, for urine culture with labs  In addition to the time spent performing CPE, I spent an additional 25 minutes face to face,in which greater than 50% of this time was spent in counseling and coordination of care for patient's acute illness as documented, including the differential dx, treatment, further evaluation and other management of dysuria, abnormal lft, hyperglycemia, anxiety

## 2018-08-10 LAB — URINE CULTURE
MICRO NUMBER:: 748669
Result:: NO GROWTH
SPECIMEN QUALITY:: ADEQUATE

## 2018-08-11 ENCOUNTER — Encounter: Payer: Self-pay | Admitting: Internal Medicine

## 2018-08-12 ENCOUNTER — Telehealth: Payer: Self-pay | Admitting: Internal Medicine

## 2018-08-12 ENCOUNTER — Other Ambulatory Visit: Payer: Self-pay | Admitting: Internal Medicine

## 2018-08-12 LAB — HEPATITIS C ANTIBODY
Hepatitis C Ab: NONREACTIVE
SIGNAL TO CUT-OFF: 0.01 (ref <1.00)

## 2018-08-12 MED ORDER — VITAMIN D (ERGOCALCIFEROL) 1.25 MG (50000 UNIT) PO CAPS: 50000.0000 [IU] | ORAL_CAPSULE | ORAL | 0 refills | Status: DC

## 2018-08-12 NOTE — Telephone Encounter (Signed)
Most likely nutritional deficiency, though cant completely r/o an absorption abnormality  OK to take OTC b12 if he chooses at 1 per day

## 2018-08-12 NOTE — Telephone Encounter (Signed)
Patient called inquiring on lab results.  I have given him Dr. Judi Cong response.   He wants to know what could be causing his b12 to be low.  Wants to hold off on scheduling until he hears back in regard.

## 2018-08-13 NOTE — Telephone Encounter (Signed)
Pt has been informed and expressed understanding.  

## 2018-08-14 ENCOUNTER — Ambulatory Visit (INDEPENDENT_AMBULATORY_CARE_PROVIDER_SITE_OTHER): Payer: BC Managed Care – PPO

## 2018-08-14 ENCOUNTER — Other Ambulatory Visit: Payer: Self-pay

## 2018-08-14 DIAGNOSIS — E538 Deficiency of other specified B group vitamins: Secondary | ICD-10-CM | POA: Diagnosis not present

## 2018-08-14 MED ORDER — CYANOCOBALAMIN 1000 MCG/ML IJ SOLN
1000.0000 ug | Freq: Once | INTRAMUSCULAR | Status: AC
  Administered 2018-08-14: 16:00:00 1000 ug via INTRAMUSCULAR

## 2018-08-14 NOTE — Progress Notes (Signed)
Medical screening examination/treatment/procedure(s) were performed by non-physician practitioner and as supervising physician I was immediately available for consultation/collaboration. I agree with above. James John, MD   

## 2018-09-11 ENCOUNTER — Other Ambulatory Visit: Payer: Self-pay

## 2018-09-11 ENCOUNTER — Ambulatory Visit (INDEPENDENT_AMBULATORY_CARE_PROVIDER_SITE_OTHER): Payer: BC Managed Care – PPO

## 2018-09-11 DIAGNOSIS — E538 Deficiency of other specified B group vitamins: Secondary | ICD-10-CM

## 2018-09-11 MED ORDER — CYANOCOBALAMIN 1000 MCG/ML IJ SOLN
1000.0000 ug | Freq: Once | INTRAMUSCULAR | Status: AC
  Administered 2018-09-11: 1000 ug via INTRAMUSCULAR

## 2018-09-11 NOTE — Progress Notes (Signed)
Medical screening examination/treatment/procedure(s) were performed by non-physician practitioner and as supervising physician I was immediately available for consultation/collaboration. I agree with above. Harbor Vanover, MD   

## 2018-11-08 ENCOUNTER — Other Ambulatory Visit: Payer: Self-pay

## 2018-11-08 MED ORDER — MESALAMINE 1.2 G PO TBEC: 2.4000 g | DELAYED_RELEASE_TABLET | Freq: Two times a day (BID) | ORAL | 0 refills | Status: DC

## 2019-01-29 ENCOUNTER — Other Ambulatory Visit: Payer: Self-pay | Admitting: Gastroenterology

## 2019-02-18 ENCOUNTER — Other Ambulatory Visit: Payer: Self-pay | Admitting: Gastroenterology

## 2019-02-18 ENCOUNTER — Telehealth: Payer: Self-pay | Admitting: Gastroenterology

## 2019-02-18 MED ORDER — MESALAMINE 1.2 G PO TBEC: 2.4000 g | DELAYED_RELEASE_TABLET | Freq: Two times a day (BID) | ORAL | 1 refills | Status: DC

## 2019-02-18 NOTE — Telephone Encounter (Signed)
Prescription for Curtis [name] has been sent to the pharmacy until scheduled appt in March.

## 2019-03-25 ENCOUNTER — Ambulatory Visit: Payer: BC Managed Care – PPO | Admitting: Gastroenterology

## 2019-04-02 ENCOUNTER — Emergency Department (HOSPITAL_COMMUNITY): Payer: BC Managed Care – PPO

## 2019-04-02 ENCOUNTER — Encounter (HOSPITAL_COMMUNITY): Payer: Self-pay | Admitting: Radiology

## 2019-04-02 ENCOUNTER — Emergency Department (HOSPITAL_COMMUNITY)
Admission: EM | Admit: 2019-04-02 | Discharge: 2019-04-02 | Disposition: A | Discharge status: Home / Self Care | Payer: BC Managed Care – PPO | Attending: Emergency Medicine | Admitting: Emergency Medicine

## 2019-04-02 ENCOUNTER — Other Ambulatory Visit: Payer: Self-pay

## 2019-04-02 DIAGNOSIS — R197 Diarrhea, unspecified: Secondary | ICD-10-CM | POA: Diagnosis not present

## 2019-04-02 DIAGNOSIS — Z79899 Other long term (current) drug therapy: Secondary | ICD-10-CM | POA: Insufficient documentation

## 2019-04-02 DIAGNOSIS — R1030 Lower abdominal pain, unspecified: Secondary | ICD-10-CM | POA: Diagnosis not present

## 2019-04-02 DIAGNOSIS — N411 Chronic prostatitis: Secondary | ICD-10-CM | POA: Diagnosis not present

## 2019-04-02 LAB — COMPREHENSIVE METABOLIC PANEL
ALT: 43 U/L (ref 0–44)
AST: 25 U/L (ref 15–41)
Albumin: 4.4 g/dL (ref 3.5–5.0)
Alkaline Phosphatase: 61 U/L (ref 38–126)
Anion gap: 8 (ref 5–15)
BUN: 18 mg/dL (ref 6–20)
CO2: 29 mmol/L (ref 22–32)
Calcium: 9.4 mg/dL (ref 8.9–10.3)
Chloride: 101 mmol/L (ref 98–111)
Creatinine, Ser: 0.95 mg/dL (ref 0.61–1.24)
GFR calc Af Amer: >60 mL/min (ref >60)
GFR calc non Af Amer: >60 mL/min (ref >60)
Glucose, Bld: 122 mg/dL — ABNORMAL HIGH (ref 70–99)
Potassium: 3.7 mmol/L (ref 3.5–5.1)
Sodium: 138 mmol/L (ref 135–145)
Total Bilirubin: 1.1 mg/dL (ref 0.3–1.2)
Total Protein: 7.2 g/dL (ref 6.5–8.1)

## 2019-04-02 LAB — CBC
HCT: 45.3 % (ref 39.0–52.0)
Hemoglobin: 15.3 g/dL (ref 13.0–17.0)
MCH: 30.5 pg (ref 26.0–34.0)
MCHC: 33.8 g/dL (ref 30.0–36.0)
MCV: 90.2 fL (ref 80.0–100.0)
Platelets: 254 10*3/uL (ref 150–400)
RBC: 5.02 MIL/uL (ref 4.22–5.81)
RDW: 12.7 % (ref 11.5–15.5)
WBC: 8.6 10*3/uL (ref 4.0–10.5)
nRBC: 0 % (ref 0.0–0.2)

## 2019-04-02 LAB — URINALYSIS, ROUTINE W REFLEX MICROSCOPIC
Bilirubin Urine: NEGATIVE
Glucose, UA: NEGATIVE mg/dL
Ketones, ur: NEGATIVE mg/dL
Leukocytes,Ua: NEGATIVE
Nitrite: NEGATIVE
Protein, ur: NEGATIVE mg/dL
Specific Gravity, Urine: 1.018 (ref 1.005–1.030)
pH: 6 (ref 5.0–8.0)

## 2019-04-02 LAB — POC OCCULT BLOOD, ED: Fecal Occult Bld: NEGATIVE

## 2019-04-02 MED ORDER — ONDANSETRON 8 MG PO TBDP: 8.0000 mg | ORAL_TABLET | Freq: Three times a day (TID) | ORAL | 0 refills | Status: DC | PRN

## 2019-04-02 MED ORDER — SODIUM CHLORIDE (PF) 0.9 % IJ SOLN
INTRAMUSCULAR | Status: AC
  Filled 2019-04-02: qty 50

## 2019-04-02 MED ORDER — LEVOFLOXACIN 500 MG PO TABS: 500.0000 mg | ORAL_TABLET | Freq: Every day | ORAL | 0 refills | Status: DC

## 2019-04-02 MED ORDER — IOHEXOL 300 MG/ML  SOLN
100.0000 mL | Freq: Once | INTRAMUSCULAR | Status: AC | PRN
  Administered 2019-04-02: 100 mL via INTRAVENOUS

## 2019-04-02 NOTE — Discharge Instructions (Signed)
Please take all antibiotics as prescribed Your prostate is enlarged and may be infected. Please call the urology team for follow up. Return to the ED if any fever, worsening pain, or inability to keep fluids down.

## 2019-04-02 NOTE — ED Provider Notes (Signed)
[facility] DEPT Provider Note   CSN: [id number] Arrival date & time: 04/02/19  2637     History Chief Complaint  Patient presents with  . Testicle Pain  . Abdominal Pain    Curtis [name] is a 44 y.o. male.  HPI   [age]-year-old male history of inguinal hernia repair, colitis, presents today complaining of 2-week history of abdominal pain and cramping.,  He reports that during these episodes his testicles retract and are somewhat bluish bilaterally.  And he feels like he needs to urinate and have a bowel movement.  Reports urine is clear and normal urination for him.  However still is sometimes very loose.  He denies any history of STI, urethral discharge, and reports 1 sexual partner for the past 5 years.  However intercourse makes the pain worse.  He has not noted fever but does feel like he gets diaphoretic when the pain gets severe.  He denies any pain with urination.  He does not have a history of prostatitis in the past.  He has been eating and drinking as usual but does not seem to make this more or less painful.  States pain currently mainly at night waking him and having need to go to the bathroom however he does have some in the morning.  Pain has been 8 out of 10 at worst.  He is currently pain-free.     Past Medical History:  Diagnosis Date  . ACNE, CYSTIC    resolved  . Arthritis    spine, early onset  . IBS (irritable bowel syndrome)   . INGUINAL HERNIA, RIGHT 04/24/2008   s/p RIH repair  . Lactose intolerance    diet controlled  . PONV (postoperative nausea and vomiting)     Patient Active Problem List   Diagnosis Date Noted  . Dysuria 08/09/2018  . Abnormal LFTs 08/09/2018  . Anxiety 08/09/2018  . Hyperglycemia 08/09/2018  . Recurrent right inguinal hernia s/p laparoscopic repair with mesh 03/14/2018 03/14/2018  . Umbilical hernia s/p primary repair 03/14/2018 03/14/2018  . Left inguinal hernia s/p laparoscopic repair with  mesh 03/14/2018 02/06/2018  . Colitis 03/21/2017  . Toe pain, right 10/09/2016  . Encounter for well adult exam with abnormal findings 08/23/2015  . Hypersomnolence 12/09/2014  . Fatigue 11/12/2014  . SOB (shortness of breath) 11/12/2014  . Back muscle spasm 10/29/2014  . Skin change 06/13/2013  . Change in bowel habits 03/10/2013  . IBS (irritable bowel syndrome) 03/10/2013  . Cough 12/14/2009  . Pleurisy 02/20/2009  . INGUINAL HERNIA, RIGHT 04/24/2008  . Diarrhea 04/24/2008    Past Surgical History:  Procedure Laterality Date  . COLONOSCOPY    . HERNIA REPAIR Right 2010   Dr. Grandville Silos  . INGUINAL HERNIA REPAIR Bilateral 03/14/2018   Procedure: LAPAROSCOPIC BILATERAL  INGUINAL HERNIA REPAIR  WITH MESH ERAS Tucker;  Surgeon: Michael Boston, MD;  Location: [facility];  Service: General;  Laterality: Bilateral;  . Surgery for spont pneumothorax Right 1996  . UMBILICAL HERNIA REPAIR N/A 03/14/2018   Procedure: HERNIA REPAIR UMBILICAL ADULT;  Surgeon: Michael Boston, MD;  Location: [facility];  Service: General;  Laterality: N/A;  . WISDOM TOOTH EXTRACTION         Family History  Problem Relation Age of Onset  . Urolithiasis Father        Had kidney removed at age 58  . Stroke Maternal Grandmother 61  . Colon cancer Neg Hx   .  Esophageal cancer Neg Hx   . Pancreatic cancer Neg Hx   . Prostate cancer Neg Hx   . Rectal cancer Neg Hx   . Stomach cancer Neg Hx     Social History   Tobacco Use  . Smoking status: Never Smoker  . Smokeless tobacco: Never Used  Substance Use Topics  . Alcohol use: Yes    Alcohol/week: 3.0 standard drinks    Types: 3 Cans of beer per week    Comment: Occasinal  . Drug use: Yes    Types: Marijuana    Comment: 05-11-16    Home Medications Prior to Admission medications   Medication Sig Start Date End Date Taking? Authorizing Provider  mesalamine (LIALDA) 1.2 g EC tablet TAKE 2 TABLETS (2.4 G TOTAL) BY MOUTH  2 (TWO) TIMES DAILY. *NEEDS OFFICE VISIT* 02/18/19   Ladene Artist, MD  naproxen (NAPROSYN) 500 MG tablet Take 1 tablet (500 mg total) by mouth every 12 (twelve) hours as needed for mild pain or moderate pain. 03/14/18   Michael Boston, MD  traMADol (ULTRAM) 50 MG tablet Take 1-2 tablets (50-100 mg total) by mouth every 6 (six) hours as needed for moderate pain or severe pain. 03/14/18   Michael Boston, MD  Vitamin D, Ergocalciferol, (DRISDOL) 1.25 MG (50000 UT) CAPS capsule Take 1 capsule (50,000 Units total) by mouth every 7 (seven) days. 08/12/18   [name], MD    Allergies    Patient has no known allergies.  Review of Systems   Review of Systems  All other systems reviewed and are negative.   Physical Exam Updated Vital Signs Ht 1.854 m (6' 1" )   Wt 77.1 kg   BMI 22.43 kg/m   Physical Exam Vitals and nursing note reviewed.  Constitutional:      General: He is not in acute distress.    Appearance: He is well-developed and normal weight.  HENT:     Head: Normocephalic and atraumatic.     Right Ear: External ear normal.     Left Ear: External ear normal.     Nose: Nose normal.  Eyes:     Conjunctiva/sclera: Conjunctivae normal.     Pupils: Pupils are equal, round, and reactive to light.  Cardiovascular:     Rate and Rhythm: Normal rate and regular rhythm.     Heart sounds: Normal heart sounds.  Pulmonary:     Effort: Pulmonary effort is normal. No respiratory distress.     Breath sounds: Normal breath sounds. No wheezing.  Chest:     Chest wall: No tenderness.  Abdominal:     General: Bowel sounds are normal. There is no distension.     Palpations: Abdomen is soft. There is no mass.     Tenderness: There is abdominal tenderness in the suprapubic area. There is no right CVA tenderness, left CVA tenderness, guarding or rebound. Negative signs include psoas sign and obturator sign.  Genitourinary:    Penis: Normal.      Testes: Normal.     Prostate: Enlarged and  tender.     Rectum: No tenderness.  Musculoskeletal:        General: Normal range of motion.     Cervical back: Normal range of motion and neck supple.  Skin:    General: Skin is warm and dry.     Capillary Refill: Capillary refill takes less than 2 seconds.  Neurological:     Mental Status: He is alert and oriented to person, place,  and time.     Motor: No abnormal muscle tone.     Coordination: Coordination normal.     Deep Tendon Reflexes: Reflexes are normal and symmetric.  Psychiatric:        Behavior: Behavior normal.        Thought Content: Thought content normal.        Judgment: Judgment normal.     ED Results / Procedures / Treatments   Labs (all labs ordered are listed, but only abnormal results are displayed) Labs Reviewed  COMPREHENSIVE METABOLIC PANEL - Abnormal; Notable for the following components:      Result Value   Glucose, Bld 122 (*)    All other components within normal limits  URINALYSIS, ROUTINE W REFLEX MICROSCOPIC - Abnormal; Notable for the following components:   Hgb urine dipstick MODERATE (*)    Bacteria, UA RARE (*)    All other components within normal limits  CBC  POC OCCULT BLOOD, ED    EKG None  Radiology CT ABDOMEN PELVIS W CONTRAST  Result Date: 04/02/2019 CLINICAL DATA:  Lower abdominal and pelvic pain for 2 weeks, question prostatitis, associated nausea, diarrhea and constipation, history of BILATERAL inguinal hernia repairs and colitis EXAM: CT ABDOMEN AND PELVIS WITH CONTRAST TECHNIQUE: Multidetector CT imaging of the abdomen and pelvis was performed using the standard protocol following bolus administration of intravenous contrast. Sagittal and coronal MPR images reconstructed from axial data set. CONTRAST:  1103m OMNIPAQUE IOHEXOL 300 MG/ML SOLN IV. No oral contrast. COMPARISON:  03/11/2013 FINDINGS: Lower chest: Lung bases clear Hepatobiliary: Gallbladder and liver normal appearance Pancreas: Normal appearance Spleen: Normal  appearance Adrenals/Urinary Tract: Adrenal glands, kidneys, ureters, and bladder normal appearance. Stomach/Bowel: Medication tablets within stomach. Normal appendix. Stomach and bowel loops otherwise normal appearance Vascular/Lymphatic: Aorta normal caliber. Vascular structures patent. No adenopathy. Reproductive: Minimal enlargement of prostate gland, measures 4.6 x 3.7 cm. Seminal vesicles unremarkable Other: No free air or free fluid. Postsurgical changes from prior inguinal herniorrhaphy bilaterally. No recurrent hernia or inflammatory process. Musculoskeletal: Unremarkable IMPRESSION: No acute intra-abdominal or intrapelvic abnormalities. Minimal enlargement of prostate gland. Electronically Signed   By: MLavonia DanaM.D.   On: 04/02/2019 11:48    Procedures Procedures (including critical care time)  Medications Ordered in ED Medications - No data to display  ED Course  I have reviewed the triage vital signs and the nursing notes.  Pertinent labs & imaging results that were available during my care of the patient were reviewed by me and considered in my medical decision making (see chart for details).  Clinical Course as of Apr 02 1150  Wed Apr 02, 2019  1010 Labs reviewed personally by me. Awaiting ua and ct   [DR]    Clinical Course User Index [DR] RPattricia Boss MD   MDM Rules/Calculators/A&P                      44year old male presents today with crampy lower abdominal pain.  He has associated increased frequency of urination and some pain with urination.  Urinalysis has moderate hemoglobin with 0-5 red blood cells and 0-5 white blood cells with rare bacteria.  Rectal exam is tender over what appears to be an enlarged prostate.  CBC and c-Met normal with exception of slightly elevated glucose at 122. CT obtained showing of acute intra-abdominal pathology but some enlargement of the prostate.  Plan treatment for probable prostatitis and urine to be cultured.  Patient advised of  return precautions  and need for follow-up and voices understanding. Final Clinical Impression(s) / ED Diagnoses Final diagnoses:  Lower abdominal pain  Chronic prostatitis    Rx / DC Orders ED Discharge Orders    None       Pattricia Boss, MD 04/02/19 1156

## 2019-04-03 DIAGNOSIS — R102 Pelvic and perineal pain: Secondary | ICD-10-CM | POA: Diagnosis not present

## 2019-04-03 DIAGNOSIS — N411 Chronic prostatitis: Secondary | ICD-10-CM | POA: Diagnosis not present

## 2019-04-03 DIAGNOSIS — R351 Nocturia: Secondary | ICD-10-CM | POA: Diagnosis not present

## 2019-04-03 LAB — URINE CULTURE: Culture: NO GROWTH

## 2019-04-17 ENCOUNTER — Telehealth: Payer: Self-pay | Admitting: Internal Medicine

## 2019-04-17 NOTE — Telephone Encounter (Signed)
These symptoms do not sound at all related to his antibiotic use  OK to continue the antibiotic as recommended  OK for OTC allegra 180 mg per day and OTC Nasacort twice per day for allergies

## 2019-04-17 NOTE — Telephone Encounter (Signed)
° °  Patient experiencing  nausea, productive cough (brown phlegm),post nasal drip,allergy like symptoms.  Patient states he feels this is a result of the antibiotic he was taking for chronic prostatitis Patient declined to schedule a virtual visit at this time, stating " he doesn't have Covid" seeking advice.

## 2019-04-17 NOTE — Telephone Encounter (Signed)
Dr. Jenny Reichmann is it safe for this patient to come in for a office visit or should he have a virtual visit?   Please advise

## 2019-04-18 DIAGNOSIS — K219 Gastro-esophageal reflux disease without esophagitis: Secondary | ICD-10-CM | POA: Diagnosis not present

## 2019-04-18 DIAGNOSIS — Z20822 Contact with and (suspected) exposure to covid-19: Secondary | ICD-10-CM | POA: Diagnosis not present

## 2019-04-18 NOTE — Telephone Encounter (Signed)
Notified pt w/MD response. Pt states he actually went yesterday to have covid test.. results was NEGATIVE. The provider actually recommended the exact thing Dr. Jenny Reichmann recommended and he has started taking As of yesterday.Marland KitchenJohny Chess

## 2019-04-21 ENCOUNTER — Ambulatory Visit (INDEPENDENT_AMBULATORY_CARE_PROVIDER_SITE_OTHER): Payer: BC Managed Care – PPO | Admitting: Physician Assistant

## 2019-04-21 ENCOUNTER — Encounter: Payer: Self-pay | Admitting: Physician Assistant

## 2019-04-21 VITALS — BP 110/70 | HR 68 | Temp 98.0°F | Ht 73.0 in | Wt 182.0 lb

## 2019-04-21 DIAGNOSIS — K5130 Ulcerative (chronic) rectosigmoiditis without complications: Secondary | ICD-10-CM | POA: Diagnosis not present

## 2019-04-21 DIAGNOSIS — R1032 Left lower quadrant pain: Secondary | ICD-10-CM | POA: Diagnosis not present

## 2019-04-21 NOTE — Patient Instructions (Addendum)
If you are age 45 or older, your body mass index should be between 23-30. Your Body mass index is 24.01 kg/m. If this is out of the aforementioned range listed, please consider follow up with your Primary Care Provider.  If you are age 54 or younger, your body mass index should be between 19-25. Your Body mass index is 24.01 kg/m. If this is out of the aformentioned range listed, please consider follow up with your Primary Care Provider.   We have sent the following medications to your pharmacy for you to pick up at your convenience: Refill Lialda 1.2 gm - 2 tablets (2.4g) twice daily.  Start Levsin one tablet twice daily, take evening dose at bedtime.   Finish Omeprazole, then try OTC Omeprazole daily.   Follow up with Dr. Fuller Plan.

## 2019-04-21 NOTE — Progress Notes (Signed)
Subjective:    Patient ID: Curtis [name], male    DOB: 04/11/1975, 44 y.o.   MRN: [id number]  HPI Curtis [name] is a pleasant 44 year old white male, established with Dr. Fuller Plan with history of ulcerative proctosigmoiditis diagnosed in 2018.  He comes in today for med refills but also has been having problems with lower abdominal pain. His last office visit was in October 2019, he has been maintained on Lialda 4.8 g daily and has done well. Colonoscopy was done in May 2018 with finding of diffuse moderate erythema of the rectum and sigmoid.  Biopsy returned showing chronic active colitis from the proctoscopy sigmoid region, biopsies from the remainder of the colon showed no active inflammation. Patient relates that he had laparoscopic left inguinal hernia repair about 1 year ago with mesh.  He says basically ever since then he has had to position himself awkwardly to have a bowel movement and sometimes to urinate.  He says he has to squat rather than sit because this is more comfortable.  He complains of a pain in the left lower quadrant which will radiate into his groin and testicle.  This had worsened over the past month or so and he actually went to the emergency room about 2 weeks ago.   He had CT of the abdomen and pelvis done with contrast Which showed minimal enlargement of the prostate measuring 4.6 x 3.7 cm, seminal vesicles unremarkable, postsurgical changes from prior inguinal herniorrhaphy bilaterally no recurrent hernias or inflammatory process, no colonic wall thickening mention. Labs were unremarkable.  He was given a course of levofloxacin for possible prostatitis and referred to urology. He has had one urology visit and was told that they would further evaluate him in a month and reassess after he had completed the course of antibiotics. He says after the levofloxacin his stomach and esophagus were on fire.  He went to an urgent care and was given a course of omeprazole 40 mg p.o.  daily.  He has just been on this for about 3 days and has had significant improvement in symptoms. He describes left lower quadrant pain that frequently wakes him up early in the morning hours while he sleeping with sort of an urge for bowel movement or to urinate.  He gets the sharp pain at times down into the groin.  He says he goes into the bathroom, and sort of squats rather than sits on the commode and will immediately have a loose bowel movement.  He says he has to position himself similarly frequently to urinate as well.  He has not noticed any melena or hematochezia.  Sometimes he may have a couple of episodes of loose stool early in the morning hours. He admits to being very anxious with all of these above symptoms. Review of Systems Pertinent positive and negative review of systems were noted in the above HPI section.  All other review of systems was otherwise negative.  Outpatient Encounter Medications as of 04/21/2019  Medication Sig  . Cholecalciferol (VITAMIN D3 PO) Take 1 tablet by mouth daily.  . Cyanocobalamin (VITAMIN B-12 PO) Take 1 tablet by mouth daily.  . mesalamine (LIALDA) 1.2 g EC tablet TAKE 2 TABLETS (2.4 G TOTAL) BY MOUTH 2 (TWO) TIMES DAILY. *NEEDS OFFICE VISIT* (Patient taking differently: Take 2.4 g by mouth 2 (two) times daily. )  . naproxen (NAPROSYN) 500 MG tablet Take 1 tablet (500 mg total) by mouth every 12 (twelve) hours as needed for mild pain or  moderate pain.  Marland Kitchen omeprazole (PRILOSEC) 40 MG capsule Take 40 mg by mouth daily.  . Probiotic Product (PROBIOTIC PO) Take 1 capsule by mouth daily.  . ondansetron (ZOFRAN ODT) 8 MG disintegrating tablet Take 1 tablet (8 mg total) by mouth every 8 (eight) hours as needed for nausea or vomiting. (Patient not taking: Reported on 04/21/2019)  . [DISCONTINUED] Cholecalciferol 25 MCG (1000 UT) tablet Take 1 tablet by mouth daily.  . [DISCONTINUED] levofloxacin (LEVAQUIN) 500 MG tablet Take 1 tablet (500 mg total) by mouth daily.   . [DISCONTINUED] traMADol (ULTRAM) 50 MG tablet Take 1-2 tablets (50-100 mg total) by mouth every 6 (six) hours as needed for moderate pain or severe pain. (Patient not taking: Reported on 04/02/2019)  . [DISCONTINUED] VITAMIN D PO Take 1 capsule by mouth daily.  . [DISCONTINUED] Vitamin D, Ergocalciferol, (DRISDOL) 1.25 MG (50000 UT) CAPS capsule Take 1 capsule (50,000 Units total) by mouth every 7 (seven) days. (Patient not taking: Reported on 04/02/2019)   No facility-administered encounter medications on file as of 04/21/2019.   No Known Allergies Patient Active Problem List   Diagnosis Date Noted  . Dysuria 08/09/2018  . Abnormal LFTs 08/09/2018  . Anxiety 08/09/2018  . Hyperglycemia 08/09/2018  . Recurrent right inguinal hernia s/p laparoscopic repair with mesh 03/14/2018 03/14/2018  . Umbilical hernia s/p primary repair 03/14/2018 03/14/2018  . Left inguinal hernia s/p laparoscopic repair with mesh 03/14/2018 02/06/2018  . Colitis 03/21/2017  . Toe pain, right 10/09/2016  . Encounter for well adult exam with abnormal findings 08/23/2015  . Hypersomnolence 12/09/2014  . Fatigue 11/12/2014  . SOB (shortness of breath) 11/12/2014  . Back muscle spasm 10/29/2014  . Skin change 06/13/2013  . Change in bowel habits 03/10/2013  . IBS (irritable bowel syndrome) 03/10/2013  . Cough 12/14/2009  . Pleurisy 02/20/2009  . INGUINAL HERNIA, RIGHT 04/24/2008  . Diarrhea 04/24/2008   Social History   Socioeconomic History  . Marital status: Single    Spouse name: Not on file  . Number of children: 0  . Years of education: 10  . Highest education level: Not on file  Occupational History  . Occupation: copy room    Employer: [employer]  Tobacco Use  . Smoking status: Never Smoker  . Smokeless tobacco: Never Used  Substance and Sexual Activity  . Alcohol use: Yes    Alcohol/week: 3.0 standard drinks    Types: 3 Cans of beer per week    Comment: Occasinal  . Drug use: Yes    Types:  Marijuana    Comment: 05-11-16  . Sexual activity: Not on file  Other Topics Concern  . Not on file  Social History Narrative   Fun: Play basketball,golf, binge watching    Social Determinants of Health   Financial Resource Strain:   . Difficulty of Paying Living Expenses:   Food Insecurity:   . Worried About Charity fundraiser in the Last Year:   . Arboriculturist in the Last Year:   Transportation Needs:   . Film/video editor (Medical):   Marland Kitchen Lack of Transportation (Non-Medical):   Physical Activity:   . Days of Exercise per Week:   . Minutes of Exercise per Session:   Stress:   . Feeling of Stress :   Social Connections:   . Frequency of Communication with Friends and Family:   . Frequency of Social Gatherings with Friends and Family:   . Attends Religious Services:   . Active  Member of Clubs or Organizations:   . Attends Archivist Meetings:   Marland Kitchen Marital Status:   Intimate Partner Violence:   . Fear of Current or Ex-Partner:   . Emotionally Abused:   Marland Kitchen Physically Abused:   . Sexually Abused:     Curtis [name] family history includes Stroke (age of onset: 18) in his maternal grandmother; Urolithiasis in his father.      Objective:    Vitals:   04/21/19 1537  BP: 110/70  Pulse: 68  Temp: 98 F (36.7 C)    Physical Exam.Well-developed well-nourished  WM  in no acute distress.  Height, Weight,182  BMI 24.01  HEENT; nontraumatic normocephalic, EOMI, PE RR LA, sclera anicteric. Oropharynx; not examined Neck; supple, no JVD Cardiovascular; regular rate and rhythm with S1-S2, no murmur rub or gallop Pulmonary; Clear bilaterally Abdomen; soft, there is some mild tenderness in the left lower quadrant over the area of prior hernia repair,, nondistended, no palpable mass or hepatosplenomegaly, bowel sounds are active Rectal; not done today skin; benign exam, no jaundice rash or appreciable lesions Extremities; no clubbing cyanosis or edema skin warm  and dry Neuro/Psych; alert and oriented x4, grossly nonfocal mood and affect appropriate, somewhat anxious       Assessment & Plan:   #38 44 year old white male with history of ulcerative proctosigmoiditis on Lialda 4.8 g daily with good response. Patient presents with 1 year history of intermittent sharp left lower quadrant pain frequently radiating to the left testicle.  Onset after he underwent left inguinal hernia repair with mesh about 1 year ago. Patient has also had to adjust positioning on the commode to squat rather than sit to more comfortably produce a bowel movement since the hernia repair, and frequently has to do this to urinate as well. Recent increase in the symptoms over the past month precipitating ER visit with probable diagnosis of acute prostatitis. No improvement in symptoms after course of levofloxacin.  I do not think his current symptoms are secondary to exacerbation of proctosigmoiditis, no colonic wall thickening noted on recent CT.  Query if pain is secondary to adhesions/mesh in the left lower quadrant, causing patient to positionally adjust for bowel movements and to urinate.  #2 probable acute esophagitis post antibiotics improving #3 IBS  Plan; continue Lialda 4.8 g daily, refills sent Will add a trial of Levsin 1 p.o. nightly, and 1 p.o. every morning.  Hopefully this will help with early morning lower abdominal pain and urgency  Patient advised to keep his follow-up with urology. He may need follow-up colonoscopy but would like to wait until after he completes urologic work-up.  We have rescheduled his appointment with Dr. Fuller Plan to early June. Happy to see him in the interim if needed.  He will complete the current 1 month course of omeprazole 40 mg p.o. every morning, then switch to OTC omeprazole 1 daily if has recurrence of GERD symptoms.   Per Beagley Genia Harold PA-C 04/21/2019   Cc: [name], MD

## 2019-04-22 NOTE — Progress Notes (Signed)
Reviewed and agree with management plan.  Yania Bogie T. Laury Huizar, MD FACG [facility]  

## 2019-05-06 ENCOUNTER — Ambulatory Visit: Payer: BC Managed Care – PPO | Admitting: Gastroenterology

## 2019-05-14 ENCOUNTER — Other Ambulatory Visit: Payer: Self-pay | Admitting: Gastroenterology

## 2019-05-14 DIAGNOSIS — N411 Chronic prostatitis: Secondary | ICD-10-CM | POA: Diagnosis not present

## 2019-05-14 DIAGNOSIS — R102 Pelvic and perineal pain: Secondary | ICD-10-CM | POA: Diagnosis not present

## 2019-05-19 ENCOUNTER — Telehealth: Payer: Self-pay | Admitting: Gastroenterology

## 2019-05-19 MED ORDER — MESALAMINE 1.2 G PO TBEC: DELAYED_RELEASE_TABLET | ORAL | 1 refills | Status: DC

## 2019-05-19 NOTE — Telephone Encounter (Signed)
Prescription sent to patient's pharmacy until scheduled appt.

## 2019-05-30 DIAGNOSIS — R351 Nocturia: Secondary | ICD-10-CM | POA: Diagnosis not present

## 2019-05-30 DIAGNOSIS — M6289 Other specified disorders of muscle: Secondary | ICD-10-CM | POA: Diagnosis not present

## 2019-05-30 DIAGNOSIS — M6281 Muscle weakness (generalized): Secondary | ICD-10-CM | POA: Diagnosis not present

## 2019-05-30 DIAGNOSIS — M62838 Other muscle spasm: Secondary | ICD-10-CM | POA: Diagnosis not present

## 2019-06-12 ENCOUNTER — Other Ambulatory Visit: Payer: Self-pay

## 2019-06-12 ENCOUNTER — Encounter: Payer: Self-pay | Admitting: Internal Medicine

## 2019-06-12 ENCOUNTER — Ambulatory Visit (INDEPENDENT_AMBULATORY_CARE_PROVIDER_SITE_OTHER): Payer: BC Managed Care – PPO | Admitting: Internal Medicine

## 2019-06-12 VITALS — BP 130/82 | HR 85 | Temp 97.9°F | Ht 73.0 in | Wt 181.0 lb

## 2019-06-12 DIAGNOSIS — K219 Gastro-esophageal reflux disease without esophagitis: Secondary | ICD-10-CM

## 2019-06-12 DIAGNOSIS — R739 Hyperglycemia, unspecified: Secondary | ICD-10-CM

## 2019-06-12 DIAGNOSIS — R1030 Lower abdominal pain, unspecified: Secondary | ICD-10-CM | POA: Diagnosis not present

## 2019-06-12 DIAGNOSIS — J309 Allergic rhinitis, unspecified: Secondary | ICD-10-CM

## 2019-06-12 DIAGNOSIS — K589 Irritable bowel syndrome without diarrhea: Secondary | ICD-10-CM

## 2019-06-12 LAB — URINALYSIS, ROUTINE W REFLEX MICROSCOPIC
Bilirubin Urine: NEGATIVE
Ketones, ur: NEGATIVE
Leukocytes,Ua: NEGATIVE
Nitrite: NEGATIVE
Specific Gravity, Urine: 1.015 (ref 1.000–1.030)
Total Protein, Urine: NEGATIVE
Urine Glucose: NEGATIVE
Urobilinogen, UA: 0.2 (ref 0.0–1.0)
pH: 7.5 (ref 5.0–8.0)

## 2019-06-12 MED ORDER — TRIAMCINOLONE ACETONIDE 55 MCG/ACT NA AERO: 2.0000 | INHALATION_SPRAY | Freq: Every day | NASAL | 12 refills | Status: DC

## 2019-06-12 NOTE — Progress Notes (Signed)
Subjective:    Patient ID: Curtis [name], male    DOB: 02-04-1975, 44 y.o.   MRN: [id number]  HPI  Here with anxiety and very long involved history - begins with s/p bialteral inguinal hernia repair years ago, c/o 2 mo testicle, groin and lower abd pain; 3/31 ct abd with mild bph and abnormal ua, tx with levaquin but has nausea and gi upset so finished 9/10 days; denies gi - Denies worsening reflux, dysphagia, n/v, bowel change or blood.  Now with 2 wks worsening symptoms again assoc with diarrhea this time despite probiotic, also worsening reflux.  Has f/u GI appt next week.  Does have several wks ongoing nasal allergy symptoms with clearish congestion, itch and sneezing, without fever, pain, ST, cough, swelling or wheezing.  Denies urinary symptoms such as dysuria, frequency, urgency, flank pain, hematuria or n/v, fever, chills.   Past Medical History:  Diagnosis Date  . ACNE, CYSTIC    resolved  . Arthritis    spine, early onset  . IBS (irritable bowel syndrome)   . INGUINAL HERNIA, RIGHT 04/24/2008   s/p RIH repair  . Lactose intolerance    diet controlled  . PONV (postoperative nausea and vomiting)    Past Surgical History:  Procedure Laterality Date  . COLONOSCOPY    . HERNIA REPAIR Right 2010   Dr. Grandville Silos  . INGUINAL HERNIA REPAIR Bilateral 03/14/2018   Procedure: LAPAROSCOPIC BILATERAL  INGUINAL HERNIA REPAIR  WITH MESH ERAS [place];  Surgeon: Michael Boston, MD;  Location: [place];  Service: General;  Laterality: Bilateral;  . Surgery for spont pneumothorax Right 1996  . UMBILICAL HERNIA REPAIR N/A 03/14/2018   Procedure: HERNIA REPAIR UMBILICAL ADULT;  Surgeon: Michael Boston, MD;  Location: [place];  Service: General;  Laterality: N/A;  . WISDOM TOOTH EXTRACTION      reports that he has never smoked. He has never used smokeless tobacco. He reports current alcohol use of about 3.0 standard drinks of alcohol per week. He reports  current drug use. Drug: Marijuana. family history includes Stroke (age of onset: 27) in his maternal grandmother; Urolithiasis in his father. No Known Allergies Current Outpatient Medications on File Prior to Visit  Medication Sig Dispense Refill  . Cholecalciferol (VITAMIN D3 PO) Take 1 tablet by mouth daily.    . Cyanocobalamin (VITAMIN B-12 PO) Take 1 tablet by mouth daily.    . mesalamine (LIALDA) 1.2 g EC tablet TAKE 2 TABLETS (2.4 G TOTAL) BY MOUTH 2 (TWO) TIMES DAILY. 360 tablet 1  . naproxen (NAPROSYN) 500 MG tablet Take 1 tablet (500 mg total) by mouth every 12 (twelve) hours as needed for mild pain or moderate pain. 30 tablet 1  . Probiotic Product (PROBIOTIC PO) Take 1 capsule by mouth daily.     No current facility-administered medications on file prior to visit.   Review of Systems All otherwise neg per pt    Objective:   Physical Exam BP 130/82 (BP Location: Left Arm, Patient Position: Sitting, Cuff Size: Large)   Pulse 85   Temp 97.9 F (36.6 C) (Oral)   Ht 6' 1"  (1.854 m)   Wt 181 lb (82.1 kg)   SpO2 98%   BMI 23.88 kg/m  VS noted,  Constitutional: Pt appears in NAD HENT: Head: NCAT.  Right Ear: External ear normal.  Left Ear: External ear normal.  Eyes: . Pupils are equal, round, and reactive to light. Conjunctivae and EOM are normal Nose:  without d/c or deformity Neck: Neck supple. Gross normal ROM Cardiovascular: Normal rate and regular rhythm.   Pulmonary/Chest: Effort normal and breath sounds without rales or wheezing.  Abd:  Soft, NT, ND, + BS, no organomegaly Neurological: Pt is alert. At baseline orientation, motor grossly intact Skin: Skin is warm. No rashes, other new lesions, no LE edema Psychiatric: Pt behavior is normal without agitation  All otherwise neg per pt Lab Results  Component Value Date   WBC 8.6 04/02/2019   HGB 15.3 04/02/2019   HCT 45.3 04/02/2019   PLT 254 04/02/2019   GLUCOSE 122 (H) 04/02/2019   CHOL 204 (H) 08/09/2018    TRIG 237.0 (H) 08/09/2018   HDL 28.50 (L) 08/09/2018   LDLDIRECT 145.0 08/09/2018   LDLCALC 109 (H) 08/23/2015   ALT 43 04/02/2019   AST 25 04/02/2019   NA 138 04/02/2019   K 3.7 04/02/2019   CL 101 04/02/2019   CREATININE 0.95 04/02/2019   BUN 18 04/02/2019   CO2 29 04/02/2019   TSH 1.41 08/09/2018   PSA 1.07 08/09/2018   HGBA1C 5.6 08/09/2018      Assessment & Plan:

## 2019-06-12 NOTE — Patient Instructions (Signed)
Ok to continue the omeprazole, and probiotic  Please take all new medication as prescribed - the nasacort  Please continue all other medications as before, and refills have been done if requested.  Please have the pharmacy call with any other refills you may need.  Please keep your appointments with your specialists as you may have planned - Gastroenterology as you have planned  Please go to the LAB at the blood drawing area for the tests to be done - just the urine testing today  You will be contacted by phone if any changes need to be made immediately.  Otherwise, you could consider a course of doxycycline antibiotic for ? Of prostatitis  Please remember to sign up for MyChart if you have not done so, as this will be important to you in the future with finding out test results, communicating by private email, and scheduling acute appointments online when needed.

## 2019-06-13 ENCOUNTER — Encounter: Payer: Self-pay | Admitting: Internal Medicine

## 2019-06-13 LAB — URINE CULTURE: Result:: NO GROWTH

## 2019-06-14 ENCOUNTER — Other Ambulatory Visit: Payer: Self-pay | Admitting: Internal Medicine

## 2019-06-14 DIAGNOSIS — R3129 Other microscopic hematuria: Secondary | ICD-10-CM

## 2019-06-15 ENCOUNTER — Encounter: Payer: Self-pay | Admitting: Internal Medicine

## 2019-06-15 DIAGNOSIS — J309 Allergic rhinitis, unspecified: Secondary | ICD-10-CM | POA: Insufficient documentation

## 2019-06-15 DIAGNOSIS — R1030 Lower abdominal pain, unspecified: Secondary | ICD-10-CM | POA: Insufficient documentation

## 2019-06-15 DIAGNOSIS — K219 Gastro-esophageal reflux disease without esophagitis: Secondary | ICD-10-CM | POA: Insufficient documentation

## 2019-06-15 NOTE — Assessment & Plan Note (Signed)
Mild to mod, for omeprazole,  to f/u any worsening symptoms or concerns

## 2019-06-15 NOTE — Assessment & Plan Note (Signed)
stable overall by history and exam, recent data reviewed with pt, and pt to continue medical treatment as before,  to f/u any worsening symptoms or concerns  

## 2019-06-15 NOTE — Assessment & Plan Note (Signed)
Mild to mod, for nasacort asd,  to f/u any worsening symptoms or concerns 

## 2019-06-15 NOTE — Assessment & Plan Note (Signed)
Suspect some worsening recently, has f/u with gI next wk

## 2019-06-15 NOTE — Assessment & Plan Note (Addendum)
Cant r/o underlying prostatitis, for ua , consider doxy course if persists  I spent 41 minutes in preparing to see the patient by review of recent labs, imaging and procedures, obtaining and reviewing separately obtained history, communicating with the patient and family or caregiver, ordering medications, tests or procedures, and documenting clinical information in the EHR including the differential Dx, treatment, and any further evaluation and other management of low abd pain, allergies, gerd, ibs, hyperglycemia

## 2019-06-17 ENCOUNTER — Ambulatory Visit (INDEPENDENT_AMBULATORY_CARE_PROVIDER_SITE_OTHER): Payer: BC Managed Care – PPO | Admitting: Gastroenterology

## 2019-06-17 ENCOUNTER — Telehealth: Payer: Self-pay | Admitting: Internal Medicine

## 2019-06-17 ENCOUNTER — Encounter: Payer: Self-pay | Admitting: Gastroenterology

## 2019-06-17 VITALS — BP 120/78 | HR 76 | Ht 73.0 in | Wt 180.0 lb

## 2019-06-17 DIAGNOSIS — R1013 Epigastric pain: Secondary | ICD-10-CM

## 2019-06-17 DIAGNOSIS — R1032 Left lower quadrant pain: Secondary | ICD-10-CM | POA: Diagnosis not present

## 2019-06-17 DIAGNOSIS — R197 Diarrhea, unspecified: Secondary | ICD-10-CM | POA: Diagnosis not present

## 2019-06-17 MED ORDER — PANTOPRAZOLE SODIUM 40 MG PO TBEC: 40.0000 mg | DELAYED_RELEASE_TABLET | Freq: Every day | ORAL | 1 refills | Status: DC

## 2019-06-17 MED ORDER — GLYCOPYRROLATE 2 MG PO TABS: 2.0000 mg | ORAL_TABLET | Freq: Two times a day (BID) | ORAL | 11 refills | Status: DC

## 2019-06-17 NOTE — Patient Instructions (Signed)
We have sent the following medications to your pharmacy for you to pick up at your convenience: glycopyrrolate and pantoprazole.   Stop taking your probiotic.   Thank you for choosing me and [facility].  Pricilla Riffle. Dagoberto Ligas., MD., [facility] Regal

## 2019-06-17 NOTE — Progress Notes (Addendum)
° ° °  History of Present Illness: This is a 44 year old male with lower abdominal pain, diarrhea, epigastric pain.  Seen recently by Nicoletta Ba, PA-C on April 19 and Dr. Cathlean Cower on June 10 for same.  I reviewed their notes and CT AP from March 31.  He relates epigastric pain and occasional substernal burning that worsen while taking a course of Levaquin.  He was treated with a 14-day course of omeprazole and his symptoms resolved.  Since completing omeprazole he has had intermittent epigastric burning pain.  He also has lower abdominal pain associated with gurgling and urgent diarrhea.  Symptoms often wake him around 5 in the morning and he will have similar symptoms several times during the day with less of diarrhea as the day progresses.  His weight has increased 8 pounds since August 2020.  CT AP 04/02/19 IMPRESSION: No acute intra-abdominal or intrapelvic abnormalities. Minimal enlargement of prostate gland.  Current Medications, Allergies, Past Medical History, Past Surgical History, Family History and Social History were reviewed in [employer] record.   Physical Exam: General: Well developed, well nourished, no acute distress Head: Normocephalic and atraumatic Eyes:  sclerae anicteric, EOMI Ears: Normal auditory acuity Mouth: Not examined, mask on during Covid-19 pandemic Lungs: Clear throughout to auscultation Heart: Regular rate and rhythm; no murmurs, rubs or bruits Abdomen: Soft, minimal epigastric and left lower quadrant tenderness and non distended.  Diastases.  No masses, hepatosplenomegaly or hernias noted. Normal Bowel sounds Rectal: Not done Musculoskeletal: Symmetrical with no gross deformities  Pulses:  Normal pulses noted Extremities: No clubbing, cyanosis, edema or deformities noted Neurological: Alert oriented x 4, grossly nonfocal Psychological:  Alert and cooperative.  Anxious.   Assessment and Recommendations:  1.  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   2.  Status post bilateral inguinal hernia repairs and umbilical hernia repair repaired with mesh in March 2020.   3.  Left lower quadrant pain associated with urgent diarrhea.  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  Begin glycopyrrolate 2 mg p.o. twice daily.  Discontinue current probiotic for 1 week and assess symptoms.  If diarrhea does not improve obtain stool studies and if negative a trial of a low FODMAP diet.  If left lower quadrant pain and/or diarrhea persist despite above evaluation consider colonoscopy.  4.  Epigastric pain and reflux symptoms.  Improved but symptoms persist.  Follow antireflux measures.  Begin pantoprazole 40 mg daily for 2 months and reassess.  If symptoms not controlled consider EGD.

## 2019-06-17 NOTE — Telephone Encounter (Signed)
New message:    Pt is calling in reference to his lab results. I have explained to the pt what Dr. Jenny Reichmann wrote in regards to the pt's labs. Pt is still concerned about his RBC and Bacteria levels and would like to have more information. Please advise.

## 2019-06-18 NOTE — Telephone Encounter (Signed)
Sorry, I am unable to do this by email, pt would need OV if wants further discussion, but I dont see the need; the reason for the referral to urology is to make sure no serious problem such as even cancer in the urinary system

## 2019-06-18 NOTE — Telephone Encounter (Signed)
Pt has been informed and expressed understanding.  

## 2019-07-11 ENCOUNTER — Other Ambulatory Visit: Payer: Self-pay | Admitting: Gastroenterology

## 2019-08-08 ENCOUNTER — Ambulatory Visit: Payer: BC Managed Care – PPO | Admitting: Gastroenterology

## 2019-08-12 ENCOUNTER — Other Ambulatory Visit: Payer: Self-pay

## 2019-08-12 ENCOUNTER — Ambulatory Visit (INDEPENDENT_AMBULATORY_CARE_PROVIDER_SITE_OTHER): Payer: BC Managed Care – PPO | Admitting: Internal Medicine

## 2019-08-12 ENCOUNTER — Encounter: Payer: Self-pay | Admitting: Internal Medicine

## 2019-08-12 VITALS — BP 140/90 | HR 65 | Temp 98.0°F | Ht 73.0 in | Wt 183.0 lb

## 2019-08-12 DIAGNOSIS — R739 Hyperglycemia, unspecified: Secondary | ICD-10-CM | POA: Diagnosis not present

## 2019-08-12 DIAGNOSIS — E538 Deficiency of other specified B group vitamins: Secondary | ICD-10-CM | POA: Diagnosis not present

## 2019-08-12 DIAGNOSIS — Z0000 Encounter for general adult medical examination without abnormal findings: Secondary | ICD-10-CM

## 2019-08-12 DIAGNOSIS — E559 Vitamin D deficiency, unspecified: Secondary | ICD-10-CM

## 2019-08-12 NOTE — Progress Notes (Signed)
Subjective:    Patient ID: Curtis [name], male    DOB: August 17, 1975, 44 y.o.   MRN: [id number]  HPI  Here for wellness and f/u;  Overall doing ok;  Pt denies Chest pain, worsening SOB, DOE, wheezing, orthopnea, PND, worsening LE edema, palpitations, dizziness or syncope.  Pt denies neurological change such as new headache, facial or extremity weakness.  Pt denies polydipsia, polyuria, or low sugar symptoms. Pt states overall good compliance with treatment and medications, good tolerability, and has been trying to follow appropriate diet.  Pt denies worsening depressive symptoms, suicidal ideation or panic. No fever, night sweats, wt loss, loss of appetite, or other constitutional symptoms.  Pt states good ability with ADL's, has low fall risk, home safety reviewed and adequate, no other significant changes in hearing or vision, and only occasionally active with exercise. Has gained wt with less active lately.  Has ongoing pelvic pain, plans to continue f/u with urology with prob cysto next it seems, denies fever Wt Readings from Last 3 Encounters:  08/12/19 183 lb (83 kg)  06/17/19 180 lb (81.6 kg)  06/12/19 181 lb (82.1 kg)   Past Medical History:  Diagnosis Date   ACNE, CYSTIC    resolved   Arthritis    spine, early onset   IBS (irritable bowel syndrome)    INGUINAL HERNIA, RIGHT 04/24/2008   s/p RIH repair   Lactose intolerance    diet controlled   PONV (postoperative nausea and vomiting)    Past Surgical History:  Procedure Laterality Date   COLONOSCOPY     HERNIA REPAIR Right 2010   Dr. Rob Bunting HERNIA REPAIR Bilateral 03/14/2018   Procedure: [place];  Surgeon: Michael Boston, MD;  Location: [place];  Service: General;  Laterality: Bilateral;   Surgery for spont pneumothorax Right 7741   UMBILICAL HERNIA REPAIR N/A 03/14/2018   Procedure: HERNIA REPAIR UMBILICAL ADULT;   Surgeon: Michael Boston, MD;  Location: [place];  Service: General;  Laterality: N/A;   WISDOM TOOTH EXTRACTION      reports that he has never smoked. He has never used smokeless tobacco. He reports current alcohol use of about 3.0 standard drinks of alcohol per week. He reports current drug use. Drug: Marijuana. family history includes Stroke (age of onset: 58) in his maternal grandmother; Urolithiasis in his father. Allergies  Allergen Reactions   Levofloxacin Other (See Comments)    GI problems   Current Outpatient Medications on File Prior to Visit  Medication Sig Dispense Refill   Cholecalciferol (VITAMIN D3 PO) Take 1 tablet by mouth daily.     Cyanocobalamin (VITAMIN B-12 PO) Take 1 tablet by mouth daily.     mesalamine (LIALDA) 1.2 g EC tablet TAKE 2 TABLETS (2.4 G TOTAL) BY MOUTH 2 (TWO) TIMES DAILY. 360 tablet 1   naproxen (NAPROSYN) 500 MG tablet Take 1 tablet (500 mg total) by mouth every 12 (twelve) hours as needed for mild pain or moderate pain. 30 tablet 1   Probiotic Product (PROBIOTIC PO) Take 1 capsule by mouth daily.     No current facility-administered medications on file prior to visit.   Review of Systems All otherwise neg per pt     Objective:   Physical Exam BP 140/90 (BP Location: Left Arm, Patient Position: Sitting, Cuff Size: Large)    Pulse 65    Temp 98 F (36.7 C) (Oral)  Ht 6' 1"  (1.854 m)    Wt 183 lb (83 kg)    SpO2 97%    BMI 24.14 kg/m  VS noted,  Constitutional: Pt appears in NAD HENT: Head: NCAT.  Right Ear: External ear normal.  Left Ear: External ear normal.  Eyes: . Pupils are equal, round, and reactive to light. Conjunctivae and EOM are normal Nose: without d/c or deformity Neck: Neck supple. Gross normal ROM Cardiovascular: Normal rate and regular rhythm.   Pulmonary/Chest: Effort normal and breath sounds without rales or wheezing.  Abd:  Soft, NT, ND, + BS, no organomegaly Neurological: Pt is alert. At  baseline orientation, motor grossly intact Skin: Skin is warm. No rashes, other new lesions, no LE edema Psychiatric: Pt behavior is normal without agitation  All otherwise neg per pt Lab Results  Component Value Date   WBC 7.2 08/13/2019   HGB 15.2 08/13/2019   HCT 45.2 08/13/2019   PLT 250 08/13/2019   GLUCOSE 101 (H) 08/13/2019   CHOL 207 (H) 08/13/2019   TRIG 220 (H) 08/13/2019   HDL 27 (L) 08/13/2019   LDLDIRECT 145.0 08/09/2018   LDLCALC 143 (H) 08/13/2019   ALT 57 (H) 08/13/2019   AST 27 08/13/2019   NA 140 08/13/2019   K 4.3 08/13/2019   CL 103 08/13/2019   CREATININE 0.92 08/13/2019   BUN 12 08/13/2019   CO2 28 08/13/2019   TSH 0.73 08/13/2019   PSA 1.0 08/13/2019   HGBA1C 5.4 08/13/2019      Assessment & Plan:

## 2019-08-13 ENCOUNTER — Other Ambulatory Visit: Payer: BC Managed Care – PPO

## 2019-08-13 DIAGNOSIS — E559 Vitamin D deficiency, unspecified: Secondary | ICD-10-CM

## 2019-08-13 DIAGNOSIS — R739 Hyperglycemia, unspecified: Secondary | ICD-10-CM

## 2019-08-13 DIAGNOSIS — E538 Deficiency of other specified B group vitamins: Secondary | ICD-10-CM

## 2019-08-13 DIAGNOSIS — Z0000 Encounter for general adult medical examination without abnormal findings: Secondary | ICD-10-CM

## 2019-08-14 LAB — LIPID PANEL
Cholesterol: 207 mg/dL — ABNORMAL HIGH (ref <200)
HDL: 27 mg/dL — ABNORMAL LOW (ref >=40)
LDL Cholesterol (Calc): 143 mg/dL (calc) — ABNORMAL HIGH
Non-HDL Cholesterol (Calc): 180 mg/dL (calc) — ABNORMAL HIGH (ref <130)
Total CHOL/HDL Ratio: 7.7 (calc) — ABNORMAL HIGH (ref <5.0)
Triglycerides: 220 mg/dL — ABNORMAL HIGH (ref <150)

## 2019-08-14 LAB — CBC WITH DIFFERENTIAL/PLATELET
Absolute Monocytes: 605 cells/uL (ref 200–950)
Basophils Absolute: 22 cells/uL (ref 0–200)
Basophils Relative: 0.3 %
Eosinophils Absolute: 122 cells/uL (ref 15–500)
Eosinophils Relative: 1.7 %
HCT: 45.2 % (ref 38.5–50.0)
Hemoglobin: 15.2 g/dL (ref 13.2–17.1)
Lymphs Abs: 1728 cells/uL (ref 850–3900)
MCH: 30.1 pg (ref 27.0–33.0)
MCHC: 33.6 g/dL (ref 32.0–36.0)
MCV: 89.5 fL (ref 80.0–100.0)
MPV: 10.2 fL (ref 7.5–12.5)
Monocytes Relative: 8.4 %
Neutro Abs: 4723 cells/uL (ref 1500–7800)
Neutrophils Relative %: 65.6 %
Platelets: 250 10*3/uL (ref 140–400)
RBC: 5.05 10*6/uL (ref 4.20–5.80)
RDW: 13 % (ref 11.0–15.0)
Total Lymphocyte: 24 %
WBC: 7.2 10*3/uL (ref 3.8–10.8)

## 2019-08-14 LAB — URINALYSIS, ROUTINE W REFLEX MICROSCOPIC
Bilirubin Urine: NEGATIVE
Glucose, UA: NEGATIVE
Hgb urine dipstick: NEGATIVE
Ketones, ur: NEGATIVE
Leukocytes,Ua: NEGATIVE
Nitrite: NEGATIVE
Protein, ur: NEGATIVE
Specific Gravity, Urine: 1.008 (ref 1.001–1.03)
pH: 6 (ref 5.0–8.0)

## 2019-08-14 LAB — COMPLETE METABOLIC PANEL WITH GFR
AG Ratio: 1.9 (calc) (ref 1.0–2.5)
ALT: 57 U/L — ABNORMAL HIGH (ref 9–46)
AST: 27 U/L (ref 10–40)
Albumin: 4.6 g/dL (ref 3.6–5.1)
Alkaline phosphatase (APISO): 72 U/L (ref 36–130)
BUN: 12 mg/dL (ref 7–25)
CO2: 28 mmol/L (ref 20–32)
Calcium: 9.9 mg/dL (ref 8.6–10.3)
Chloride: 103 mmol/L (ref 98–110)
Creat: 0.92 mg/dL (ref 0.60–1.35)
GFR, Est African American: 116 mL/min/{1.73_m2} (ref >=60)
GFR, Est Non African American: 100 mL/min/{1.73_m2} (ref >=60)
Globulin: 2.4 g/dL (calc) (ref 1.9–3.7)
Glucose, Bld: 101 mg/dL — ABNORMAL HIGH (ref 65–99)
Potassium: 4.3 mmol/L (ref 3.5–5.3)
Sodium: 140 mmol/L (ref 135–146)
Total Bilirubin: 0.7 mg/dL (ref 0.2–1.2)
Total Protein: 7 g/dL (ref 6.1–8.1)

## 2019-08-14 LAB — VITAMIN B12: Vitamin B-12: 829 pg/mL (ref 200–1100)

## 2019-08-14 LAB — HEMOGLOBIN A1C
Hgb A1c MFr Bld: 5.4 %{Hb} (ref <5.7)
Mean Plasma Glucose: 108 (calc)
eAG (mmol/L): 6 (calc)

## 2019-08-14 LAB — PSA: PSA: 1 ng/mL (ref <=4.0)

## 2019-08-14 LAB — TSH: TSH: 0.73 mIU/L (ref 0.40–4.50)

## 2019-08-14 LAB — VITAMIN D 25 HYDROXY (VIT D DEFICIENCY, FRACTURES): Vit D, 25-Hydroxy: 44 ng/mL (ref 30–100)

## 2019-08-16 ENCOUNTER — Encounter: Payer: Self-pay | Admitting: Internal Medicine

## 2019-08-16 NOTE — Assessment & Plan Note (Signed)
Cont oral replacement 

## 2019-08-16 NOTE — Assessment & Plan Note (Signed)

## 2019-08-16 NOTE — Assessment & Plan Note (Signed)
stable overall by history and exam, recent data reviewed with pt, and pt to continue medical treatment as before,  to f/u any worsening symptoms or concerns, for a1c

## 2019-08-16 NOTE — Patient Instructions (Signed)
Please continue all other medications as before, and refills have been done if requested.  Please have the pharmacy call with any other refills you may need.  Please continue your efforts at being more active, low cholesterol diet, and weight control.  You are otherwise up to date with prevention measures today.  Please keep your appointments with your specialists as you may have planned  Please go to the LAB at the blood drawing area for the tests to be done tomorrow  You will be contacted by phone if any changes need to be made immediately.  Otherwise, you will receive a letter about your results with an explanation, but please check with MyChart first.  Please remember to sign up for MyChart if you have not done so, as this will be important to you in the future with finding out test results, communicating by private email, and scheduling acute appointments online when needed.  Please make an Appointment to return for your 1 year visit, or sooner if needed, with Lab testing by Appointment as well, to be done about 3-5 days before at the [facility] (so this is for TWO appointments - please see the scheduling desk as you leave)

## 2019-09-30 ENCOUNTER — Encounter: Payer: Self-pay | Admitting: Gastroenterology

## 2019-09-30 ENCOUNTER — Ambulatory Visit (INDEPENDENT_AMBULATORY_CARE_PROVIDER_SITE_OTHER): Payer: BC Managed Care – PPO | Admitting: Gastroenterology

## 2019-09-30 VITALS — BP 126/74 | HR 72 | Ht 73.0 in | Wt 182.0 lb

## 2019-09-30 DIAGNOSIS — K580 Irritable bowel syndrome with diarrhea: Secondary | ICD-10-CM

## 2019-09-30 DIAGNOSIS — K5130 Ulcerative (chronic) rectosigmoiditis without complications: Secondary | ICD-10-CM | POA: Diagnosis not present

## 2019-09-30 DIAGNOSIS — K219 Gastro-esophageal reflux disease without esophagitis: Secondary | ICD-10-CM | POA: Diagnosis not present

## 2019-09-30 NOTE — Progress Notes (Addendum)
    History of Present Illness: This is a 44 year old male with ulcerative proctosigmoiditis and IBS-D.  He was last seen in June and glycopyrrolate and a low FODMAP diet were started.  In addition pantoprazole 40 mg daily was started for epigastric pain and reflux symptoms.  CBC, TSH, B12, PSA, hemoglobin A1c all normal in August.  He relates persistent problems with diarrhea and fecal urgency in the early morning starting about 5 AM.  He is taking glycopyrrolate in the mornings after these episodes.  He relates that Protonix did not help his dyspeptic symptoms and he started an over-the-counter product that contains ginger which has been very effective.  He relates perineal rectal area discomfort following ejaculation and some difficulties with twinges of pain in his penis.  Current Medications, Allergies, Past Medical History, Past Surgical History, Family History and Social History were reviewed in [employer] record.   Physical Exam: General: Well developed, well nourished, no acute distress Head: Normocephalic and atraumatic Eyes:  sclerae anicteric, EOMI Ears: Normal auditory acuity Mouth: Not examined, mask on during Covid-19 pandemic Lungs: Clear throughout to auscultation Heart: Regular rate and rhythm; no murmurs, rubs or bruits Abdomen: Soft, non tender and non distended. No masses, hepatosplenomegaly or hernias noted. Normal Bowel sounds Rectal: Not done Musculoskeletal: Symmetrical with no gross deformities  Pulses:  Normal pulses noted Extremities: No clubbing, cyanosis, edema or deformities noted Neurological: Alert oriented x 4, grossly nonfocal Psychological:  Alert and cooperative. Anxious.    Assessment and Recommendations:  1.  Ulcerative proctosigmoiditis, well controlled.  Continue Lialda 2.4 g p.o. twice daily. REV in 1 year.  2.  IBS-D.  Early morning fecal urgency and diarrhea are his prominent symptoms.  Change glycopyrrolate to 2 mg po  hs. He may take a second dose in the late morning if he has IBS symptoms in the afternoons or evenings..  3.  Dyspepsia.  Continue current OTC product as needed.  4. GU symptoms.  Follow-up with urology.

## 2019-09-30 NOTE — Patient Instructions (Signed)
If you are age 45 or older, your body mass index should be between 23-30. Your Body mass index is 24.01 kg/m. If this is out of the aforementioned range listed, please consider follow up with your Primary Care Provider.  If you are age 41 or younger, your body mass index should be between 19-25. Your Body mass index is 24.01 kg/m. If this is out of the aformentioned range listed, please consider follow up with your Primary Care Provider.   Start taking glycopyrrolate 69m  at bedtime   [facility].  MPricilla Riffle SDagoberto Ligas, MD., FMarval Regal

## 2020-02-08 ENCOUNTER — Other Ambulatory Visit: Payer: Self-pay | Admitting: Gastroenterology

## 2020-03-09 ENCOUNTER — Encounter: Payer: Self-pay | Admitting: Gastroenterology

## 2020-03-09 ENCOUNTER — Ambulatory Visit (INDEPENDENT_AMBULATORY_CARE_PROVIDER_SITE_OTHER): Payer: BC Managed Care – PPO | Admitting: Gastroenterology

## 2020-03-09 VITALS — BP 140/82 | HR 64 | Ht 72.0 in | Wt 178.0 lb

## 2020-03-09 DIAGNOSIS — K5130 Ulcerative (chronic) rectosigmoiditis without complications: Secondary | ICD-10-CM | POA: Diagnosis not present

## 2020-03-09 DIAGNOSIS — K580 Irritable bowel syndrome with diarrhea: Secondary | ICD-10-CM

## 2020-03-09 DIAGNOSIS — K642 Third degree hemorrhoids: Secondary | ICD-10-CM

## 2020-03-09 NOTE — Patient Instructions (Addendum)
Start over the counter preparation H suppositories daily x 10 days then daily as needed.   Thank you for choosing me and [facility].  Pricilla Riffle. Dagoberto Ligas., MD., [facility]al

## 2020-03-09 NOTE — Progress Notes (Signed)
    History of Present Illness: This is a 45 year old male with ulcerative colitis and IBS complaining of rectal and pelvic pain and an anal lesion.  He was diagnosed with ulcerative proctosigmoiditis in May 2018.  He complains of rectal pain and pelvic pain that typically occurs at 5 or 6 in the morning and is associated with an urgent bowel movement. Glycopyrrolate has not improved his symptoms.  He has been evaluated by Dr. Jeffie Pollock, Urology, and pelvic floor dysfunction was diagnosed.  He had his first pelvic floor physical therapy session with temporary improvement in symptoms.   He also notes a small prolapsing lesion in his anal canal that occurs intermittently with bowel movements.  This reduces spontaneously on occasion and he reduces it manually on occasion.  Denies weight loss, abdominal pain, constipation, change in stool caliber, melena, hematochezia, nausea, vomiting, dysphagia, reflux symptoms, chest pain.   Current Medications, Allergies, Past Medical History, Past Surgical History, Family History and Social History were reviewed in [employer] record.   Physical Exam: General: Well developed, well nourished, no acute distress Head: Normocephalic and atraumatic Eyes: Sclerae anicteric, EOMI Ears: Normal auditory acuity Mouth: Not examined, mask on during Covid-19 pandemic Lungs: Clear throughout to auscultation Heart: Regular rate and rhythm; no murmurs, rubs or bruits Abdomen: Soft, non tender and non distended. No masses, hepatosplenomegaly or hernias noted. Normal Bowel sounds Rectal: small external skin tag, no other lesions, no tenderness, Hemoccult negative brown stool in the vault Musculoskeletal: Symmetrical with no gross deformities  Pulses:  Normal pulses noted Extremities: No clubbing, cyanosis, edema or deformities noted Neurological: Alert oriented x 4, grossly nonfocal Psychological:  Alert and cooperative.  Anxious   Assessment and  Recommendations:  1.  Ulcerative proctosigmoiditis, well controlled.  Continue Lialda 2.4 g p.o. twice daily. REV in 1 year.   2.  Pelvic floor dysfunction associated with rectal and pelvic pain.  Continue pelvic floor physical therapy as recommended under the care of Dr. Jeffie Pollock.  3. IBS-D.  Continue glycopyrrolate 2 mg p.o. twice daily as needed.  4. Suspected grade III internal hemorrhoids.  Attempt to avoid straining with bowel movements.  Begin Preparation H Supp 1 PR daily for 10 days and then PR daily as needed.  He is advised to contact us if these symptoms persist.  Consider further evaluation with anoscopy, flexible sigmoidoscopy or colonoscopy if symptoms persist.  5.  Health related anxiety.

## 2020-06-24 ENCOUNTER — Emergency Department (HOSPITAL_BASED_OUTPATIENT_CLINIC_OR_DEPARTMENT_OTHER)
Admission: EM | Admit: 2020-06-24 | Discharge: 2020-06-24 | Disposition: A | Discharge status: Home / Self Care | Payer: BC Managed Care – PPO | Attending: Emergency Medicine | Admitting: Emergency Medicine

## 2020-06-24 ENCOUNTER — Telehealth (INDEPENDENT_AMBULATORY_CARE_PROVIDER_SITE_OTHER): Payer: BC Managed Care – PPO | Admitting: Family Medicine

## 2020-06-24 ENCOUNTER — Encounter (HOSPITAL_BASED_OUTPATIENT_CLINIC_OR_DEPARTMENT_OTHER): Payer: Self-pay | Admitting: Emergency Medicine

## 2020-06-24 ENCOUNTER — Other Ambulatory Visit: Payer: Self-pay

## 2020-06-24 ENCOUNTER — Emergency Department (HOSPITAL_BASED_OUTPATIENT_CLINIC_OR_DEPARTMENT_OTHER): Payer: BC Managed Care – PPO | Admitting: Radiology

## 2020-06-24 DIAGNOSIS — R0789 Other chest pain: Secondary | ICD-10-CM | POA: Diagnosis not present

## 2020-06-24 DIAGNOSIS — R059 Cough, unspecified: Secondary | ICD-10-CM | POA: Insufficient documentation

## 2020-06-24 DIAGNOSIS — R0602 Shortness of breath: Secondary | ICD-10-CM | POA: Diagnosis not present

## 2020-06-24 DIAGNOSIS — R0981 Nasal congestion: Secondary | ICD-10-CM | POA: Diagnosis not present

## 2020-06-24 DIAGNOSIS — R042 Hemoptysis: Secondary | ICD-10-CM

## 2020-06-24 DIAGNOSIS — R0600 Dyspnea, unspecified: Secondary | ICD-10-CM | POA: Diagnosis not present

## 2020-06-24 DIAGNOSIS — R079 Chest pain, unspecified: Secondary | ICD-10-CM | POA: Insufficient documentation

## 2020-06-24 LAB — COMPREHENSIVE METABOLIC PANEL
ALT: 23 U/L (ref 0–44)
AST: 16 U/L (ref 15–41)
Albumin: 4.7 g/dL (ref 3.5–5.0)
Alkaline Phosphatase: 70 U/L (ref 38–126)
Anion gap: 11 (ref 5–15)
BUN: 19 mg/dL (ref 6–20)
CO2: 25 mmol/L (ref 22–32)
Calcium: 9.6 mg/dL (ref 8.9–10.3)
Chloride: 101 mmol/L (ref 98–111)
Creatinine, Ser: 1.03 mg/dL (ref 0.61–1.24)
GFR, Estimated: >60 mL/min (ref >60)
Glucose, Bld: 107 mg/dL — ABNORMAL HIGH (ref 70–99)
Potassium: 3.8 mmol/L (ref 3.5–5.1)
Sodium: 137 mmol/L (ref 135–145)
Total Bilirubin: 0.5 mg/dL (ref 0.3–1.2)
Total Protein: 7.2 g/dL (ref 6.5–8.1)

## 2020-06-24 LAB — CBC WITH DIFFERENTIAL/PLATELET
Abs Immature Granulocytes: 0.03 10*3/uL (ref 0.00–0.07)
Basophils Absolute: 0 10*3/uL (ref 0.0–0.1)
Basophils Relative: 0 %
Eosinophils Absolute: 0 10*3/uL (ref 0.0–0.5)
Eosinophils Relative: 0 %
HCT: 44.6 % (ref 39.0–52.0)
Hemoglobin: 15.3 g/dL (ref 13.0–17.0)
Immature Granulocytes: 0 %
Lymphocytes Relative: 17 %
Lymphs Abs: 1.7 10*3/uL (ref 0.7–4.0)
MCH: 29.6 pg (ref 26.0–34.0)
MCHC: 34.3 g/dL (ref 30.0–36.0)
MCV: 86.3 fL (ref 80.0–100.0)
Monocytes Absolute: 1 10*3/uL (ref 0.1–1.0)
Monocytes Relative: 10 %
Neutro Abs: 7.3 10*3/uL (ref 1.7–7.7)
Neutrophils Relative %: 73 %
Platelets: 267 10*3/uL (ref 150–400)
RBC: 5.17 MIL/uL (ref 4.22–5.81)
RDW: 13 % (ref 11.5–15.5)
WBC: 10.1 10*3/uL (ref 4.0–10.5)
nRBC: 0 % (ref 0.0–0.2)

## 2020-06-24 LAB — BRAIN NATRIURETIC PEPTIDE: B Natriuretic Peptide: 19 pg/mL (ref 0.0–100.0)

## 2020-06-24 LAB — TROPONIN I (HIGH SENSITIVITY): Troponin I (High Sensitivity): <2 ng/L (ref <18)

## 2020-06-24 NOTE — ED Triage Notes (Signed)
Pt arrives to ED with c/o of shortness of breath, congestion, and rhinorrhea. Pt reports frontal/maxillary sinus pressure w/ headaches, clear rhinorrhea, and post nasal drip. Pt does report brown-tinged mucous x1 week that is hacked up. Pt denies cough, sore throat, fever,  lymphoadenopathy, photosensitivity, and CP. However pt reports waking up abruptly in the middle of the night SOB, but no associated SOB during day. No OTC meds or recent antibiotics.

## 2020-06-24 NOTE — Patient Instructions (Signed)
Seek prompt inperson evaluation at the Urgent care or the [facility] right away as we discussed. If worsening, worsening CP or difficulty breathing please call 911.    I hope you are feeling better soon!  It was nice to meet you today. I help [facility] out with telemedicine visits on Tuesdays and Thursdays and am available for visits on those days. If you have any concerns or questions following this visit please schedule a follow up visit with your Primary Care doctor or seek care at a local urgent care clinic to avoid delays in care.

## 2020-06-24 NOTE — Progress Notes (Signed)
Virtual Visit via Video Note  I connected with Kaien  on 06/24/20 at 12:00 PM EDT by a video enabled telemedicine application and verified that I am speaking with the correct person using two identifiers.  Location patient: home, [place] Location provider:work or home office Persons participating in the virtual visit: patient, provider  I discussed the limitations of evaluation and management by telemedicine and the availability of in person appointments. The patient expressed understanding and agreed to proceed.   HPI:  Acute telemedicine visit for : -Onset: 6/16 -Symptoms include: started with nasal congestion, sinus congestion, now  has had some SOB - mainly when lies down, a little CP, also has started hacking up dark brown mucus, bowels have been worse than usual the last few days -Denies:fever, body aches, inability to eat/drink/get out of bed -Pertinent past medical history:  -Pertinent medication allergies:  Allergies  Allergen Reactions   Levofloxacin Other (See Comments)    GI problems  -COVID-19 vaccine status: 2 doses  ROS: See pertinent positives and negatives per HPI.  Past Medical History:  Diagnosis Date   ACNE, CYSTIC    resolved   Arthritis    spine, early onset   IBS (irritable bowel syndrome)    INGUINAL HERNIA, RIGHT 04/24/2008   s/p RIH repair   Lactose intolerance    diet controlled   PONV (postoperative nausea and vomiting)     Past Surgical History:  Procedure Laterality Date   COLONOSCOPY     HERNIA REPAIR Right 2010   Dr. Rob Bunting HERNIA REPAIR Bilateral 03/14/2018   Procedure: LAPAROSCOPIC BILATERAL  INGUINAL HERNIA REPAIR  WITH [place];  Surgeon: Michael Boston, MD;  Location: [place];  Service: General;  Laterality: Bilateral;   Surgery for spont pneumothorax Right 3893   UMBILICAL HERNIA REPAIR N/A 03/14/2018   Procedure: HERNIA REPAIR UMBILICAL ADULT;  Surgeon: Michael Boston, MD;  Location: [facility];  Service: General;  Laterality: N/A;   WISDOM TOOTH EXTRACTION       Current Outpatient Medications:    Cholecalciferol (VITAMIN D3 PO), Take 1 tablet by mouth daily., Disp: , Rfl:    Cyanocobalamin (VITAMIN B-12 PO), Take 1 tablet by mouth daily., Disp: , Rfl:    glycopyrrolate (ROBINUL) 2 MG tablet, Take 1 tablet (2 mg total) by mouth at bedtime., Disp: , Rfl:    mesalamine (LIALDA) 1.2 g EC tablet, TAKE 2 TABLETS BY MOUTH 2 TIMES DAILY., Disp: 360 tablet, Rfl: 1   naproxen (NAPROSYN) 500 MG tablet, Take 1 tablet (500 mg total) by mouth every 12 (twelve) hours as needed for mild pain or moderate pain., Disp: 30 tablet, Rfl: 1   Probiotic Product (PROBIOTIC PO), Take 1 capsule by mouth daily., Disp: , Rfl:   EXAM:  VITALS per patient if applicable:  GENERAL: alert, oriented, appears well and in no acute distress  HEENT: atraumatic, conjunttiva clear, no obvious abnormalities on inspection of external nose and ears  NECK: normal movements of the head and neck  LUNGS: on inspection no signs of respiratory distress, breathing rate appears normal, no obvious gross SOB, gasping or wheezing  CV: no obvious cyanosis  MS: moves all visible extremities without noticeable abnormality  PSYCH/NEURO: pleasant and cooperative, no obvious depression or anxiety, speech and thought processing grossly intact  ASSESSMENT AND PLAN:  Discussed the following assessment and plan:  Dyspnea, unspecified type  Chest discomfort  possible Hemoptysis  -we discussed possible serious and likely etiologies,  options for evaluation and workup, limitations of telemedicine visit vs in person visit, treatment, treatment risks and precautions.  Given the dyspnea, chest discomfort and possible hemoptysis advised prompt inperson evaluation today.  Discussed options for inperson care if PCP office not available. He agrees to go to the [facility] or [facility] today and not deleat. Did let this patient know  that I only do telemedicine on Tuesdays and Thursdays for [facility]. Advised to schedule follow up visit with PCP or [facility] if any further questions or concerns to avoid delays in care.   I discussed the assessment and treatment plan with the patient. Spent 21 minutes on this visit.The patient was provided an opportunity to ask questions and all were answered. The patient agreed with the plan and demonstrated an understanding of the instructions.     Lucretia Kern, DO

## 2020-06-24 NOTE — Discharge Instructions (Addendum)
If you develop high fever, severe cough or cough with blood, trouble breathing, severe headache, neck pain/stiffness, vomiting, or any other new/concerning symptoms then return to the ER for evaluation

## 2020-06-24 NOTE — ED Provider Notes (Signed)
[facility] EMERGENCY DEPT Provider Note   CSN: [id number] Arrival date & time: 06/24/20  1402     History Chief Complaint  Patient presents with   Shortness of Curtis [name] is a 45 y.o. male.  HPI 45 year old male presents with a chief complaint of chest pain and cough with sputum.  He endorses a chronic cough with occasional bits of brown sputum and is clear sputum for a long time but over the last week he is having more brown sputum.  No fevers.  The last 3 nights when going to bed just as he is falling asleep he will become short of breath and wake up.  He has been intermittently having some mild left-sided chest pain that is not just at night.  No leg swelling.  He is having some nasal congestion that is clear with occasional brown sputum and it feels like a little bit of discomfort in his throat.  Took a send out COVID test that he states should be back in 24 hours or so at around 11 AM but does not have the results.  Past Medical History:  Diagnosis Date   ACNE, CYSTIC    resolved   Arthritis    spine, early onset   IBS (irritable bowel syndrome)    INGUINAL HERNIA, RIGHT 04/24/2008   s/p RIH repair   Lactose intolerance    diet controlled   PONV (postoperative nausea and vomiting)     Patient Active Problem List   Diagnosis Date Noted   Vitamin D deficiency 08/12/2019   B12 deficiency 08/12/2019   GERD (gastroesophageal reflux disease) 06/15/2019   Allergic rhinitis 06/15/2019   Lower abdominal pain 06/15/2019   Dysuria 08/09/2018   Abnormal LFTs 08/09/2018   Anxiety 08/09/2018   Hyperglycemia 08/09/2018   Recurrent right inguinal hernia s/p laparoscopic repair with mesh 03/14/2018 [id number]/8563   Umbilical hernia s/p primary repair 03/14/2018 03/14/2018   Left inguinal hernia s/p laparoscopic repair with mesh 03/14/2018 02/06/2018   Colitis 03/21/2017   Toe pain, right 10/09/2016   Preventative health care 08/23/2015    Hypersomnolence 12/09/2014   Fatigue 11/12/2014   SOB (shortness of breath) 11/12/2014   Back muscle spasm 10/29/2014   Skin change 06/13/2013   Change in bowel habits 03/10/2013   IBS (irritable bowel syndrome) 03/10/2013   Cough 12/14/2009   Pleurisy 02/20/2009   INGUINAL HERNIA, RIGHT 04/24/2008   Diarrhea 04/24/2008    Past Surgical History:  Procedure Laterality Date   COLONOSCOPY     HERNIA REPAIR Right 2010   Dr. Rob Bunting HERNIA REPAIR Bilateral 03/14/2018   Procedure: LAPAROSCOPIC BILATERAL  INGUINAL [place];  Surgeon: Michael Boston, MD;  Location: [facility];  Service: General;  Laterality: Bilateral;   Surgery for spont pneumothorax Right 1497   UMBILICAL HERNIA REPAIR N/A 03/14/2018   Procedure: HERNIA REPAIR UMBILICAL ADULT;  Surgeon: Michael Boston, MD;  Location: [place];  Service: General;  Laterality: N/A;   WISDOM TOOTH EXTRACTION         Family History  Problem Relation Age of Onset   Urolithiasis Father        Had kidney removed at age 21   Stroke Maternal Grandmother 59   Colon cancer Neg Hx    Esophageal cancer Neg Hx    Pancreatic cancer Neg Hx    Prostate cancer Neg Hx    Rectal cancer Neg Hx  Stomach cancer Neg Hx     Social History   Tobacco Use   Smoking status: Never   Smokeless tobacco: Never  Vaping Use   Vaping Use: Former  Substance Use Topics   Alcohol use: Yes    Alcohol/week: 3.0 standard drinks    Types: 3 Cans of beer per week    Comment: Occasinal   Drug use: Yes    Types: Marijuana    Comment: 05-11-16    Home Medications Prior to Admission medications   Medication Sig Start Date End Date Taking? Authorizing Provider  mesalamine (LIALDA) 1.2 g EC tablet TAKE 2 TABLETS BY MOUTH 2 TIMES DAILY. 02/09/20  Yes Ladene Artist, MD  Cholecalciferol (VITAMIN D3 PO) Take 1 tablet by mouth daily.    [provider]  Cyanocobalamin (VITAMIN B-12  PO) Take 1 tablet by mouth daily.    [provider]  glycopyrrolate (ROBINUL) 2 MG tablet Take 1 tablet (2 mg total) by mouth at bedtime. 09/30/19   Ladene Artist, MD  naproxen (NAPROSYN) 500 MG tablet Take 1 tablet (500 mg total) by mouth every 12 (twelve) hours as needed for mild pain or moderate pain. 03/14/18   Michael Boston, MD  Probiotic Product (PROBIOTIC PO) Take 1 capsule by mouth daily.    [provider]    Allergies    Levofloxacin  Review of Systems   Review of Systems  Constitutional:  Negative for fever.  HENT:  Positive for congestion, postnasal drip, rhinorrhea and sore throat.   Respiratory:  Positive for cough and shortness of breath.   Cardiovascular:  Positive for chest pain. Negative for leg swelling.  All other systems reviewed and are negative.  Physical Exam Updated Vital Signs BP 123/83   Pulse 76   Temp 98.6 F (37 C) (Oral)   Resp 13   Ht 6' 1"  (1.854 m)   Wt 81.6 kg   SpO2 97%   BMI 23.75 kg/m   Physical Exam Vitals and nursing note reviewed.  Constitutional:      General: He is not in acute distress.    Appearance: He is well-developed. He is not ill-appearing or diaphoretic.  HENT:     Head: Normocephalic and atraumatic.     Right Ear: External ear normal.     Left Ear: External ear normal.     Nose: Nose normal.  Eyes:     General:        Right eye: No discharge.        Left eye: No discharge.  Cardiovascular:     Rate and Rhythm: Normal rate and regular rhythm.     Heart sounds: Normal heart sounds.  Pulmonary:     Effort: Pulmonary effort is normal.     Breath sounds: Normal breath sounds. No wheezing, rhonchi or rales.  Abdominal:     Palpations: Abdomen is soft.     Tenderness: There is no abdominal tenderness.  Musculoskeletal:     Cervical back: Neck supple.     Right lower leg: No edema.     Left lower leg: No edema.  Skin:    General: Skin is warm and dry.  Neurological:     Mental Status: He is  alert.  Psychiatric:        Mood and Affect: Mood is not anxious.    ED Results / Procedures / Treatments   Labs (all labs ordered are listed, but only abnormal results are displayed) Labs Reviewed  COMPREHENSIVE  METABOLIC PANEL - Abnormal; Notable for the following components:      Result Value   Glucose, Bld 107 (*)    All other components within normal limits  BRAIN NATRIURETIC PEPTIDE  CBC WITH DIFFERENTIAL/PLATELET  TROPONIN I (HIGH SENSITIVITY)    EKG EKG Interpretation  Date/Time:  Thursday June 24 2020 14:23:12 EDT Ventricular Rate:  82 PR Interval:  144 QRS Duration: 87 QT Interval:  379 QTC Calculation: 443 R Axis:   86 Text Interpretation: Sinus rhythm no acute ST/T changes No old tracing to compare Confirmed by Sherwood Gambler [phone]) on 06/24/2020 2:32:06 PM  Radiology DG Chest 2 View  Result Date: 06/24/2020 CLINICAL DATA:  Shortness of breath and cough EXAM: CHEST - 2 VIEW COMPARISON:  12/08/2014 FINDINGS: Cardiac shadows within normal limits. The lungs are well aerated bilaterally. Apical scarring is noted stable from the prior exam. No focal infiltrate or sizable effusion is seen. No pneumothorax is noted. No acute bony abnormality is seen. IMPRESSION: No acute abnormality noted. Electronically Signed   By: Inez Catalina M.D.   On: 06/24/2020 15:15    Procedures Procedures   Medications Ordered in ED Medications - No data to display  ED Course  I have reviewed the triage vital signs and the nursing notes.  Pertinent labs & imaging results that were available during my care of the patient were reviewed by me and considered in my medical decision making (see chart for details).    MDM Rules/Calculators/A&P                          Overall this is likely an upper respiratory issue.  Chest x-ray viewed and is clear as well as lungs are clear.  He has already sent off a PCR COVID test which should come back fairly soon.  Otherwise given he is hypertensive  with some intermittent chest pain I sent labs including a troponin.  Fortunately this is all benign.  Doubt bacterial infection, probably a viral illness.  He appears stable for discharge home to follow-up with PCP if not improving. Final Clinical Impression(s) / ED Diagnoses Final diagnoses:  Cough    Rx / DC Orders ED Discharge Orders     None        Sherwood Gambler, MD 06/24/20 1542

## 2020-08-06 ENCOUNTER — Other Ambulatory Visit: Payer: Self-pay | Admitting: Gastroenterology

## 2020-09-01 ENCOUNTER — Other Ambulatory Visit: Payer: Self-pay | Admitting: Gastroenterology

## 2020-10-06 ENCOUNTER — Ambulatory Visit (INDEPENDENT_AMBULATORY_CARE_PROVIDER_SITE_OTHER): Payer: BC Managed Care – PPO | Admitting: Gastroenterology

## 2020-10-06 ENCOUNTER — Encounter: Payer: Self-pay | Admitting: Gastroenterology

## 2020-10-06 VITALS — BP 126/80 | HR 79 | Ht 73.0 in | Wt 183.1 lb

## 2020-10-06 DIAGNOSIS — K5130 Ulcerative (chronic) rectosigmoiditis without complications: Secondary | ICD-10-CM

## 2020-10-06 DIAGNOSIS — K580 Irritable bowel syndrome with diarrhea: Secondary | ICD-10-CM

## 2020-10-06 DIAGNOSIS — K642 Third degree hemorrhoids: Secondary | ICD-10-CM | POA: Diagnosis not present

## 2020-10-06 MED ORDER — HYDROCORTISONE ACETATE 25 MG RE SUPP: 25.0000 mg | Freq: Every evening | RECTAL | 0 refills | Status: DC

## 2020-10-06 NOTE — Patient Instructions (Signed)
We have sent the following medications to your pharmacy for you to pick up at your convenience: Anusol suppositories.   You can start over the counter IBgard taking 1-2 capsules by mouth three times a day as needed.   Call back in several weeks if your symptoms are not better.   Due to recent changes in healthcare laws, you may see the results of your imaging and laboratory studies on MyChart before your provider has had a chance to review them.  We understand that in some cases there may be results that are confusing or concerning to you. Not all laboratory results come back in the same time frame and the provider may be waiting for multiple results in order to interpret others.  Please give [place] 48 hours in order for your provider to thoroughly review all the results before contacting the office for clarification of your results.   The [facility] providers would like to encourage you to use [facility] to communicate with providers for non-urgent requests or questions.  Due to long hold times on the telephone, sending your provider a message by [facility] may be a faster and more efficient way to get a response.  Please allow 48 business hours for a response.  Please remember that this is for non-urgent requests.   Thank you for choosing me and [facility].  Pricilla Riffle. Dagoberto Ligas., MD., Mar[facility] Regal

## 2020-10-06 NOTE — Progress Notes (Signed)
    History of Present Illness: This is a 45 year old male with rectal pain, hemorrhoid prolapse following bowel movements.  After almost every bowel movement he needs to manually reduce his hemorrhoids.  Hemorrhoids flare when his diarrhea flares which recently has been every few weeks.  He has persistent problems with lower abdominal pain associated with urgent diarrhea.  Symptoms are typically bothersome from around 5 in the morning through noon.  Robinul was ineffective.    Current Medications, Allergies, Past Medical History, Past Surgical History, Family History and Social History were reviewed in [employer] record.   Physical Exam: General: Well developed, well nourished, no acute distress Head: Normocephalic and atraumatic Eyes: Sclerae anicteric, EOMI Ears: Normal auditory acuity Mouth: Not examined, mask on during Covid-19 pandemic Lungs: Clear throughout to auscultation Heart: Regular rate and rhythm; no murmurs, rubs or bruits Abdomen: Soft, non tender and non distended. No masses, hepatosplenomegaly or hernias noted. Normal Bowel sounds Rectal: No external lesions, no internal tenderness, palpable smooth rounded areas c/w internal hemorrhoids, heme neg stool Musculoskeletal: Symmetrical with no gross deformities  Pulses:  Normal pulses noted Extremities: No clubbing, cyanosis, edema or deformities noted Neurological: Alert oriented x 4, grossly nonfocal Psychological:  Alert and cooperative. Anxious.    Assessment and Recommendations:  Ulcerative proctosigmoiditis, well controlled.  Continue Lialda 2.4 g twice daily. IBS-D. Intermittent lower abdominal pain, diarrhea, urgency. IBgard 1-2 po tid prn.  Advised to take IBgard at bedtime and in the morning to better control symptoms. Pelvic floor dysfunction with rectal and pelvic pain.  Following with Urology and pelvic floor PT.  Suspected Grade 3 hemorrhoids.  Begin Anusol HC suppositories PR HS for 7  days and then HS as needed.  If symptoms persist will proceed colonoscopy and then with surgical referral for further management. Health related anxiety.

## 2021-01-26 ENCOUNTER — Other Ambulatory Visit: Payer: Self-pay

## 2021-01-26 ENCOUNTER — Encounter: Payer: Self-pay | Admitting: Internal Medicine

## 2021-01-26 ENCOUNTER — Ambulatory Visit (INDEPENDENT_AMBULATORY_CARE_PROVIDER_SITE_OTHER): Payer: 59 | Admitting: Internal Medicine

## 2021-01-26 ENCOUNTER — Ambulatory Visit (INDEPENDENT_AMBULATORY_CARE_PROVIDER_SITE_OTHER): Payer: 59

## 2021-01-26 VITALS — BP 142/86 | HR 96 | Temp 98.3°F | Ht 73.0 in | Wt 177.6 lb

## 2021-01-26 DIAGNOSIS — F419 Anxiety disorder, unspecified: Secondary | ICD-10-CM

## 2021-01-26 DIAGNOSIS — Z0000 Encounter for general adult medical examination without abnormal findings: Secondary | ICD-10-CM | POA: Diagnosis not present

## 2021-01-26 DIAGNOSIS — K580 Irritable bowel syndrome with diarrhea: Secondary | ICD-10-CM

## 2021-01-26 DIAGNOSIS — E538 Deficiency of other specified B group vitamins: Secondary | ICD-10-CM

## 2021-01-26 DIAGNOSIS — E7800 Pure hypercholesterolemia, unspecified: Secondary | ICD-10-CM

## 2021-01-26 DIAGNOSIS — Z0001 Encounter for general adult medical examination with abnormal findings: Secondary | ICD-10-CM

## 2021-01-26 DIAGNOSIS — R053 Chronic cough: Secondary | ICD-10-CM | POA: Diagnosis not present

## 2021-01-26 DIAGNOSIS — F1290 Cannabis use, unspecified, uncomplicated: Secondary | ICD-10-CM

## 2021-01-26 DIAGNOSIS — R739 Hyperglycemia, unspecified: Secondary | ICD-10-CM

## 2021-01-26 DIAGNOSIS — R253 Fasciculation: Secondary | ICD-10-CM

## 2021-01-26 DIAGNOSIS — E559 Vitamin D deficiency, unspecified: Secondary | ICD-10-CM | POA: Diagnosis not present

## 2021-01-26 DIAGNOSIS — J9383 Other pneumothorax: Secondary | ICD-10-CM | POA: Insufficient documentation

## 2021-01-26 LAB — URINALYSIS, ROUTINE W REFLEX MICROSCOPIC
Bilirubin Urine: NEGATIVE
Ketones, ur: NEGATIVE
Leukocytes,Ua: NEGATIVE
Nitrite: NEGATIVE
Specific Gravity, Urine: >=1.03 — AB (ref 1.000–1.030)
Total Protein, Urine: NEGATIVE
Urine Glucose: NEGATIVE
Urobilinogen, UA: 0.2 (ref 0.0–1.0)
WBC, UA: NONE SEEN (ref 0–?)
pH: 5.5 (ref 5.0–8.0)

## 2021-01-26 LAB — CBC WITH DIFFERENTIAL/PLATELET
Basophils Absolute: 0 10*3/uL (ref 0.0–0.1)
Basophils Relative: 0.3 % (ref 0.0–3.0)
Eosinophils Absolute: 0 10*3/uL (ref 0.0–0.7)
Eosinophils Relative: 0.3 % (ref 0.0–5.0)
HCT: 46.2 % (ref 39.0–52.0)
Hemoglobin: 15.4 g/dL (ref 13.0–17.0)
Lymphocytes Relative: 17.1 % (ref 12.0–46.0)
Lymphs Abs: 1.7 10*3/uL (ref 0.7–4.0)
MCHC: 33.4 g/dL (ref 30.0–36.0)
MCV: 87.7 fl (ref 78.0–100.0)
Monocytes Absolute: 1 10*3/uL (ref 0.1–1.0)
Monocytes Relative: 9.9 % (ref 3.0–12.0)
Neutro Abs: 7.4 10*3/uL (ref 1.4–7.7)
Neutrophils Relative %: 72.4 % (ref 43.0–77.0)
Platelets: 298 10*3/uL (ref 150.0–400.0)
RBC: 5.26 Mil/uL (ref 4.22–5.81)
RDW: 13.7 % (ref 11.5–15.5)
WBC: 10.2 10*3/uL (ref 4.0–10.5)

## 2021-01-26 LAB — BASIC METABOLIC PANEL
BUN: 20 mg/dL (ref 6–23)
CO2: 29 mEq/L (ref 19–32)
Calcium: 9.8 mg/dL (ref 8.4–10.5)
Chloride: 98 mEq/L (ref 96–112)
Creatinine, Ser: 1.08 mg/dL (ref 0.40–1.50)
GFR: 82.24 mL/min (ref >60.00)
Glucose, Bld: 94 mg/dL (ref 70–99)
Potassium: 4 mEq/L (ref 3.5–5.1)
Sodium: 138 mEq/L (ref 135–145)

## 2021-01-26 LAB — TSH: TSH: 1.07 u[IU]/mL (ref 0.35–5.50)

## 2021-01-26 LAB — VITAMIN B12: Vitamin B-12: 1057 pg/mL — ABNORMAL HIGH (ref 211–911)

## 2021-01-26 LAB — PSA: PSA: 0.95 ng/mL (ref 0.10–4.00)

## 2021-01-26 LAB — HEPATIC FUNCTION PANEL
ALT: 25 U/L (ref 0–53)
AST: 18 U/L (ref 0–37)
Albumin: 5 g/dL (ref 3.5–5.2)
Alkaline Phosphatase: 87 U/L (ref 39–117)
Bilirubin, Direct: 0.1 mg/dL (ref 0.0–0.3)
Total Bilirubin: 0.4 mg/dL (ref 0.2–1.2)
Total Protein: 7.8 g/dL (ref 6.0–8.3)

## 2021-01-26 LAB — LIPID PANEL
Cholesterol: 230 mg/dL — ABNORMAL HIGH (ref 0–200)
HDL: 29.6 mg/dL — ABNORMAL LOW (ref >39.00)
NonHDL: 200.76
Total CHOL/HDL Ratio: 8
Triglycerides: 285 mg/dL — ABNORMAL HIGH (ref 0.0–149.0)
VLDL: 57 mg/dL — ABNORMAL HIGH (ref 0.0–40.0)

## 2021-01-26 LAB — LDL CHOLESTEROL, DIRECT: Direct LDL: 173 mg/dL

## 2021-01-26 LAB — HEMOGLOBIN A1C: Hgb A1c MFr Bld: 5.7 % (ref 4.6–6.5)

## 2021-01-26 LAB — VITAMIN D 25 HYDROXY (VIT D DEFICIENCY, FRACTURES): VITD: 83.77 ng/mL (ref 30.00–100.00)

## 2021-01-26 NOTE — Progress Notes (Deleted)
Patient ID: Curtis [name], male   DOB: 17-Dec-1975, 46 y.o.   MRN: [id number]         Chief Complaint:: wellness exam and Nasal Congestion  ***       HPI:  Curtis [name] is a 46 y.o. male here for wellness exam                        Also***   Wt Readings from Last 3 Encounters:  01/26/21 177 lb 9.6 oz (80.6 kg)  10/06/20 183 lb 2 oz (83.1 kg)  06/24/20 180 lb (81.6 kg)   BP Readings from Last 3 Encounters:  01/26/21 (!) 142/86  10/06/20 126/80  06/24/20 123/83   Immunization History  Administered Date(s) Administered   Influenza Split 12/07/2010, 12/05/2011   Influenza,inj,Quad PF,6+ Mos 08/20/2013   Td 01/02/2006   Tdap 08/09/2018   Health Maintenance Due  Topic Date Due   INFLUENZA VACCINE  08/02/2020      Past Medical History:  Diagnosis Date   ACNE, CYSTIC    resolved   Arthritis    spine, early onset   IBS (irritable bowel syndrome)    INGUINAL HERNIA, RIGHT 04/24/2008   s/p RIH repair   Lactose intolerance    diet controlled   PONV (postoperative nausea and vomiting)    Past Surgical History:  Procedure Laterality Date   COLONOSCOPY     HERNIA REPAIR Right 2010   Dr. Rob Bunting HERNIA REPAIR Bilateral 03/14/2018   Procedure: [place];  Surgeon: Michael Boston, MD;  Location: [place];  Service: General;  Laterality: Bilateral;   Surgery for spont pneumothorax Right 6122   UMBILICAL HERNIA REPAIR N/A 03/14/2018   Procedure: HERNIA REPAIR UMBILICAL ADULT;  Surgeon: Michael Boston, MD;  Location: [facility];  Service: General;  Laterality: N/A;   WISDOM TOOTH EXTRACTION      reports that he has never smoked. He has never used smokeless tobacco. He reports current alcohol use of about 3.0 standard drinks per week. He reports current drug use. Drug: Marijuana. family history includes Stroke (age of onset: 78) in his maternal grandmother;  Urolithiasis in his father. Allergies  Allergen Reactions   Levofloxacin Other (See Comments)    GI problems   Current Outpatient Medications on File Prior to Visit  Medication Sig Dispense Refill   mesalamine (LIALDA) 1.2 g EC tablet TAKE 2 TABLETS BY MOUTH TWICE A DAY 360 tablet 1   Probiotic Product (PROBIOTIC PO) Take 1 capsule by mouth daily.     hydrocortisone (ANUSOL-HC) 25 MG suppository Place 1 suppository (25 mg total) rectally at bedtime. (Patient not taking: Reported on 01/26/2021) 30 suppository 0   No current facility-administered medications on file prior to visit.        ROS:  All others reviewed and negative.  Objective        PE:  BP (!) 142/86 (BP Location: Left Arm, Patient Position: Sitting, Cuff Size: Normal)    Pulse 96    Temp 98.3 F (36.8 C) (Oral)    Ht 6' 1"  (1.854 m)    Wt 177 lb 9.6 oz (80.6 kg)    SpO2 97%    BMI 23.43 kg/m                 Constitutional: Pt appears in NAD  HENT: Head: NCAT.                Right Ear: External ear normal.                 Left Ear: External ear normal.                Eyes: . Pupils are equal, round, and reactive to light. Conjunctivae and EOM are normal               Nose: without d/c or deformity               Neck: Neck supple. Gross normal ROM               Cardiovascular: Normal rate and regular rhythm.                 Pulmonary/Chest: Effort normal and breath sounds without rales or wheezing.                Abd:  Soft, NT, ND, + BS, no organomegaly               Neurological: Pt is alert. At baseline orientation, motor grossly intact               Skin: Skin is warm. No rashes, no other new lesions, LE edema - ***               Psychiatric: Pt behavior is normal without agitation   Micro: none  Cardiac tracings I have personally interpreted today:  none  Pertinent Radiological findings (summarize): none   Lab Results  Component Value Date   WBC 10.1 06/24/2020   HGB 15.3 06/24/2020   HCT 44.6  06/24/2020   PLT 267 06/24/2020   GLUCOSE 107 (H) 06/24/2020   CHOL 207 (H) 08/13/2019   TRIG 220 (H) 08/13/2019   HDL 27 (L) 08/13/2019   LDLDIRECT 145.0 08/09/2018   LDLCALC 143 (H) 08/13/2019   ALT 23 06/24/2020   AST 16 06/24/2020   NA 137 06/24/2020   K 3.8 06/24/2020   CL 101 06/24/2020   CREATININE 1.03 06/24/2020   BUN 19 06/24/2020   CO2 25 06/24/2020   TSH 0.73 08/13/2019   PSA 1.0 08/13/2019   HGBA1C 5.4 08/13/2019   Assessment/Plan:  Curtis [name] is a 46 y.o. White or Caucasian [1] male with  has a past medical history of ACNE, CYSTIC, Arthritis, IBS (irritable bowel syndrome), INGUINAL HERNIA, RIGHT (04/24/2008), Lactose intolerance, and PONV (postoperative nausea and vomiting).  No problem-specific Assessment & Plan notes found for this encounter.  Followup: No follow-ups on file.  Cathlean Cower, MD 01/26/2021 1:30 PM [facility]

## 2021-01-26 NOTE — Assessment & Plan Note (Signed)
Last vitamin D Lab Results  Component Value Date   VD25OH 44 08/13/2019   Stable, cont oral replacement

## 2021-01-26 NOTE — Assessment & Plan Note (Signed)
>   19 yrs for IBS with diarrhea control - , stable,  to f/u any worsening symptoms or concerns, pt not willing to stop

## 2021-01-26 NOTE — Patient Instructions (Signed)
Please continue all other medications as before, and refills have been done if requested.  Please have the pharmacy call with any other refills you may need.  Please continue your efforts at being more active, low cholesterol diet, and weight control.  You are otherwise up to date with prevention measures today.  Please keep your appointments with your specialists as you may have planned  You will be contacted regarding the referral for: pulmonary  Please go to the XRAY Department in the first floor for the x-ray testing  Please go to the LAB at the blood drawing area for the tests to be done  You will be contacted by phone if any changes need to be made immediately.  Otherwise, you will receive a letter about your results with an explanation, but please check with MyChart first.  Please remember to sign up for MyChart if you have not done so, as this will be important to you in the future with finding out test results, communicating by private email, and scheduling acute appointments online when needed.  Please make an Appointment to return for your 1 year visit, or sooner if needed

## 2021-01-29 ENCOUNTER — Encounter: Payer: Self-pay | Admitting: Internal Medicine

## 2021-01-29 NOTE — Assessment & Plan Note (Signed)
Minor, benign, d/w pt - no specific tx needed

## 2021-01-29 NOTE — Progress Notes (Signed)
Patient ID: Curtis [name], male   DOB: 1975/07/04, 46 y.o.   MRN: [id number]         Chief Complaint:: wellness exam and Nasal Congestion  With chronic cough, anxiety, upper left abd fasiculations, elevated BP and hyperglycemia       HPI:  Curtis [name] is a 46 y.o. male here for wellness exam; declines flu shot, o/w up to date                        Also Denies worsening depressive symptoms, suicidal ideation, or panic; has ongoing anxiety, increased recently it seems for unclear reason.  C/o left upper abd wall recurring twitching with minor discomfort but o/w Denies worsening reflux, abd pain, dysphagia, n/v, bowel change or blood.  Pt denies chest pain, increased sob or doe, wheezing, orthopnea, PND, increased LE swelling, palpitations, dizziness or syncope.   Pt denies polydipsia, polyuria, or new focal neuro s/s.  BP at home < 140/90.  Does have several wks ongoing nasal allergy symptoms with clearish congestion and chronic cough > 6 mo, itch and sneezing, without fever, pain, ST, swelling or wheezing.  Pt denies fever, wt loss, night sweats, loss of appetite, or other constitutional symptoms  Denies worsening reflux, abd pain, dysphagia, n/v, bowel change or blood.but does have stable crampy abd pains and loose watery stools occasionally Wt Readings from Last 3 Encounters:  01/26/21 177 lb 9.6 oz (80.6 kg)  10/06/20 183 lb 2 oz (83.1 kg)  06/24/20 180 lb (81.6 kg)   BP Readings from Last 3 Encounters:  01/26/21 (!) 142/86  10/06/20 126/80  06/24/20 123/83   Immunization History  Administered Date(s) Administered   Influenza Split 12/07/2010, 12/05/2011   Influenza,inj,Quad PF,6+ Mos 08/20/2013   Td 01/02/2006   Tdap 08/09/2018   There are no preventive care reminders to display for this patient.     Past Medical History:  Diagnosis Date   ACNE, CYSTIC    resolved   Arthritis    spine, early onset   IBS (irritable bowel syndrome)    INGUINAL HERNIA, RIGHT  04/24/2008   s/p RIH repair   Lactose intolerance    diet controlled   PONV (postoperative nausea and vomiting)    Past Surgical History:  Procedure Laterality Date   COLONOSCOPY     HERNIA REPAIR Right 2010   Dr. Rob Bunting HERNIA REPAIR Bilateral 03/14/2018   Procedure: LAPAROSCOPIC BILATERAL  INGUINAL HERNIA REPAIR  WITH [facility];  Surgeon: Michael Boston, MD;  Location: [facility];  Service: General;  Laterality: Bilateral;   Surgery for spont pneumothorax Right 2297   UMBILICAL HERNIA REPAIR N/A 03/14/2018   Procedure: HERNIA REPAIR UMBILICAL ADULT;  Surgeon: Michael Boston, MD;  Location: [facility];  Service: General;  Laterality: N/A;   WISDOM TOOTH EXTRACTION      reports that he has never smoked. He has never used smokeless tobacco. He reports current alcohol use of about 3.0 standard drinks per week. He reports current drug use. Drug: Marijuana. family history includes Stroke (age of onset: 38) in his maternal grandmother; Urolithiasis in his father. Allergies  Allergen Reactions   Levofloxacin Other (See Comments)    GI problems   Current Outpatient Medications on File Prior to Visit  Medication Sig Dispense Refill   mesalamine (LIALDA) 1.2 g EC tablet TAKE 2 TABLETS BY MOUTH TWICE A DAY 360 tablet 1  Probiotic Product (PROBIOTIC PO) Take 1 capsule by mouth daily.     hydrocortisone (ANUSOL-HC) 25 MG suppository Place 1 suppository (25 mg total) rectally at bedtime. (Patient not taking: Reported on 01/26/2021) 30 suppository 0   No current facility-administered medications on file prior to visit.        ROS:  All others reviewed and negative.  Objective        PE:  BP (!) 142/86 (BP Location: Left Arm, Patient Position: Sitting, Cuff Size: Normal)    Pulse 96    Temp 98.3 F (36.8 C) (Oral)    Ht 6' 1"  (1.854 m)    Wt 177 lb 9.6 oz (80.6 kg)    SpO2 97%    BMI 23.43 kg/m                 Constitutional: Pt appears in  NAD               HENT: Head: NCAT.                Right Ear: External ear normal.                 Left Ear: External ear normal.                Eyes: . Pupils are equal, round, and reactive to light. Conjunctivae and EOM are normal               Nose: without d/c or deformity               Neck: Neck supple. Gross normal ROM               Cardiovascular: Normal rate and regular rhythm.                 Pulmonary/Chest: Effort normal and breath sounds without rales or wheezing.                Abd:  Soft, NT, ND, + BS, no organomegaly               Neurological: Pt is alert. At baseline orientation, motor grossly intact               Skin: Skin is warm. No rashes, no other new lesions, LE edema - none               Psychiatric: Pt behavior is normal without agitation , 2+ nervous  Micro: none  Cardiac tracings I have personally interpreted today:  none  Pertinent Radiological findings (summarize): none   Lab Results  Component Value Date   WBC 10.2 01/26/2021   HGB 15.4 01/26/2021   HCT 46.2 01/26/2021   PLT 298.0 01/26/2021   GLUCOSE 94 01/26/2021   CHOL 230 (H) 01/26/2021   TRIG 285.0 (H) 01/26/2021   HDL 29.60 (L) 01/26/2021   LDLDIRECT 173.0 01/26/2021   LDLCALC 143 (H) 08/13/2019   ALT 25 01/26/2021   AST 18 01/26/2021   NA 138 01/26/2021   K 4.0 01/26/2021   CL 98 01/26/2021   CREATININE 1.08 01/26/2021   BUN 20 01/26/2021   CO2 29 01/26/2021   TSH 1.07 01/26/2021   PSA 0.95 01/26/2021   HGBA1C 5.7 01/26/2021   Assessment/Plan:  Curtis [name] is a 46 y.o. White or Caucasian [1] male with  has a past medical history of ACNE, CYSTIC, Arthritis, IBS (irritable bowel syndrome), INGUINAL HERNIA, RIGHT (04/24/2008), Lactose intolerance, and PONV (  postoperative nausea and vomiting).  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 with diarrhea control - , stable,  to f/u any worsening symptoms or concerns, pt not willing to stop  Vitamin D deficiency Last vitamin  D Lab Results  Component Value Date   VD25OH 44 08/13/2019   Stable, cont oral replacement   Encounter for well adult exam with abnormal findings Age and sex appropriate education and counseling updated with regular exercise and diet Referrals for preventative services - none needed Immunizations addressed - declines flu shot Smoking counseling  - none needed Evidence for depression or other mood disorder - anxiety persistent, declines need for change in tx or referral Most recent labs reviewed. I have personally reviewed and have noted: 1) the patient's medical and social history 2) The patient's current medications and supplements 3) The patient's height, weight, and BMI have been recorded in the chart   Anxiety Chronic persistent it seems, pt declines need for change in tx or referral  B12 deficiency Lab Results  Component Value Date   VITAMINB12 1,057 (H) 01/26/2021   Stable, cont oral replacement - b12 1000 mcg qd   Cough I suspect related to allergy post nasal gtt or chronic cannabis use, pt requests pulm referral, cxr  Fasciculations of muscle Minor, benign, d/w pt - no specific tx needed  Hyperglycemia Lab Results  Component Value Date   HGBA1C 5.7 01/26/2021   Stable, pt to continue current medical treatment  - diet   IBS (irritable bowel syndrome) Stable, chronic,  to f/u any worsening symptoms or concerns  Followup: Return in about 1 year (around 01/26/2022).  Cathlean Cower, MD 01/29/2021 9:47 PM [facility]

## 2021-01-29 NOTE — Assessment & Plan Note (Signed)
Lab Results  Component Value Date   VITAMINB12 1,057 (H) 01/26/2021   Stable, cont oral replacement - b12 1000 mcg qd

## 2021-01-29 NOTE — Assessment & Plan Note (Signed)
Age and sex appropriate education and counseling updated with regular exercise and diet Referrals for preventative services - none needed Immunizations addressed - declines flu shot Smoking counseling  - none needed Evidence for depression or other mood disorder - anxiety persistent, declines need for change in tx or referral Most recent labs reviewed. I have personally reviewed and have noted: 1) the patient's medical and social history 2) The patient's current medications and supplements 3) The patient's height, weight, and BMI have been recorded in the chart

## 2021-01-29 NOTE — Assessment & Plan Note (Addendum)
I suspect related to allergy post nasal gtt or chronic cannabis use, pt requests pulm referral, cxr

## 2021-01-29 NOTE — Assessment & Plan Note (Signed)
Stable, chronic,  to f/u any worsening symptoms or concerns

## 2021-01-29 NOTE — Assessment & Plan Note (Signed)
Chronic persistent it seems, pt declines need for change in tx or referral

## 2021-01-29 NOTE — Assessment & Plan Note (Signed)
Lab Results  Component Value Date   HGBA1C 5.7 01/26/2021   Stable, pt to continue current medical treatment  - diet

## 2021-02-17 ENCOUNTER — Encounter: Payer: Self-pay | Admitting: Pulmonary Disease

## 2021-02-17 ENCOUNTER — Other Ambulatory Visit: Payer: Self-pay

## 2021-02-17 ENCOUNTER — Ambulatory Visit (INDEPENDENT_AMBULATORY_CARE_PROVIDER_SITE_OTHER): Payer: 59 | Admitting: Pulmonary Disease

## 2021-02-17 VITALS — BP 128/70 | HR 84 | Temp 98.9°F | Ht 73.0 in | Wt 180.0 lb

## 2021-02-17 DIAGNOSIS — R053 Chronic cough: Secondary | ICD-10-CM

## 2021-02-17 MED ORDER — FLUTICASONE FUROATE-VILANTEROL 200-25 MCG/ACT IN AEPB: 1.0000 | INHALATION_SPRAY | Freq: Every day | RESPIRATORY_TRACT | 6 refills | Status: DC

## 2021-02-17 NOTE — Patient Instructions (Addendum)
Nice to meet you  I think your cough could be related to several things.  However given the mucus production that is different from your nasal drainage, I worry about inflammation in the lungs being the major issue.  Other contributing factors could be reflux as well as drainage from the nasal passages.  To help treat this, use Breo 1 puff daily.  Rinse her mouth out after every use.  Consider taking it right before you brush your teeth.  If this is too expensive, ask the pharmacist what the cheaper option would be in the category.  If they are unable to help, please call us.  Return to clinic in 6 weeks or sooner as needed with Dr. Silas Flood

## 2021-02-22 NOTE — Progress Notes (Signed)
@Patient  ID: Curtis [name], male    DOB: [birth year], 46 y.o.   MRN: [id number]  Chief Complaint  Patient presents with   Consult    Consult for chronic cough. Pt states he smokes cannabis all the time. Pt states he has has a cough since last summer.     Referring provider: [name], MD  HPI:   46 y.o. man whom we are seeing in consultation for evaluation of chronic cough.  Most recent GI note reviewed.  Most recent PCP note reviewed.  Patient notes cough for long time.  At least a year, likely more.  Usually productive of phlegm.  Sometimes dark, foul tasting.  Seems worse in the evenings and mornings.  No position that makes things worse or better.  No seasonal environmental factors he can identify to make things better or worse.  No other alleviating or exacerbating factors.  He endorses a history of seasonal allergies.  Occasional nasal congestion.  However usually that congestion is of a different color and quality than his sputum he coughs up.  Also endorses heartburn.  Needs as needed antacids.  More recently using them more often over the last 1 to 2 months.  Reviewed most recent chest x-ray 01/2021 that on my review interpretation reveals clear lungs with evidence of hyperinflation both on the PA and lateral film.  Clear appearance with hyperinflation on my review and interpretation 06/21/2020 and 12/22/2014.   PMH: IBS Surgical history: Hernia repair Family history: Denies significant respiratory illness in first-degree relatives Social history: Does not smoke cigarettes, daily cannabis smoker, lives in [place] / Pulmonary Flowsheets:   ACT:  No flowsheet data found.  MMRC: No flowsheet data found.  Epworth:  No flowsheet data found.  Tests:   FENO:  No results found for: NITRICOXIDE  PFT: No flowsheet data found.  WALK:  No flowsheet data found.  Imaging: Personally reviewed DG Chest 2 View  Result Date: 01/27/2021 CLINICAL  DATA:  Persistent cough, right chest pain EXAM: CHEST - 2 VIEW COMPARISON:  06/24/2020 FINDINGS: Cardiac size is within normal limits. Increase in AP diameter of chest and low position of diaphragms suggests COPD. There are no signs of pulmonary edema or focal pulmonary consolidation. Lateral CP angles are not included in their entirety in the PA view. There is no pneumothorax. IMPRESSION: COPD. There are no signs of pulmonary edema or focal pulmonary consolidation. Electronically Signed   By: Curtis [name] M.D.   On: 01/27/2021 20:26    Lab Results: Personally reviewed CBC    Component Value Date/Time   WBC 10.2 01/26/2021 1406   RBC 5.26 01/26/2021 1406   HGB 15.4 01/26/2021 1406   HCT 46.2 01/26/2021 1406   PLT 298.0 01/26/2021 1406   MCV 87.7 01/26/2021 1406   MCH 29.6 06/24/2020 1456   MCHC 33.4 01/26/2021 1406   RDW 13.7 01/26/2021 1406   LYMPHSABS 1.7 01/26/2021 1406   MONOABS 1.0 01/26/2021 1406   EOSABS 0.0 01/26/2021 1406   BASOSABS 0.0 01/26/2021 1406    BMET    Component Value Date/Time   NA 138 01/26/2021 1406   K 4.0 01/26/2021 1406   CL 98 01/26/2021 1406   CO2 29 01/26/2021 1406   GLUCOSE 94 01/26/2021 1406   BUN 20 01/26/2021 1406   CREATININE 1.08 01/26/2021 1406   CREATININE 0.92 08/13/2019 0852   CALCIUM 9.8 01/26/2021 1406   GFRNONAA >60 06/24/2020 1456   GFRNONAA 100 08/13/2019 9937  GFRAA 116 08/13/2019 0852    BNP    Component Value Date/Time   BNP 19.0 06/24/2020 1456    ProBNP No results found for: PROBNP  Specialty Problems       Pulmonary Problems   Pleurisy    Qualifier: Diagnosis of  By: Asa Lente MD, Jannifer Rodney       Cough    Qualifier: Diagnosis of  By: Asa Lente MD, Valerie A       SOB (shortness of breath)   Allergic rhinitis   Spontaneous pneumothorax    At 46yo       Allergies  Allergen Reactions   Levofloxacin Other (See Comments)    GI problems    Immunization History  Administered Date(s)  Administered   Influenza Split 12/07/2010, 12/05/2011   Influenza,inj,Quad PF,6+ Mos 08/20/2013   Td 01/02/2006   Tdap 08/09/2018    Past Medical History:  Diagnosis Date   ACNE, CYSTIC    resolved   Arthritis    spine, early onset   IBS (irritable bowel syndrome)    INGUINAL HERNIA, RIGHT 04/24/2008   s/p RIH repair   Lactose intolerance    diet controlled   PONV (postoperative nausea and vomiting)     Tobacco History: Social History   Tobacco Use  Smoking Status Never  Smokeless Tobacco Never   Counseling given: Not Answered   Continue to not smoke  Outpatient Encounter Medications as of 02/17/2021  Medication Sig   fluticasone furoate-vilanterol (BREO ELLIPTA) 200-25 MCG/ACT AEPB Inhale 1 puff into the lungs daily.   mesalamine (LIALDA) 1.2 g EC tablet TAKE 2 TABLETS BY MOUTH TWICE A DAY   Probiotic Product (PROBIOTIC PO) Take 1 capsule by mouth daily.   [DISCONTINUED] hydrocortisone (ANUSOL-HC) 25 MG suppository Place 1 suppository (25 mg total) rectally at bedtime.   No facility-administered encounter medications on file as of 02/17/2021.     Review of Systems  Review of Systems  No chest pain with exertion.  No orthopnea or PND.  No lower extremity swelling.  Comprehensive review of systems otherwise negative. Physical Exam  BP 128/70 (BP Location: Left Arm, Patient Position: Sitting, Cuff Size: Normal)    Pulse 84    Temp 98.9 F (37.2 C) (Oral)    Ht 6' 1"  (1.854 m)    Wt 180 lb (81.6 kg)    SpO2 98%    BMI 23.75 kg/m   Wt Readings from Last 5 Encounters:  02/17/21 180 lb (81.6 kg)  01/26/21 177 lb 9.6 oz (80.6 kg)  10/06/20 183 lb 2 oz (83.1 kg)  06/24/20 180 lb (81.6 kg)  03/09/20 178 lb (80.7 kg)    BMI Readings from Last 5 Encounters:  02/17/21 23.75 kg/m  01/26/21 23.43 kg/m  10/06/20 24.16 kg/m  06/24/20 23.75 kg/m  03/09/20 24.14 kg/m     Physical Exam General: Well-appearing, no acute distress Eyes: EOMI, icterus Neck:  Supple, no JVP Pulmonary: Clear, normal work of breathing Cardiovascular: Regular in rhythm, no murmur Abdomen: Distended, bowel sounds present MSK: No synovitis, joint effusion Abdomen: Nondistended, bowel sounds present Neuro: Normal gait, no weakness Psych: Normal mood, full affect   Assessment & Plan:   Chronic cough: Present for many months to years.  Productive.  At risk for multiple etiologies including reflux given intermittent GERD symptoms worsened over the last month or 2, postnasal drip given description of nasal congestion and a sensation of mucus dripping the back of her throat.  High suspicion for chronic bronchitis related  to cannabis smoke as well as asthma given chronic hyperinflation on chest imaging.  Counseled to avoid smoking cannabis as able.  See below for asthma.  Asthma: Based on seasonal allergies, chronic hyperinflation on serial chest imaging over the years, chronic cough.  Trial of Breo daily.   Return in about 6 weeks (around 03/31/2021).   Lanier Clam, MD 02/22/2021

## 2021-03-05 ENCOUNTER — Other Ambulatory Visit: Payer: Self-pay | Admitting: Gastroenterology

## 2021-04-01 ENCOUNTER — Ambulatory Visit (INDEPENDENT_AMBULATORY_CARE_PROVIDER_SITE_OTHER): Payer: 59 | Admitting: Pulmonary Disease

## 2021-04-01 ENCOUNTER — Encounter: Payer: Self-pay | Admitting: Pulmonary Disease

## 2021-04-01 VITALS — BP 124/86 | HR 79 | Temp 98.6°F | Ht 73.0 in | Wt 180.4 lb

## 2021-04-01 DIAGNOSIS — R053 Chronic cough: Secondary | ICD-10-CM | POA: Diagnosis not present

## 2021-04-01 DIAGNOSIS — J4530 Mild persistent asthma, uncomplicated: Secondary | ICD-10-CM

## 2021-04-01 NOTE — Progress Notes (Signed)
@Patient  ID: Curtis [name], male    DOB: 06-Jul-1975, 46 y.o.   MRN: [id number]  Chief Complaint  Patient presents with   Follow-up    Follow up. Pt states that with the Breo there is no tension in is chest. Only been on breo for 16 days.     Referring provider: [name], MD  HPI:   46 y.o. man whom we are seeing in follow-up for evaluation of chronic cough, chest discomfort.    Some delay in starting Breo at last visit.  Is used for about 2 weeks.  Notes marked improvement in chest discomfort.  Cough does not seem to be a major issue.   HPI initial visit Patient notes cough for long time.  At least a year, likely more.  Usually productive of phlegm.  Sometimes dark, foul tasting.  Seems worse in the evenings and mornings.  No position that makes things worse or better.  No seasonal environmental factors he can identify to make things better or worse.  No other alleviating or exacerbating factors.  He endorses a history of seasonal allergies.  Occasional nasal congestion.  However usually that congestion is of a different color and quality than his sputum he coughs up.  Also endorses heartburn.  Needs as needed antacids.  More recently using them more often over the last 1 to 2 months.  Reviewed most recent chest x-ray 01/2021 that on my review interpretation reveals clear lungs with evidence of hyperinflation both on the PA and lateral film.  Clear appearance with hyperinflation on my review and interpretation 06/21/2020 and 12/22/2014.   PMH: IBS Surgical history: Hernia repair Family history: Denies significant respiratory illness in first-degree relatives Social history: Does not smoke cigarettes, daily cannabis smoker, lives in [place] / Pulmonary Flowsheets:   ACT:      No data to display          MMRC:     No data to display          Epworth:      No data to display          Tests:   FENO:  No results found for:  "NITRICOXIDE"  PFT:     No data to display          WALK:      No data to display          Imaging: Personally reviewed No results found.  Lab Results: Personally reviewed CBC    Component Value Date/Time   WBC 10.2 01/26/2021 1406   RBC 5.26 01/26/2021 1406   HGB 15.4 01/26/2021 1406   HCT 46.2 01/26/2021 1406   PLT 298.0 01/26/2021 1406   MCV 87.7 01/26/2021 1406   MCH 29.6 06/24/2020 1456   MCHC 33.4 01/26/2021 1406   RDW 13.7 01/26/2021 1406   LYMPHSABS 1.7 01/26/2021 1406   MONOABS 1.0 01/26/2021 1406   EOSABS 0.0 01/26/2021 1406   BASOSABS 0.0 01/26/2021 1406    BMET    Component Value Date/Time   NA 138 01/26/2021 1406   K 4.0 01/26/2021 1406   CL 98 01/26/2021 1406   CO2 29 01/26/2021 1406   GLUCOSE 94 01/26/2021 1406   BUN 20 01/26/2021 1406   CREATININE 1.08 01/26/2021 1406   CREATININE 0.92 08/13/2019 0852   CALCIUM 9.8 01/26/2021 1406   GFRNONAA >60 06/24/2020 1456   GFRNONAA 100 08/13/2019 0852   GFRAA 116 08/13/2019 0852    BNP  Component Value Date/Time   BNP 19.0 06/24/2020 1456    ProBNP No results found for: "PROBNP"  Specialty Problems       Pulmonary Problems   Pleurisy    Qualifier: Diagnosis of  By: Asa Lente MD, Jannifer Rodney       Cough    Qualifier: Diagnosis of  By: Asa Lente MD, Valerie A       SOB (shortness of breath)   Allergic rhinitis   Spontaneous pneumothorax    At 46yo       Allergies  Allergen Reactions   Levofloxacin Other (See Comments)    GI problems    Immunization History  Administered Date(s) Administered   Influenza Split 12/07/2010, 12/05/2011   Influenza,inj,Quad PF,6+ Mos 08/20/2013   Td 01/02/2006   Tdap 08/09/2018    Past Medical History:  Diagnosis Date   ACNE, CYSTIC    resolved   Arthritis    spine, early onset   IBS (irritable bowel syndrome)    INGUINAL HERNIA, RIGHT 04/24/2008   s/p RIH repair   Lactose intolerance    diet controlled   PONV  (postoperative nausea and vomiting)     Tobacco History: Social History   Tobacco Use  Smoking Status Never  Smokeless Tobacco Never   Counseling given: Not Answered   Continue to not smoke  Outpatient Encounter Medications as of 04/01/2021  Medication Sig   fluticasone furoate-vilanterol (BREO ELLIPTA) 200-25 MCG/ACT AEPB Inhale 1 puff into the lungs daily.   mesalamine (LIALDA) 1.2 g EC tablet TAKE 2 TABLETS BY MOUTH TWICE A DAY   Probiotic Product (PROBIOTIC PO) Take 1 capsule by mouth daily.   No facility-administered encounter medications on file as of 04/01/2021.     Review of Systems  Review of Systems  N/a Physical Exam  BP 124/86 (BP Location: Left Arm, Patient Position: Sitting, Cuff Size: Normal)   Pulse 79   Temp 98.6 F (37 C) (Oral)   Ht 6' 1"  (1.854 m)   Wt 180 lb 6.4 oz (81.8 kg)   SpO2 99%   BMI 23.80 kg/m   Wt Readings from Last 5 Encounters:  04/01/21 180 lb 6.4 oz (81.8 kg)  02/17/21 180 lb (81.6 kg)  01/26/21 177 lb 9.6 oz (80.6 kg)  10/06/20 183 lb 2 oz (83.1 kg)  06/24/20 180 lb (81.6 kg)    BMI Readings from Last 5 Encounters:  04/01/21 23.80 kg/m  02/17/21 23.75 kg/m  01/26/21 23.43 kg/m  10/06/20 24.16 kg/m  06/24/20 23.75 kg/m     Physical Exam General: Well-appearing, no acute distress Eyes: EOMI, icterus Neck: Supple, no JVP Pulmonary: Clear, normal work of breathing Cardiovascular: Regular in rhythm, no murmur Abdomen: Distended, bowel sounds present MSK: No synovitis, joint effusion Abdomen: Nondistended, bowel sounds present Neuro: Normal gait, no weakness Psych: Normal mood, full affect   Assessment & Plan:   Chronic cough: Present for many months to years.  Productive.  At risk for multiple etiologies including reflux given intermittent GERD symptoms worsened over the last month or 2, postnasal drip given description of nasal congestion and a sensation of mucus dripping the back of her throat.  High  suspicion for chronic bronchitis related to cannabis smoke as well as asthma given chronic hyperinflation on chest imaging.  Seems improved with Breo.  Asthma: Based on seasonal allergies, chronic hyperinflation on serial chest imaging over the years, chronic cough.  Symptoms improved with Breo.  Refills provided today   Return in about 5 months (  around 09/01/2021).   Lanier Clam, MD 06/12/2021

## 2021-04-01 NOTE — Patient Instructions (Addendum)
Nice to see you again ? ?We will continue Breo or a similar inhaler - I will message pharmacy team to see what more cost effective options there may be - if we need to change I will let you know ? ?Return to clinic in 5 months or sooner as needed with Dr. Silas Flood ?

## 2021-05-19 ENCOUNTER — Telehealth: Payer: Self-pay | Admitting: Pulmonary Disease

## 2021-05-19 MED ORDER — FLUCONAZOLE 200 MG PO TABS: 200.0000 mg | ORAL_TABLET | Freq: Every day | ORAL | 0 refills | Status: AC

## 2021-05-19 NOTE — Telephone Encounter (Signed)
Called and spoke with patient. He stated that he believes he has thrush from the [facility] inhaler. He has been rinsing his mouth out with mouthwash as well as brushing his teeth after using the Breo inhaler. Last Thursday and Friday he noticed small white patches on his tongue. He also noticed that his throat was sore. He did feel nauseous on Saturday and Sunday but he does not today. He still has the white patches on his tongue.   He wanted to know if anything could be called in to help with the thrush. Pharmacy is [facility] on [address].   Dr. Verlee Monte, can you please advise since Dr. Silas Flood is off today? Thanks!

## 2021-07-08 ENCOUNTER — Encounter: Payer: Self-pay | Admitting: Pulmonary Disease

## 2021-07-08 ENCOUNTER — Ambulatory Visit (INDEPENDENT_AMBULATORY_CARE_PROVIDER_SITE_OTHER): Payer: 59 | Admitting: Pulmonary Disease

## 2021-07-08 VITALS — BP 130/82 | HR 80 | Temp 97.8°F | Ht 73.0 in | Wt 174.4 lb

## 2021-07-08 DIAGNOSIS — J309 Allergic rhinitis, unspecified: Secondary | ICD-10-CM

## 2021-07-08 DIAGNOSIS — R053 Chronic cough: Secondary | ICD-10-CM | POA: Diagnosis not present

## 2021-07-08 NOTE — Progress Notes (Signed)
@Patient  ID: Curtis [name], male    DOB: 02-23-1975, 46 y.o.   MRN: [id number]  Chief Complaint  Patient presents with   Follow-up    Pt states he has been good. Pt states he is still clearing his throat and bringing up brownish/ yellowish sputum.    Referring provider: [name], MD  HPI:   46 y.o. man whom we are seeing in follow-up for evaluation of chronic cough, chest discomfort.    Continues Breo.  Continued improvement in sensation of the deep breath, chest discomfort.  Cough is markedly improved.  Still with cough mainly in the morning productive of sputum.  Overall improved as discussed.  He has missed Breo for a few days at a time.  There is no difference in symptoms on days he is missed Breo.  Discussed de-escalation.  He endorses a lot of sinus congestion, postnasal drip particular in the morning cough.   HPI initial visit Patient notes cough for long time.  At least a year, likely more.  Usually productive of phlegm.  Sometimes dark, foul tasting.  Seems worse in the evenings and mornings.  No position that makes things worse or better.  No seasonal environmental factors he can identify to make things better or worse.  No other alleviating or exacerbating factors.  He endorses a history of seasonal allergies.  Occasional nasal congestion.  However usually that congestion is of a different color and quality than his sputum he coughs up.  Also endorses heartburn.  Needs as needed antacids.  More recently using them more often over the last 1 to 2 months.  Reviewed most recent chest x-ray 01/2021 that on my review interpretation reveals clear lungs with evidence of hyperinflation both on the PA and lateral film.  Clear appearance with hyperinflation on my review and interpretation 06/21/2020 and 12/22/2014.   PMH: IBS Surgical history: Hernia repair Family history: Denies significant respiratory illness in first-degree relatives Social history: Does not smoke  cigarettes, daily cannabis smoker, lives in [place] / Pulmonary Flowsheets:   ACT:      No data to display           MMRC:     No data to display           Epworth:      No data to display           Tests:   FENO:  No results found for: "NITRICOXIDE"  PFT:     No data to display           WALK:      No data to display           Imaging: Personally reviewed No results found.  Lab Results: Personally reviewed CBC    Component Value Date/Time   WBC 10.2 01/26/2021 1406   RBC 5.26 01/26/2021 1406   HGB 15.4 01/26/2021 1406   HCT 46.2 01/26/2021 1406   PLT 298.0 01/26/2021 1406   MCV 87.7 01/26/2021 1406   MCH 29.6 06/24/2020 1456   MCHC 33.4 01/26/2021 1406   RDW 13.7 01/26/2021 1406   LYMPHSABS 1.7 01/26/2021 1406   MONOABS 1.0 01/26/2021 1406   EOSABS 0.0 01/26/2021 1406   BASOSABS 0.0 01/26/2021 1406    BMET    Component Value Date/Time   NA 138 01/26/2021 1406   K 4.0 01/26/2021 1406   CL 98 01/26/2021 1406   CO2 29 01/26/2021 1406   GLUCOSE 94 01/26/2021  1406   BUN 20 01/26/2021 1406   CREATININE 1.08 01/26/2021 1406   CREATININE 0.92 08/13/2019 0852   CALCIUM 9.8 01/26/2021 1406   GFRNONAA >60 06/24/2020 1456   GFRNONAA 100 08/13/2019 0852   GFRAA 116 08/13/2019 0852    BNP    Component Value Date/Time   BNP 19.0 06/24/2020 1456    ProBNP No results found for: "PROBNP"  Specialty Problems       Pulmonary Problems   Pleurisy    Qualifier: Diagnosis of  By: Asa Lente MD, Jannifer Rodney       Cough    Qualifier: Diagnosis of  By: Asa Lente MD, Valerie A       SOB (shortness of breath)   Allergic rhinitis   Spontaneous pneumothorax    At 46yo       Allergies  Allergen Reactions   Levofloxacin Other (See Comments)    GI problems    Immunization History  Administered Date(s) Administered   Influenza Split 12/07/2010, 12/05/2011   Influenza,inj,Quad PF,6+ Mos 08/20/2013   Td  01/02/2006   Tdap 08/09/2018    Past Medical History:  Diagnosis Date   ACNE, CYSTIC    resolved   Arthritis    spine, early onset   IBS (irritable bowel syndrome)    INGUINAL HERNIA, RIGHT 04/24/2008   s/p RIH repair   Lactose intolerance    diet controlled   PONV (postoperative nausea and vomiting)     Tobacco History: Social History   Tobacco Use  Smoking Status Never   Passive exposure: Never  Smokeless Tobacco Never   Counseling given: Not Answered   Continue to not smoke  Outpatient Encounter Medications as of 07/08/2021  Medication Sig   fluticasone furoate-vilanterol (BREO ELLIPTA) 200-25 MCG/ACT AEPB Inhale 1 puff into the lungs daily.   mesalamine (LIALDA) 1.2 g EC tablet TAKE 2 TABLETS BY MOUTH TWICE A DAY   Probiotic Product (PROBIOTIC PO) Take 1 capsule by mouth daily.   No facility-administered encounter medications on file as of 07/08/2021.     Review of Systems  Review of Systems  N/a Physical Exam  BP 130/82 (BP Location: Right Arm, Patient Position: Sitting, Cuff Size: Normal)   Pulse 80   Temp 97.8 F (36.6 C) (Oral)   Ht 6' 1"  (1.854 m)   Wt 174 lb 6.4 oz (79.1 kg)   SpO2 97%   BMI 23.01 kg/m   Wt Readings from Last 5 Encounters:  07/08/21 174 lb 6.4 oz (79.1 kg)  04/01/21 180 lb 6.4 oz (81.8 kg)  02/17/21 180 lb (81.6 kg)  01/26/21 177 lb 9.6 oz (80.6 kg)  10/06/20 183 lb 2 oz (83.1 kg)    BMI Readings from Last 5 Encounters:  07/08/21 23.01 kg/m  04/01/21 23.80 kg/m  02/17/21 23.75 kg/m  01/26/21 23.43 kg/m  10/06/20 24.16 kg/m     Physical Exam General: Well-appearing, no acute distress Eyes: EOMI, icterus Neck: Supple, no JVP Pulmonary: Clear, normal work of breathing Cardiovascular: Regular in rhythm, no murmur Abdomen: Distended, bowel sounds present MSK: No synovitis, joint effusion Abdomen: Nondistended, bowel sounds present Neuro: Normal gait, no weakness Psych: Normal mood, full affect   Assessment &  Plan:   Chronic cough: Present for many months to years.  Productive.  At risk for multiple etiologies including reflux given intermittent GERD symptoms worsened over the last month or 2, postnasal drip given description of nasal congestion and a sensation of mucus dripping the back of her throat.  High suspicion for chronic bronchitis related to cannabis smoke as well as asthma given chronic hyperinflation on chest imaging.  Seems improved with Breo.  Asthma: Based on seasonal allergies, chronic hyperinflation on serial chest imaging over the years, chronic cough.  Symptoms improved with Breo.  Given symptoms unchanged and days he is missed, okay to stop or see response.  Postnasal drip: Possible contributing to symptoms.  If worsens recommend use of Flonase 2 sprays each nostril twice daily for a week then decrease to daily.  Afrin 2 sprays each nostril x3 days then stop.  If symptoms persist add sinus rinses.  Consider ENT referral in the future if not improving with these measures.   Return in about 1 year (around 07/09/2022).   Lanier Clam, MD 07/08/2021

## 2021-07-08 NOTE — Patient Instructions (Signed)
Nice to see you again  It is okay to stop the [place], I agree with getting 1 more refill just in case you need to resume it in the coming weeks  If the cough and postnasal drip gets bad enough I recommend the following:  1) Flonase 2 sprays each nostril twice a day for 1 week then decrease to 2 sprays each nostril once a day  2) Afrin 2 sprays each nostril twice a day for 3 days then stop  3) if the above is not getting the job done an additional aggressive step would be to add sinus rinses/Nettie pot.  You can get sinus rinse bottles on the cough and cold aisle in the drugstore.  You need to use distilled or purified water and mix with salt packets that are next to the bottles and the same I will.  Do this twice a day for a week then once daily.  The idea is that the salt gets into the sinuses and then pulls in water throughout the day to thin out the mucus and help at all drain.  Return to clinic in 1 year or sooner as needed with Dr. Silas Flood

## 2021-08-17 ENCOUNTER — Encounter: Payer: Self-pay | Admitting: Gastroenterology

## 2021-08-19 ENCOUNTER — Telehealth: Payer: Self-pay | Admitting: Gastroenterology

## 2021-08-19 NOTE — Telephone Encounter (Signed)
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 Please advise. Thank you

## 2021-08-19 NOTE — Telephone Encounter (Signed)
Error

## 2021-08-19 NOTE — Telephone Encounter (Signed)
Patient called in to follow up on patient message encounter on 08/17/21. Curtis [name] states Curtis [name] recently started a new job 1 month ago that required a drug screen up front, and Curtis [name] was honest with his employer about his use of marijuana along with his prescribed medications for colitis. HR is requiring proof of colitis & that MD is aware of marijuana use for this diagnosis. Curtis [name] is aware Dr. Fuller Plan will need to review when Curtis [name] returns to office.

## 2021-08-25 ENCOUNTER — Ambulatory Visit: Payer: 59 | Admitting: Pulmonary Disease

## 2021-09-15 ENCOUNTER — Other Ambulatory Visit: Payer: Self-pay | Admitting: Gastroenterology

## 2021-11-21 ENCOUNTER — Other Ambulatory Visit: Payer: Self-pay | Admitting: Gastroenterology

## 2021-11-21 ENCOUNTER — Telehealth: Payer: Self-pay | Admitting: *Deleted

## 2021-11-21 ENCOUNTER — Other Ambulatory Visit: Payer: Self-pay | Admitting: Pulmonary Disease

## 2021-11-21 MED ORDER — FLUTICASONE FUROATE-VILANTEROL 200-25 MCG/ACT IN AEPB: 1.0000 | INHALATION_SPRAY | Freq: Every day | RESPIRATORY_TRACT | 6 refills | Status: DC

## 2021-11-21 NOTE — Telephone Encounter (Signed)
Patient called and would like to refill a prescription of Breo.  Patient uses [facility] and would like a call back when script is in. [phone]

## 2021-11-21 NOTE — Telephone Encounter (Signed)
Called and spoke to patient and advised him that I was sending in his refills of Breo as he requested. Nothing further needed

## 2021-11-22 ENCOUNTER — Other Ambulatory Visit (HOSPITAL_COMMUNITY): Payer: Self-pay

## 2021-11-22 NOTE — Telephone Encounter (Signed)
Per test claim Curtis [name] is covered, called patients pharmacy and medication was re-processed for correct product.

## 2021-12-24 ENCOUNTER — Other Ambulatory Visit: Payer: Self-pay | Admitting: Gastroenterology

## 2021-12-28 ENCOUNTER — Other Ambulatory Visit: Payer: Self-pay | Admitting: Gastroenterology

## 2022-01-12 ENCOUNTER — Other Ambulatory Visit: Payer: Self-pay | Admitting: Gastroenterology

## 2022-02-21 ENCOUNTER — Other Ambulatory Visit: Payer: Self-pay | Admitting: Gastroenterology

## 2022-02-28 ENCOUNTER — Encounter: Payer: Self-pay | Admitting: Gastroenterology

## 2022-02-28 ENCOUNTER — Ambulatory Visit (INDEPENDENT_AMBULATORY_CARE_PROVIDER_SITE_OTHER): Payer: 59 | Admitting: Gastroenterology

## 2022-02-28 VITALS — BP 120/86 | HR 75 | Ht 73.0 in | Wt 172.0 lb

## 2022-02-28 DIAGNOSIS — K580 Irritable bowel syndrome with diarrhea: Secondary | ICD-10-CM

## 2022-02-28 DIAGNOSIS — K5130 Ulcerative (chronic) rectosigmoiditis without complications: Secondary | ICD-10-CM | POA: Diagnosis not present

## 2022-02-28 MED ORDER — MESALAMINE 1.2 G PO TBEC: DELAYED_RELEASE_TABLET | ORAL | 3 refills | Status: DC

## 2022-02-28 NOTE — Progress Notes (Signed)
    Assessment     Ulcerative proctosigmoiditis IBS-D Health-related anxiety   Recommendations    Continue Lialda 2.4 g twice daily Continue current diet Colonoscopy recommended in May 2028   HPI    This is a 47 year old male with ulcerative proctosigmoiditis, IBS-D and hemorrhoids.  His last office visit was in October 2022.  He has made substantial adjustments in his diet to avoid processed food, decrease red meat intake and eliminate lactose products.  This has resulted in substantially improved control of his diarrhea.  He states he has diarrhea in the mornings and none the rest of the day.  His hemorrhoids are occasionally symptomatic with discomfort and infrequent episodes of prolapse.     Labs / Imaging       Latest Ref Rng & Units 01/26/2021    2:06 PM 06/24/2020    2:56 PM 08/13/2019    8:52 AM  Hepatic Function  Total Protein 6.0 - 8.3 g/dL 7.8  7.2  7.0   Albumin 3.5 - 5.2 g/dL 5.0  4.7    AST 0 - 37 U/L 18  16  27   $ ALT 0 - 53 U/L 25  23  57   Alk Phosphatase 39 - 117 U/L 87  70    Total Bilirubin 0.2 - 1.2 mg/dL 0.4  0.5  0.7   Bilirubin, Direct 0.0 - 0.3 mg/dL 0.1          Latest Ref Rng & Units 01/26/2021    2:06 PM 06/24/2020    2:56 PM 08/13/2019    8:53 AM  CBC  WBC 4.0 - 10.5 K/uL 10.2  10.1  7.2   Hemoglobin 13.0 - 17.0 g/dL 15.4  15.3  15.2   Hematocrit 39.0 - 52.0 % 46.2  44.6  45.2   Platelets 150.0 - 400.0 K/uL 298.0  267  250    Current Medications, Allergies, Past Medical History, Past Surgical History, Family History and Social History were reviewed in [employer] record.   Physical Exam: General: Well developed, well nourished, no acute distress Head: Normocephalic and atraumatic Eyes: Sclerae anicteric, EOMI Ears: Normal auditory acuity Mouth: No deformities or lesions noted Lungs: Clear throughout to auscultation Heart: Regular rate and rhythm; No murmurs, rubs or bruits Abdomen: Soft, non tender and non  distended. No masses, hepatosplenomegaly or hernias noted. Normal Bowel sounds Rectal: Not done Musculoskeletal: Symmetrical with no gross deformities  Pulses:  Normal pulses noted Extremities: No edema or deformities noted Neurological: Alert oriented x 4, grossly nonfocal Psychological:  Alert and cooperative. Normal mood and affect   Caedence Snowden T. Fuller Plan, MD 02/28/2022, 8:35 AM

## 2022-02-28 NOTE — Patient Instructions (Signed)
We have sent the following medications to your pharmacy for you to pick up at your convenience: Lialda.  ° °The [facility] providers would like to encourage you to use MYCHART to communicate with providers for non-urgent requests or questions.  Due to long hold times on the telephone, sending your provider a message by MYCHART may be a faster and more efficient way to get a response.  Please allow 48 business hours for a response.  Please remember that this is for non-urgent requests.  ° °Thank you for choosing me and  Gastroenterology. ° °Malcolm T. Stark, Jr., MD., FACG ° °

## 2022-03-05 ENCOUNTER — Other Ambulatory Visit: Payer: Self-pay | Admitting: Gastroenterology

## 2022-03-15 NOTE — Progress Notes (Unsigned)
Synopsis: Referred for cough by [name], MD  Subjective:   PATIENT ID: Curtis [name] GENDER: male DOB: 04/24/1975, MRN: [id number]  No chief complaint on file.  47yM with history of chronic cough, suspected asthma f/b Dr. Silas Flood, PND seen for congestion  Otherwise pertinent review of systems is negative.  Past Medical History:  Diagnosis Date   ACNE, CYSTIC    resolved   Arthritis    spine, early onset   IBS (irritable bowel syndrome)    INGUINAL HERNIA, RIGHT 04/24/2008   s/p RIH repair   Lactose intolerance    diet controlled   PONV (postoperative nausea and vomiting)      Family History  Problem Relation Age of Onset   Urolithiasis Father        Had kidney removed at age 49   Stroke Maternal Grandmother 75   Colon cancer Neg Hx    Esophageal cancer Neg Hx    Pancreatic cancer Neg Hx    Prostate cancer Neg Hx    Rectal cancer Neg Hx    Stomach cancer Neg Hx      Past Surgical History:  Procedure Laterality Date   COLONOSCOPY     HERNIA REPAIR Right 2010   Dr. Rob Bunting HERNIA REPAIR Bilateral 03/14/2018   Procedure: [place];  Surgeon: Michael Boston, MD;  Location: [place];  Service: General;  Laterality: Bilateral;   Surgery for spont pneumothorax Right [id number]   UMBILICAL HERNIA REPAIR N/A 03/14/2018   Procedure: HERNIA REPAIR UMBILICAL ADULT;  Surgeon: Michael Boston, MD;  Location: [place];  Service: General;  Laterality: N/A;   WISDOM TOOTH EXTRACTION      Social History   Socioeconomic History   Marital status: Single    Spouse name: Not on file   Number of children: 0   Years of education: 15   Highest education level: Not on file  Occupational History   Occupation: copy room    Employer: [employer]  Tobacco Use   Smoking status: Never    Passive exposure: Never   Smokeless tobacco: Never  Vaping Use   Vaping Use: Former   Substance and Sexual Activity   Alcohol use: Yes    Alcohol/week: 3.0 standard drinks of alcohol    Types: 3 Cans of beer per week    Comment: Occasinal   Drug use: Yes    Types: Marijuana    Comment: 05-11-16   Sexual activity: Not on file  Other Topics Concern   Not on file  Social History Narrative   Fun: Play basketball,golf, binge watching    Social Determinants of Health   Financial Resource Strain: Not on file  Food Insecurity: Not on file  Transportation Needs: Not on file  Physical Activity: Not on file  Stress: Not on file  Social Connections: Not on file  Intimate Partner Violence: Not on file     Allergies  Allergen Reactions   Levofloxacin Other (See Comments)    GI problems     Outpatient Medications Prior to Visit  Medication Sig Dispense Refill   fluticasone furoate-vilanterol (BREO ELLIPTA) 200-25 MCG/ACT AEPB Inhale 1 puff into the lungs daily. 60 each 6   mesalamine (LIALDA) 1.2 g EC tablet TAKE 2 TABLETS BY MOUTH TWICE A DAY 360 tablet 3   Probiotic Product (PROBIOTIC PO) Take 1 capsule by mouth daily.     No  facility-administered medications prior to visit.       Objective:   Physical Exam:  General appearance: 47 y.o., male, NAD, conversant  Eyes: anicteric sclerae; PERRL, tracking appropriately HENT: NCAT; MMM Neck: Trachea midline; no lymphadenopathy, no JVD Lungs: CTAB, no crackles, no wheeze, with normal respiratory effort CV: RRR, no murmur  Abdomen: Soft, non-tender; non-distended, BS present  Extremities: No peripheral edema, warm Skin: Normal turgor and texture; no rash Psych: Appropriate affect Neuro: Alert and oriented to person and place, no focal deficit     There were no vitals filed for this visit.   on *** LPM *** RA BMI Readings from Last 3 Encounters:  02/28/22 22.69 kg/m  07/08/21 23.01 kg/m  04/01/21 23.80 kg/m   Wt Readings from Last 3 Encounters:  02/28/22 172 lb (78 kg)  07/08/21 174 lb 6.4 oz (79.1  kg)  04/01/21 180 lb 6.4 oz (81.8 kg)     CBC    Component Value Date/Time   WBC 10.2 01/26/2021 1406   RBC 5.26 01/26/2021 1406   HGB 15.4 01/26/2021 1406   HCT 46.2 01/26/2021 1406   PLT 298.0 01/26/2021 1406   MCV 87.7 01/26/2021 1406   MCH 29.6 06/24/2020 1456   MCHC 33.4 01/26/2021 1406   RDW 13.7 01/26/2021 1406   LYMPHSABS 1.7 01/26/2021 1406   MONOABS 1.0 01/26/2021 1406   EOSABS 0.0 01/26/2021 1406   BASOSABS 0.0 01/26/2021 1406    Eos 100-200 historically  Chest Imaging: CXR 01/26/21 with borderline hyperinflation  Pulmonary Functions Testing Results:     No data to display           No PFT    Assessment & Plan:    Plan:      Maryjane Hurter, MD [facility] 03/15/2022 6:40 PM

## 2022-03-16 ENCOUNTER — Ambulatory Visit (INDEPENDENT_AMBULATORY_CARE_PROVIDER_SITE_OTHER): Payer: 59 | Admitting: Student

## 2022-03-16 ENCOUNTER — Ambulatory Visit (INDEPENDENT_AMBULATORY_CARE_PROVIDER_SITE_OTHER): Payer: 59

## 2022-03-16 ENCOUNTER — Encounter: Payer: Self-pay | Admitting: Student

## 2022-03-16 VITALS — BP 126/74 | HR 77 | Temp 98.2°F | Ht 73.0 in | Wt 170.6 lb

## 2022-03-16 DIAGNOSIS — R053 Chronic cough: Secondary | ICD-10-CM | POA: Diagnosis not present

## 2022-03-16 MED ORDER — AZITHROMYCIN 250 MG PO TABS: ORAL_TABLET | ORAL | 0 refills | Status: DC

## 2022-03-16 MED ORDER — FLUTICASONE PROPIONATE 50 MCG/ACT NA SUSP: 1.0000 | Freq: Every day | NASAL | 11 refills | Status: DC

## 2022-03-16 MED ORDER — PREDNISONE 10 MG PO TABS: ORAL_TABLET | ORAL | 0 refills | Status: DC

## 2022-03-16 NOTE — Patient Instructions (Addendum)
-   you'll be called to schedule ct chest, please call our office once you've had it done - take shower, clear nose of crusting then flonase 1-2 spray each nostril daily for at least 3 weeks  - mucinex 600 mg-1200 mg twice daily to thin mucus and make it easier to expectorate - breo 1 puff once daily then flutter valve 10 slow but firm puffs twice daily  - if failing to improve over next week or so, still have productive cough and wheeze then ok to try prednisone taper, azithromycin

## 2022-03-22 ENCOUNTER — Ambulatory Visit (HOSPITAL_COMMUNITY): Payer: 59

## 2022-04-12 ENCOUNTER — Other Ambulatory Visit: Payer: 59

## 2022-05-12 ENCOUNTER — Ambulatory Visit (INDEPENDENT_AMBULATORY_CARE_PROVIDER_SITE_OTHER): Payer: 59 | Admitting: Internal Medicine

## 2022-05-12 ENCOUNTER — Encounter: Payer: Self-pay | Admitting: Internal Medicine

## 2022-05-12 VITALS — BP 120/74 | HR 70 | Temp 97.8°F | Ht 73.0 in | Wt 169.0 lb

## 2022-05-12 DIAGNOSIS — E538 Deficiency of other specified B group vitamins: Secondary | ICD-10-CM | POA: Diagnosis not present

## 2022-05-12 DIAGNOSIS — E7800 Pure hypercholesterolemia, unspecified: Secondary | ICD-10-CM

## 2022-05-12 DIAGNOSIS — N452 Orchitis: Secondary | ICD-10-CM | POA: Diagnosis not present

## 2022-05-12 DIAGNOSIS — E559 Vitamin D deficiency, unspecified: Secondary | ICD-10-CM | POA: Diagnosis not present

## 2022-05-12 DIAGNOSIS — Z125 Encounter for screening for malignant neoplasm of prostate: Secondary | ICD-10-CM

## 2022-05-12 DIAGNOSIS — R739 Hyperglycemia, unspecified: Secondary | ICD-10-CM

## 2022-05-12 DIAGNOSIS — Z202 Contact with and (suspected) exposure to infections with a predominantly sexual mode of transmission: Secondary | ICD-10-CM | POA: Insufficient documentation

## 2022-05-12 LAB — URINALYSIS, ROUTINE W REFLEX MICROSCOPIC
Bilirubin Urine: NEGATIVE
Ketones, ur: NEGATIVE
Leukocytes,Ua: NEGATIVE
Nitrite: NEGATIVE
Specific Gravity, Urine: 1.01 (ref 1.000–1.030)
Total Protein, Urine: NEGATIVE
Urine Glucose: NEGATIVE
Urobilinogen, UA: 0.2 (ref 0.0–1.0)
pH: 6 (ref 5.0–8.0)

## 2022-05-12 MED ORDER — DOXYCYCLINE HYCLATE 100 MG PO TABS: 100.0000 mg | ORAL_TABLET | Freq: Two times a day (BID) | ORAL | 0 refills | Status: DC

## 2022-05-12 NOTE — Progress Notes (Unsigned)
Patient ID: Curtis [name], male   DOB: Jun 23, 1975, 47 y.o.   MRN: [id number]        Chief Complaint: follow up left orchitis, low b12, hld, STD exposure, hyperglycemia       HPI:  Curtis [name] is a 47 y.o. male here with 3 days onset pain, swelling left testicle without fever chills and Denies urinary symptoms such as dysuria, frequency, urgency, flank pain, hematuria or n/v, fever, chills.  Has hx of unprotected intercourse, asking for STD labs.  Has not been working on low chol diet recently.  Taking B12 a few days a wk at most.  Not taking Vit D       Wt Readings from Last 3 Encounters:  05/12/22 169 lb (76.7 kg)  03/16/22 170 lb 9.6 oz (77.4 kg)  02/28/22 172 lb (78 kg)   BP Readings from Last 3 Encounters:  05/12/22 120/74  03/16/22 126/74  02/28/22 120/86         Past Medical History:  Diagnosis Date   ACNE, CYSTIC    resolved   Arthritis    spine, early onset   IBS (irritable bowel syndrome)    INGUINAL HERNIA, RIGHT 04/24/2008   s/p RIH repair   Lactose intolerance    diet controlled   PONV (postoperative nausea and vomiting)    Past Surgical History:  Procedure Laterality Date   COLONOSCOPY     HERNIA REPAIR Right 2010   Dr. Rich Brave HERNIA REPAIR Bilateral 03/14/2018   Procedure: LAPAROSCOPIC BILATERAL  INGUINAL HERNIA REPAIR  WITH MESH ERAS PAHTWAY;  Surgeon: Karie Soda, MD;  Location: [facility];  Service: General;  Laterality: Bilateral;   Surgery for spont pneumothorax Right 1996   UMBILICAL HERNIA REPAIR N/A 03/14/2018   Procedure: HERNIA REPAIR UMBILICAL ADULT;  Surgeon: Karie Soda, MD;  Location: [facility];  Service: General;  Laterality: N/A;   WISDOM TOOTH EXTRACTION      reports that he has never smoked. He has never been exposed to tobacco smoke. He has never used smokeless tobacco. He reports current alcohol use of about 3.0 standard drinks of alcohol per week. He reports current drug use.  Drug: Marijuana. family history includes Stroke (age of onset: 57) in his maternal grandmother; Urolithiasis in his father. Allergies  Allergen Reactions   Levofloxacin Other (See Comments)    GI problems   Current Outpatient Medications on File Prior to Visit  Medication Sig Dispense Refill   azithromycin (ZITHROMAX) 250 MG tablet 2 tablets on day 1, then 1 tablet daily till gone 6 tablet 0   fluticasone (FLONASE) 50 MCG/ACT nasal spray Place 1 spray into both nostrils daily. 16 g 11   fluticasone furoate-vilanterol (BREO ELLIPTA) 200-25 MCG/ACT AEPB Inhale 1 puff into the lungs daily. 60 each 6   mesalamine (LIALDA) 1.2 g EC tablet TAKE 2 TABLETS BY MOUTH TWICE A DAY 360 tablet 3   predniSONE (DELTASONE) 10 MG tablet Take 4 tabs for 2 days, then 3 tabs for 2 days, 2 tabs for 2 days, then 1 tab for 2 days, then stop. 20 tablet 0   Probiotic Product (PROBIOTIC PO) Take 1 capsule by mouth daily.     No current facility-administered medications on file prior to visit.        ROS:  All others reviewed and negative.  Objective        PE:  BP 120/74 (BP Location: Left Arm, Patient Position:  Sitting, Cuff Size: Normal)   Pulse 70   Temp 97.8 F (36.6 C) (Oral)   Ht 6\' 1"  (1.854 m)   Wt 169 lb (76.7 kg)   SpO2 98%   BMI 22.30 kg/m                 Constitutional: Pt appears in NAD               HENT: Head: NCAT.                Right Ear: External ear normal.                 Left Ear: External ear normal.                Eyes: . Pupils are equal, round, and reactive to light. Conjunctivae and EOM are normal               Nose: without d/c or deformity               Neck: Neck supple. Gross normal ROM               Cardiovascular: Normal rate and regular rhythm.                 Pulmonary/Chest: Effort normal and breath sounds without rales or wheezing.                Abd:  Soft, NT, ND, + BS, no organomegaly               GU;  left testicle 2-3+ tender swelling                Neurological: Pt is alert. At baseline orientation, motor grossly intact               Skin: Skin is warm. No rashes, no other new lesions, LE edema -none               Psychiatric: Pt behavior is normal without agitation   Micro: none  Cardiac tracings I have personally interpreted today:  none  Pertinent Radiological findings (summarize): none   Lab Results  Component Value Date   WBC 10.2 01/26/2021   HGB 15.4 01/26/2021   HCT 46.2 01/26/2021   PLT 298.0 01/26/2021   GLUCOSE 94 01/26/2021   CHOL 230 (H) 01/26/2021   TRIG 285.0 (H) 01/26/2021   HDL 29.60 (L) 01/26/2021   LDLDIRECT 173.0 01/26/2021   LDLCALC 143 (H) 08/13/2019   ALT 25 01/26/2021   AST 18 01/26/2021   NA 138 01/26/2021   K 4.0 01/26/2021   CL 98 01/26/2021   CREATININE 1.08 01/26/2021   BUN 20 01/26/2021   CO2 29 01/26/2021   TSH 1.07 01/26/2021   PSA 0.95 01/26/2021   HGBA1C 5.7 01/26/2021   Assessment/Plan:  Curtis [name] is a 47 y.o. White or Caucasian [1] male with  has a past medical history of ACNE, CYSTIC, Arthritis, IBS (irritable bowel syndrome), INGUINAL HERNIA, RIGHT (04/24/2008), Lactose intolerance, and PONV (postoperative nausea and vomiting).  Hyperglycemia Lab Results  Component Value Date   HGBA1C 5.7 01/26/2021   Stable, pt to continue current medical treatment  - diet, wt control   Vitamin D deficiency Last vitamin D Lab Results  Component Value Date   VD25OH 83.77 01/26/2021   Stable, cont oral replacement   B12 deficiency Lab Results  Component Value Date   VITAMINB12 1,057 (H) 01/26/2021  Stable, cont oral replacement - b12 1000 mcg qd   Orchitis Mild to mod, for antibx course doxycyline 100 bid,  to f/u any worsening symptoms or concerns  STD exposure Also for STD labs as ordered   Followup: Return in about 2 months (around 07/12/2022).  Oliver Barre, MD 05/17/2022 7:38 PM [facility]

## 2022-05-12 NOTE — Patient Instructions (Signed)
Please take all new medication as prescribed - the antibiotic  Please continue all other medications as before, and refills have been done if requested.  Please have the pharmacy call with any other refills you may need  Please keep your appointments with your specialists as you may have planned  Please go to the LAB at the blood drawing area for the tests to be done - just the STD testing today  You will be contacted by phone if any changes need to be made immediately.  Otherwise, you will receive a letter about your results with an explanation, but please check with MyChart first.  Please remember to sign up for MyChart if you have not done so, as this will be important to you in the future with finding out test results, communicating by private email, and scheduling acute appointments online when needed.  Please make an Appointment to return in 1 - 2 months, or sooner if needed, also with Labs done at the [facility] Lab in the basement af few days ahead

## 2022-05-13 LAB — HIV ANTIBODY (ROUTINE TESTING W REFLEX): HIV 1&2 Ab, 4th Generation: NONREACTIVE

## 2022-05-13 LAB — URINE CULTURE: Result:: NO GROWTH

## 2022-05-13 LAB — RPR: RPR Ser Ql: NONREACTIVE

## 2022-05-13 LAB — HSV 2 ANTIBODY, IGG: HSV 2 Glycoprotein G Ab, IgG: <0.9 index

## 2022-05-16 LAB — GC/CHLAMYDIA PROBE AMP
Chlamydia trachomatis, NAA: NEGATIVE
Neisseria Gonorrhoeae by PCR: NEGATIVE

## 2022-05-17 ENCOUNTER — Encounter: Payer: Self-pay | Admitting: Internal Medicine

## 2022-05-17 NOTE — Assessment & Plan Note (Signed)
Last vitamin D Lab Results  Component Value Date   VD25OH 83.77 01/26/2021   Stable, cont oral replacement

## 2022-05-17 NOTE — Assessment & Plan Note (Signed)
Also for STD labs as ordered

## 2022-05-17 NOTE — Assessment & Plan Note (Signed)
Mild to mod, for antibx course doxycyline 100 bid,  to f/u any worsening symptoms or concerns

## 2022-05-17 NOTE — Assessment & Plan Note (Signed)
Lab Results  °Component Value Date  ° VITAMINB12 1,057 (H) 01/26/2021  ° °Stable, cont oral replacement - b12 1000 mcg qd ° °

## 2022-05-17 NOTE — Assessment & Plan Note (Signed)
Lab Results  Component Value Date   HGBA1C 5.7 01/26/2021   Stable, pt to continue current medical treatment  - diet, wt control

## 2022-06-05 ENCOUNTER — Encounter: Payer: Self-pay | Admitting: Student

## 2022-06-05 NOTE — Telephone Encounter (Signed)
Dr Thora Lance, please advise on pt email:  Hi Doc, so the scan was gonna cost me 1200 with my crappy insurance. Was saving to get it done and everything cleared up. Primary doc thinks it was a moderate allergic reaction to the bed bug dust mite infestation.  New mattress and weekly cleaning have helped a TON. Still willing to get a scan... just when I can Afford it.   You were great and your advice was too. If I had the $ I would do it tomorrow just to be sure, but things are much better now.  Thank you for your time and care. Please reach out as needed

## 2022-06-22 ENCOUNTER — Other Ambulatory Visit (INDEPENDENT_AMBULATORY_CARE_PROVIDER_SITE_OTHER): Payer: 59

## 2022-06-22 DIAGNOSIS — Z125 Encounter for screening for malignant neoplasm of prostate: Secondary | ICD-10-CM | POA: Diagnosis not present

## 2022-06-22 DIAGNOSIS — E538 Deficiency of other specified B group vitamins: Secondary | ICD-10-CM | POA: Diagnosis not present

## 2022-06-22 DIAGNOSIS — R739 Hyperglycemia, unspecified: Secondary | ICD-10-CM

## 2022-06-22 DIAGNOSIS — E7800 Pure hypercholesterolemia, unspecified: Secondary | ICD-10-CM

## 2022-06-22 DIAGNOSIS — E559 Vitamin D deficiency, unspecified: Secondary | ICD-10-CM | POA: Diagnosis not present

## 2022-06-22 LAB — HEMOGLOBIN A1C: Hgb A1c MFr Bld: 5.6 % (ref 4.6–6.5)

## 2022-06-22 LAB — CBC WITH DIFFERENTIAL/PLATELET
Basophils Absolute: 0 10*3/uL (ref 0.0–0.1)
Basophils Relative: 0.3 % (ref 0.0–3.0)
Eosinophils Absolute: 0.1 10*3/uL (ref 0.0–0.7)
Eosinophils Relative: 1.3 % (ref 0.0–5.0)
HCT: 45.1 % (ref 39.0–52.0)
Hemoglobin: 15 g/dL (ref 13.0–17.0)
Lymphocytes Relative: 27.4 % (ref 12.0–46.0)
Lymphs Abs: 2.5 10*3/uL (ref 0.7–4.0)
MCHC: 33.3 g/dL (ref 30.0–36.0)
MCV: 88.9 fl (ref 78.0–100.0)
Monocytes Absolute: 0.9 10*3/uL (ref 0.1–1.0)
Monocytes Relative: 10.3 % (ref 3.0–12.0)
Neutro Abs: 5.5 10*3/uL (ref 1.4–7.7)
Neutrophils Relative %: 60.7 % (ref 43.0–77.0)
Platelets: 285 10*3/uL (ref 150.0–400.0)
RBC: 5.07 Mil/uL (ref 4.22–5.81)
RDW: 13.9 % (ref 11.5–15.5)
WBC: 9.1 10*3/uL (ref 4.0–10.5)

## 2022-06-22 LAB — URINALYSIS, ROUTINE W REFLEX MICROSCOPIC
Bilirubin Urine: NEGATIVE
Ketones, ur: NEGATIVE
Leukocytes,Ua: NEGATIVE
Nitrite: NEGATIVE
Specific Gravity, Urine: <=1.005 — AB (ref 1.000–1.030)
Total Protein, Urine: NEGATIVE
Urine Glucose: NEGATIVE
Urobilinogen, UA: 0.2 (ref 0.0–1.0)
WBC, UA: NONE SEEN (ref 0–?)
pH: 5.5 (ref 5.0–8.0)

## 2022-06-22 LAB — TSH: TSH: 1.65 u[IU]/mL (ref 0.35–5.50)

## 2022-06-22 LAB — PSA: PSA: 4.03 ng/mL — ABNORMAL HIGH (ref 0.10–4.00)

## 2022-06-23 LAB — BASIC METABOLIC PANEL
BUN: 15 mg/dL (ref 6–23)
CO2: 24 mEq/L (ref 19–32)
Calcium: 9.4 mg/dL (ref 8.4–10.5)
Chloride: 101 mEq/L (ref 96–112)
Creatinine, Ser: 1.03 mg/dL (ref 0.40–1.50)
GFR: 86.2 mL/min (ref >60.00)
Glucose, Bld: 94 mg/dL (ref 70–99)
Potassium: 3.8 mEq/L (ref 3.5–5.1)
Sodium: 139 mEq/L (ref 135–145)

## 2022-06-23 LAB — HEPATIC FUNCTION PANEL
ALT: 27 U/L (ref 0–53)
AST: 17 U/L (ref 0–37)
Albumin: 4.6 g/dL (ref 3.5–5.2)
Alkaline Phosphatase: 81 U/L (ref 39–117)
Bilirubin, Direct: 0.1 mg/dL (ref 0.0–0.3)
Total Bilirubin: 0.4 mg/dL (ref 0.2–1.2)
Total Protein: 7.3 g/dL (ref 6.0–8.3)

## 2022-06-23 LAB — LIPID PANEL
Cholesterol: 207 mg/dL — ABNORMAL HIGH (ref 0–200)
HDL: 25.8 mg/dL — ABNORMAL LOW (ref >39.00)
NonHDL: 180.89
Total CHOL/HDL Ratio: 8
Triglycerides: 204 mg/dL — ABNORMAL HIGH (ref 0.0–149.0)
VLDL: 40.8 mg/dL — ABNORMAL HIGH (ref 0.0–40.0)

## 2022-06-23 LAB — LDL CHOLESTEROL, DIRECT: Direct LDL: 150 mg/dL

## 2022-06-23 LAB — VITAMIN B12: Vitamin B-12: 696 pg/mL (ref 211–911)

## 2022-06-23 LAB — VITAMIN D 25 HYDROXY (VIT D DEFICIENCY, FRACTURES): VITD: 36.27 ng/mL (ref 30.00–100.00)

## 2022-06-27 ENCOUNTER — Encounter: Payer: Self-pay | Admitting: Internal Medicine

## 2022-06-27 ENCOUNTER — Ambulatory Visit (INDEPENDENT_AMBULATORY_CARE_PROVIDER_SITE_OTHER): Payer: 59 | Admitting: Internal Medicine

## 2022-06-27 VITALS — BP 120/72 | HR 80 | Temp 98.2°F | Ht 73.0 in | Wt 168.0 lb

## 2022-06-27 DIAGNOSIS — R972 Elevated prostate specific antigen [PSA]: Secondary | ICD-10-CM

## 2022-06-27 DIAGNOSIS — B001 Herpesviral vesicular dermatitis: Secondary | ICD-10-CM

## 2022-06-27 DIAGNOSIS — R739 Hyperglycemia, unspecified: Secondary | ICD-10-CM | POA: Diagnosis not present

## 2022-06-27 DIAGNOSIS — E782 Mixed hyperlipidemia: Secondary | ICD-10-CM | POA: Diagnosis not present

## 2022-06-27 DIAGNOSIS — E785 Hyperlipidemia, unspecified: Secondary | ICD-10-CM | POA: Insufficient documentation

## 2022-06-27 DIAGNOSIS — Z0001 Encounter for general adult medical examination with abnormal findings: Secondary | ICD-10-CM

## 2022-06-27 DIAGNOSIS — E559 Vitamin D deficiency, unspecified: Secondary | ICD-10-CM

## 2022-06-27 DIAGNOSIS — E538 Deficiency of other specified B group vitamins: Secondary | ICD-10-CM

## 2022-06-27 MED ORDER — VALACYCLOVIR HCL 1 G PO TABS: 1000.0000 mg | ORAL_TABLET | Freq: Three times a day (TID) | ORAL | 5 refills | Status: AC | PRN

## 2022-06-27 MED ORDER — DOXYCYCLINE HYCLATE 100 MG PO TABS: 100.0000 mg | ORAL_TABLET | Freq: Two times a day (BID) | ORAL | 0 refills | Status: AC

## 2022-06-27 NOTE — Progress Notes (Signed)
Patient ID: Curtis [name], male   DOB: [birth year], 47 y.o.   MRN: [id number]         Chief Complaint:: wellness exam and recurrent cold sores, hld, low vit d, elevated psa       HPI:  Curtis [name] is a 47 y.o. male here for wellness exam; declines covid booster, o/w up to date                        Also Denies urinary symptoms such as dysuria, frequency, urgency, flank pain, hematuria or n/v, fever, chills., but does have elevated PSA suddently.  Trying to follow lower chol diet.  Has not been taking Vit D as before.  Does have recurrent cold sores now to the lower lip corner active.  Pt denies chest pain, increased sob or doe, wheezing, orthopnea, PND, increased LE swelling, palpitations, dizziness or syncope.   Pt denies polydipsia, polyuria, or new focal neuro s/s.    Pt denies fever, wt loss, night sweats, loss of appetite, or other constitutional symptoms     Wt Readings from Last 3 Encounters:  06/27/22 168 lb (76.2 kg)  05/12/22 169 lb (76.7 kg)  03/16/22 170 lb 9.6 oz (77.4 kg)   BP Readings from Last 3 Encounters:  06/27/22 120/72  05/12/22 120/74  03/16/22 126/74   Immunization History  Administered Date(s) Administered   Influenza Split 12/07/2010, 12/05/2011   Influenza,inj,Quad PF,6+ Mos 08/20/2013   Td 01/02/2006   Tdap 08/09/2018   Health Maintenance Due  Topic Date Due   COVID-19 Vaccine (1) Never done      Past Medical History:  Diagnosis Date   ACNE, CYSTIC    resolved   Arthritis    spine, early onset   IBS (irritable bowel syndrome)    INGUINAL HERNIA, RIGHT 04/24/2008   s/p RIH repair   Lactose intolerance    diet controlled   PONV (postoperative nausea and vomiting)    Past Surgical History:  Procedure Laterality Date   COLONOSCOPY     HERNIA REPAIR Right 2010   Dr. Rich Brave HERNIA REPAIR Bilateral 03/14/2018   Procedure: LAPAROSCOPIC BILATERAL  INGUINAL HERNIA REPAIR  WITH MESH ERAS PAHTWAY;  Surgeon: Karie Soda, MD;   Location: [facility];  Service: General;  Laterality: Bilateral;   Surgery for spont pneumothorax Right 1996   UMBILICAL HERNIA REPAIR N/A 03/14/2018   Procedure: HERNIA REPAIR UMBILICAL ADULT;  Surgeon: Karie Soda, MD;  Location[facility];  Service: General;  Laterality: N/A;   WISDOM TOOTH EXTRACTION      reports that he has never smoked. He has never been exposed to tobacco smoke. He has never used smokeless tobacco. He reports current alcohol use of about 3.0 standard drinks of alcohol per week. He reports current drug use. Drug: Marijuana. family history includes Stroke (age of onset: 83) in his maternal grandmother; Urolithiasis in his father. Allergies  Allergen Reactions   Levofloxacin Other (See Comments)    GI problems   Current Outpatient Medications on File Prior to Visit  Medication Sig Dispense Refill   fluticasone furoate-vilanterol (BREO ELLIPTA) 200-25 MCG/ACT AEPB Inhale 1 puff into the lungs daily. 60 each 6   mesalamine (LIALDA) 1.2 g EC tablet TAKE 2 TABLETS BY MOUTH TWICE A DAY 360 tablet 3   Probiotic Product (PROBIOTIC PO) Take 1 capsule by mouth daily.     azithromycin (ZITHROMAX) 250 MG tablet  2 tablets on day 1, then 1 tablet daily till gone (Patient not taking: Reported on 06/27/2022) 6 tablet 0   No current facility-administered medications on file prior to visit.        ROS:  All others reviewed and negative.  Objective        PE:  BP 120/72 (BP Location: Left Arm, Patient Position: Sitting, Cuff Size: Normal)   Pulse 80   Temp 98.2 F (36.8 C) (Oral)   Ht 6\' 1"  (1.854 m)   Wt 168 lb (76.2 kg)   SpO2 98%   BMI 22.16 kg/m                 Constitutional: Pt appears in NAD               HENT: Head: NCAT.                Right Ear: External ear normal.                 Left Ear: External ear normal.                Eyes: . Pupils are equal, round, and reactive to light. Conjunctivae and EOM are normal                Nose: without d/c or deformity               Neck: Neck supple. Gross normal ROM               Cardiovascular: Normal rate and regular rhythm.                 Pulmonary/Chest: Effort normal and breath sounds without rales or wheezing.                Abd:  Soft, NT, ND, + BS, no organomegaly               Neurological: Pt is alert. At baseline orientation, motor grossly intact               Skin: Skin is warm. No rashes, no other new lesions, LE edema - none               Psychiatric: Pt behavior is normal without agitation   Micro: none  Cardiac tracings I have personally interpreted today:  none  Pertinent Radiological findings (summarize): none   Lab Results  Component Value Date   WBC 9.1 06/22/2022   HGB 15.0 06/22/2022   HCT 45.1 06/22/2022   PLT 285.0 06/22/2022   GLUCOSE 94 06/22/2022   CHOL 207 (H) 06/22/2022   TRIG 204.0 (H) 06/22/2022   HDL 25.80 (L) 06/22/2022   LDLDIRECT 150.0 06/22/2022   LDLCALC 143 (H) 08/13/2019   ALT 27 06/22/2022   AST 17 06/22/2022   NA 139 06/22/2022   K 3.8 06/22/2022   CL 101 06/22/2022   CREATININE 1.03 06/22/2022   BUN 15 06/22/2022   CO2 24 06/22/2022   TSH 1.65 06/22/2022   PSA 4.03 (H) 06/22/2022   HGBA1C 5.6 06/22/2022   Assessment/Plan:  Curtis [name] is a 47 y.o. White or Caucasian [1] male with  has a past medical history of ACNE, CYSTIC, Arthritis, IBS (irritable bowel syndrome), INGUINAL HERNIA, RIGHT (04/24/2008), Lactose intolerance, and PONV (postoperative nausea and vomiting).  Encounter for well adult exam with abnormal findings Age and sex appropriate education and counseling updated with regular exercise and diet Referrals  for preventative services - none needed Immunizations addressed - declines covid booster Smoking counseling  - none needed Evidence for depression or other mood disorder - none significant Most recent labs reviewed. I have personally reviewed and have noted: 1) the patient's medical and  social history 2) The patient's current medications and supplements 3) The patient's height, weight, and BMI have been recorded in the chart   Hyperglycemia Lab Results  Component Value Date   HGBA1C 5.6 06/22/2022   Stable, pt to continue current medical treatment  - diet, wt control  Vitamin D deficiency Last vitamin D Lab Results  Component Value Date   VD25OH 36.27 06/22/2022   Low, to start oral replacement   B12 deficiency Lab Results  Component Value Date   VITAMINB12 696 06/22/2022   Stable, cont oral replacement - b12 1000 mcg qd   HLD (hyperlipidemia) Lab Results  Component Value Date   LDLCALC 143 (H) 08/13/2019   Uncontrolled, goal ldl < 100, pt for lower chol diet, declines statin for now  Recurrent cold sores Currently active - for valtrex prn asd 1000 tid  Elevated PSA Lab Results  Component Value Date   PSA 4.03 (H) 06/22/2022   PSA 0.95 01/26/2021   PSA 1.0 08/13/2019    Asympt but likely mild prostate irritation or infection - for doxy course x 3 wks and f/u psa  Followup: Return in about 1 year (around 06/27/2023).  Oliver Barre, MD 07/01/2022 2:05 PM [facility]

## 2022-06-27 NOTE — Patient Instructions (Signed)
Please take all new medication as prescribed  - the doxycyline for 3 wks, the return to the LAB for repeat PSA  Please take all new medication as prescribed - the valtrex as needed for cold sores  Please continue all other medications as before, and refills have been done if requested.  Please have the pharmacy call with any other refills you may need.  Please continue your efforts at being more active, low cholesterol diet, and weight control.  You are otherwise up to date with prevention measures today.  Please keep your appointments with your specialists as you may have planned  Please make an Appointment to return for your 1 year visit, or sooner if needed, with Lab testing by Appointment as well, to be done about 3-5 days before at the FIRST FLOOR Lab (so this is for TWO appointments - please see the scheduling desk as you leave)

## 2022-06-29 ENCOUNTER — Telehealth: Payer: Self-pay | Admitting: Pulmonary Disease

## 2022-06-29 NOTE — Telephone Encounter (Signed)
Needs a refill for BREO ELLIPTA   Pharmacy: [facility] on [address]

## 2022-07-01 ENCOUNTER — Encounter: Payer: Self-pay | Admitting: Internal Medicine

## 2022-07-01 NOTE — Assessment & Plan Note (Signed)
Currently active - for valtrex prn asd 1000 tid

## 2022-07-01 NOTE — Assessment & Plan Note (Signed)
Lab Results  Component Value Date   LDLCALC 143 (H) 08/13/2019   Uncontrolled, goal ldl < 100, pt for lower chol diet, declines statin for now

## 2022-07-01 NOTE — Assessment & Plan Note (Signed)
Lab Results  Component Value Date   VITAMINB12 696 06/22/2022   Stable, cont oral replacement - b12 1000 mcg qd

## 2022-07-01 NOTE — Assessment & Plan Note (Signed)
Lab Results  Component Value Date   HGBA1C 5.6 06/22/2022   Stable, pt to continue current medical treatment  - diet, wt control

## 2022-07-01 NOTE — Assessment & Plan Note (Signed)

## 2022-07-01 NOTE — Assessment & Plan Note (Signed)
Last vitamin D Lab Results  Component Value Date   VD25OH 36.27 06/22/2022   Low, to start oral replacement

## 2022-07-01 NOTE — Assessment & Plan Note (Signed)
Lab Results  Component Value Date   PSA 4.03 (H) 06/22/2022   PSA 0.95 01/26/2021   PSA 1.0 08/13/2019    Asympt but likely mild prostate irritation or infection - for doxy course x 3 wks and f/u psa

## 2022-07-04 MED ORDER — FLUTICASONE FUROATE-VILANTEROL 200-25 MCG/ACT IN AEPB: 1.0000 | INHALATION_SPRAY | Freq: Every day | RESPIRATORY_TRACT | 6 refills | Status: DC

## 2022-07-04 NOTE — Telephone Encounter (Signed)
Called pt no answer, lvmm informing pt his breo has been refilled. NFN

## 2022-07-28 ENCOUNTER — Other Ambulatory Visit: Payer: 59

## 2022-07-28 DIAGNOSIS — R972 Elevated prostate specific antigen [PSA]: Secondary | ICD-10-CM

## 2022-07-28 LAB — PSA: PSA: 2.9 ng/mL (ref 0.10–4.00)

## 2022-09-22 ENCOUNTER — Telehealth: Payer: Self-pay | Admitting: Gastroenterology

## 2022-09-22 NOTE — Telephone Encounter (Signed)
Patient states that he has prolapsed hemorrhoid and states that urology is telling him that this is coming from his pelvic floor? Patient is requesting to speak to a nurse regarding this.

## 2022-09-22 NOTE — Telephone Encounter (Signed)
The pt has a prolapsed hemorrhoid and was seen by his urologist recently. He was told that he has a "pelvic floor" issue and needs to see Dr Russella Dar.  There are no appts available at this time.  I have a spot on 10/10/22 at 930 am with MS. I have advised that I will call the pt on 10/03/22 after I am able to get this appt made.  The pt has been advised of the information and verbalized understanding.

## 2022-10-03 ENCOUNTER — Telehealth: Payer: Self-pay

## 2022-10-03 NOTE — Telephone Encounter (Signed)
I have spoken to the pt and advised of the appt for 10/8 with MS.  He will call if he cannot keep this appt.

## 2022-10-03 NOTE — Telephone Encounter (Signed)
10/10/22 at 930 am appt with Dr Russella Dar  Left message on machine to call back

## 2022-10-03 NOTE — Telephone Encounter (Signed)
-----   Message from Nurse Eudora Guevarra P sent at 09/22/2022  3:43 PM EDT ----- Need to get pt in the schedule for 10/8 at 930 am with MS

## 2022-10-04 ENCOUNTER — Encounter: Payer: Self-pay | Admitting: Gastroenterology

## 2022-10-10 ENCOUNTER — Ambulatory Visit (INDEPENDENT_AMBULATORY_CARE_PROVIDER_SITE_OTHER): Payer: 59 | Admitting: Gastroenterology

## 2022-10-10 ENCOUNTER — Encounter: Payer: Self-pay | Admitting: Gastroenterology

## 2022-10-10 VITALS — BP 128/72 | HR 79 | Ht 73.0 in | Wt 173.0 lb

## 2022-10-10 DIAGNOSIS — K5130 Ulcerative (chronic) rectosigmoiditis without complications: Secondary | ICD-10-CM

## 2022-10-10 DIAGNOSIS — K642 Third degree hemorrhoids: Secondary | ICD-10-CM

## 2022-10-10 MED ORDER — HYDROCORTISONE ACETATE 25 MG RE SUPP: 25.0000 mg | Freq: Two times a day (BID) | RECTAL | 0 refills | Status: AC

## 2022-10-10 NOTE — Patient Instructions (Signed)
We have sent the following medications to your pharmacy for you to pick up at your convenience: Anusol suppositories to use twice daily x 2 weeks.   RECTAL CARE INSTRUCTIONS:  1. Sitz Baths twice a day for 10 minutes each. 2. Thoroughly clean and dry the rectum. 3. Put Tucks pad against the rectum at night. 4. Clean the rectum with Balenol lotion after each bowel movement.  If this does help, please contact our office.  The [facility] providers would like to encourage you to use [facility] to communicate with providers for non-urgent requests or questions.  Due to long hold times on the telephone, sending your provider a message by [facility] may be a faster and more efficient way to get a response.  Please allow 48 business hours for a response.  Please remember that this is for non-urgent requests.   Thank you for choosing me and [facility].  Venita Lick. Pleas Koch., MD., Clementeen Graham

## 2022-10-10 NOTE — Progress Notes (Signed)
    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 Proceed with anoscopy and hemorrhoid banding if not improved Colonoscopy in May 2028 Continue pelvic floor PT as recommended by Dr. Annabell Howells   HPI    This is a 47 year old male with ulcerative colitis who notes prolapsing hemorrhoids requiring manual reduction for several weeks. He generally has two urgent loose stools in the early morning associated with transient periumbilical pain that resolves with a BM. He has testicular pain, scrotal pain, voiding difficulties, hydrocele and pelvic floor dysfunction followed by Dr. Bjorn Pippin.    Labs / Imaging       Latest Ref Rng & Units 06/22/2022    4:27 PM 01/26/2021    2:06 PM 06/24/2020    2:56 PM  Hepatic Function  Total Protein 6.0 - 8.3 g/dL 7.3  7.8  7.2   Albumin 3.5 - 5.2 g/dL 4.6  5.0  4.7   AST 0 - 37 U/L 17  18  16    ALT 0 - 53 U/L 27  25  23    Alk Phosphatase 39 - 117 U/L 81  87  70   Total Bilirubin 0.2 - 1.2 mg/dL 0.4  0.4  0.5   Bilirubin, Direct 0.0 - 0.3 mg/dL 0.1  0.1         Latest Ref Rng & Units 06/22/2022    4:27 PM 01/26/2021    2:06 PM 06/24/2020    2:56 PM  CBC  WBC 4.0 - 10.5 K/uL 9.1  10.2  10.1   Hemoglobin 13.0 - 17.0 g/dL 29.5  28.4  13.2   Hematocrit 39.0 - 52.0 % 45.1  46.2  44.6   Platelets 150.0 - 400.0 K/uL 285.0  298.0  267      DG Chest 2 View CLINICAL DATA:  Cough, history of pneumothorax  EXAM: CHEST - 2 VIEW  COMPARISON:  01/26/2021  FINDINGS: The heart size and mediastinal contours are within normal limits. Mild diffuse interstitial pulmonary opacity. Mild right apical pleural-parenchymal scarring. The visualized skeletal structures are unremarkable.  IMPRESSION: Mild diffuse interstitial pulmonary opacity suggesting atypical  or viral infection. No focal airspace opacity.  Electronically Signed   By: Jearld Lesch M.D.   On: 03/16/2022 09:55   Current Medications, Allergies, Past Medical History, Past Surgical History, Family History and Social History were reviewed in [employer] record.   Physical Exam: General: Well developed, well nourished, no acute distress Head: Normocephalic and atraumatic Eyes: Sclerae anicteric, EOMI Ears: Normal auditory acuity Mouth: No deformities or lesions noted Lungs: Clear throughout to auscultation Heart: Regular rate and rhythm; No murmurs, rubs or bruits Abdomen: Soft, non tender and non distended. No masses, hepatosplenomegaly or hernias noted. Normal Bowel sounds Rectal: Musculoskeletal: Symmetrical with no gross deformities  Pulses:  Normal pulses noted Extremities: No edema or deformities noted Neurological: Alert oriented x 4, grossly nonfocal Psychological:  Alert and cooperative. Normal mood and affect   Abryana Lykens T. Russella Dar, MD 10/10/2022, 9:23 AM

## 2023-01-09 ENCOUNTER — Ambulatory Visit: Payer: Self-pay | Admitting: Internal Medicine

## 2023-01-09 NOTE — Telephone Encounter (Signed)
 Copied from CRM [id number]. Topic: Clinical - Red Word Triage >> Jan 09, 2023 12:05 PM Adaysia C wrote: Red Word that prompted transfer to Nurse Triage: Patient is experiencing fluid and swelling in his left testicle after his hernia surgery and would like to speak with a nurse about what he should do.   Chief Complaint: Left testicular swelling Symptoms: Swelling and off and on pain--mostly in the mornings Frequency: over 6 months Pertinent Negatives: Patient denies Pain at this time, pain with palpation  Disposition: [] ED /[] Urgent Care (no appt availability in office) / [x] Appointment(In office/virtual)/ []  [place] [facility]/ [] Home Care/ [] Refused Recommended Disposition /[] [place] [facility]/ []  Follow-up with PCP Additional Notes: Patient called and advised that he has left testicular swelling that he has had since a hernia surgery. Patient had a hernia surgery in 2020 and now his left testicle is swollen with fluid.  Urologist office has no openings for three months.  Patient is concerned for hernia complications although he was told by the urologist that this can happen.  The surgeon who did the hernia surgery went to [facility] and is no longer part of [facility] per patient. Patient has had some pain with this and some pain with urination. Urologist stated to the patient that with hernias this can occur.  Patient states that when he palpates the left testicle there is no pain at all and he has had this issue for more than 6 months and it has been assessed multiple times.  He called his urologist to make the appointment for the 3 month follow-up, however, they had no openings for 3 more months and the patient did not want to wait that long because he is supposed to have it reassessed soon.  Patient is advised that testicular torsion is one of the conditions that can be emergent and requires surgery, however patient states that he had an ultrasound before months ago and the condition of his left  testicle has remained the exact same.  Patient is advised that if anything worsens or he gets concerned about anything different, to go to the emergency room immediately to have this evaluated.  Patient verbalized understanding of these instructions.  This RN was able to get an appointment made for this patient with his PCP this Thursday 01/11/2023 at 3:40pm.  This RN emphasized to the patient that if anything changes from how this has been for the past 6 months or more to go directly to the emergency room.  Patient again verbalized understanding.  Reason for Disposition  [1] Scrotum swelling AND [2] no pain  Answer Assessment - Initial Assessment Questions 1. SCROTAL SWELLING: What does the scrotum look like? How swollen is it? (mild, moderate severe; compare to other side)     3 times the size of the right testicle 2. LOCATION: Where is the swelling located?     Left testicle 3. ONSET: When did the swelling start?     Patient states months and months ago---he saw his urologist 3 months ago had an ultrasound and needs to schedule a follow up but the urologist is three more months before they can see him 4. PATTERN: Does it come and go, or has it been constant since it started?     Constantly swollen---maybe a little worse than when his urologist saw it a while ago 5. SCROTAL PAIN: Is there any pain? If Yes, ask: How bad is it?  (Scale 1-10; or mild, moderate, severe)     A  little bit off and on--worse in the first half of day 6. HERNIA: Has a doctor ever told you that you have a hernia?     Yes--- 7. OTHER SYMPTOMS: Do you have any other symptoms? (e.g., abdomen pain, difficulty passing urine, fever, vomiting)     Pain with urination, some off and on pain, some nausea a few weeks ago with riding go carts at that time  Protocols used: Scrotum Swelling-A-AH

## 2023-01-10 ENCOUNTER — Telehealth: Payer: Self-pay | Admitting: Gastroenterology

## 2023-01-10 NOTE — Telephone Encounter (Signed)
 The pt has been advised that he should establish care with Dr Doy Hutching to discuss since Dr Russella Dar has retired.  The pt has been advised of the information and verbalized understanding.    He will call back to make that appt.

## 2023-01-10 NOTE — Telephone Encounter (Signed)
 Inbound call from patient, states he would like referral for a surgeon to get his hemorrhoids removed. Patient states he has tried multiple remedies that Dr. Aneita recommended, he would like to know if Dr. Suzann recommends another appointment or if referral to surgeon can be made directly.

## 2023-01-11 ENCOUNTER — Ambulatory Visit (INDEPENDENT_AMBULATORY_CARE_PROVIDER_SITE_OTHER): Payer: 59 | Admitting: Internal Medicine

## 2023-01-11 VITALS — BP 122/80 | HR 70 | Temp 98.1°F | Ht 73.0 in | Wt 168.0 lb

## 2023-01-11 DIAGNOSIS — E559 Vitamin D deficiency, unspecified: Secondary | ICD-10-CM

## 2023-01-11 DIAGNOSIS — R739 Hyperglycemia, unspecified: Secondary | ICD-10-CM

## 2023-01-11 DIAGNOSIS — M79672 Pain in left foot: Secondary | ICD-10-CM | POA: Diagnosis not present

## 2023-01-11 DIAGNOSIS — N433 Hydrocele, unspecified: Secondary | ICD-10-CM

## 2023-01-11 DIAGNOSIS — K649 Unspecified hemorrhoids: Secondary | ICD-10-CM | POA: Diagnosis not present

## 2023-01-11 NOTE — Progress Notes (Signed)
 Patient ID: Curtis [name], male   DOB: 07-26-1975, 48 y.o.   MRN: [id number]        Chief Complaint: follow up left testicle swelling, hemorrhoid, left foot pain       HPI:  Curtis [name] is a 48 y.o. male here with c/o persistent left testicle area pain and swelling x 6 mo just not improved despite antibx course; did see urology, and u/s report resulting just today indicates left hydrocele.  No specific tx offered per urology per pt, but he plans to f/u soon. Pt states he was reassured there was no evidence on exam of inguinal hernia.  Very nervous it seems about this as the pain is at least mild to mod constant with most movements  Conts to be heavily concerned he may eventually develop torsion.  . Denies urinary symptoms such as dysuria, frequency, urgency, flank pain, hematuria or n/v, fever, chills.Incidentally also has recent onset hemorrhoid discomfort , also has knot to the left plantar foot just proximal to the right MTP ongoing mild to mod for several months as well, hurts to walk on it.          Wt Readings from Last 3 Encounters:  01/11/23 168 lb (76.2 kg)  10/10/22 173 lb (78.5 kg)  06/27/22 168 lb (76.2 kg)   BP Readings from Last 3 Encounters:  01/11/23 122/80  10/10/22 128/72  06/27/22 120/72         Past Medical History:  Diagnosis Date   ACNE, CYSTIC    resolved   Arthritis    spine, early onset   IBS (irritable bowel syndrome)    INGUINAL HERNIA, RIGHT 04/24/2008   s/p RIH repair   Lactose intolerance    diet controlled   PONV (postoperative nausea and vomiting)    Prostatitis    Past Surgical History:  Procedure Laterality Date   COLONOSCOPY     HERNIA REPAIR Right 2010   Dr. Sebastian FONTANA HERNIA REPAIR Bilateral 03/14/2018   Procedure: LAPAROSCOPIC BILATERAL  INGUINAL HERNIA REPAIR  WITH MESH ERAS PAHTWAY;  Surgeon: Sheldon Standing, MD;  Location: [facility] ;  Service: General;  Laterality: Bilateral;   Surgery for spont  pneumothorax Right 1996   UMBILICAL HERNIA REPAIR N/A 03/14/2018   Procedure: HERNIA REPAIR UMBILICAL ADULT;  Surgeon: Sheldon Standing, MD;  Location: [facility];  Service: General;  Laterality: N/A;   WISDOM TOOTH EXTRACTION      reports that he has never smoked. He has never been exposed to tobacco smoke. He has never used smokeless tobacco. He reports current alcohol use of about 3.0 standard drinks of alcohol per week. He reports current drug use. Drug: Marijuana. family history includes Stroke (age of onset: 82) in his maternal grandmother; Urolithiasis in his father. Allergies  Allergen Reactions   Levofloxacin  Other (See Comments)    GI problems   Current Outpatient Medications on File Prior to Visit  Medication Sig Dispense Refill   fluticasone  furoate-vilanterol (BREO ELLIPTA ) 200-25 MCG/ACT AEPB Inhale 1 puff into the lungs daily. 60 each 6   mesalamine  (LIALDA ) 1.2 g EC tablet TAKE 2 TABLETS BY MOUTH TWICE A DAY 360 tablet 3   Probiotic Product (PROBIOTIC PO) Take 1 capsule by mouth daily.     No current facility-administered medications on file prior to visit.        ROS:  All others reviewed and negative.  Objective  PE:  BP 122/80 (BP Location: Right Arm, Patient Position: Sitting, Cuff Size: Normal)   Pulse 70   Temp 98.1 F (36.7 C) (Oral)   Ht 6' 1 (1.854 m)   Wt 168 lb (76.2 kg)   SpO2 97%   BMI 22.16 kg/m                 Constitutional: Pt appears in NAD               HENT: Head: NCAT.                Right Ear: External ear normal.                 Left Ear: External ear normal.                Eyes: . Pupils are equal, round, and reactive to light. Conjunctivae and EOM are normal               Nose: without d/c or deformity               Neck: Neck supple. Gross normal ROM               Cardiovascular: Normal rate and regular rhythm.                 Pulmonary/Chest: Effort normal and breath sounds without rales or wheezing.                 Abd:  Soft, NT, ND, + BS, no organomegaly; no LIH noted, also left scrotum with what appears to be moderate sized hydrocele               Neurological: Pt is alert. At baseline orientation, motor grossly intact               Skin: Skin is warm. No rashes, no other new lesions, LE edema - none; left plantar foot with ganglion cyst like mass mild tender to plantar tendon just proximal to the first MTP               Psychiatric: Pt behavior is normal without agitation but mod nervous  Micro: none  Cardiac tracings I have personally interpreted today:  none  Pertinent Radiological findings (summarize): none   Lab Results  Component Value Date   WBC 9.1 06/22/2022   HGB 15.0 06/22/2022   HCT 45.1 06/22/2022   PLT 285.0 06/22/2022   GLUCOSE 94 06/22/2022   CHOL 207 (H) 06/22/2022   TRIG 204.0 (H) 06/22/2022   HDL 25.80 (L) 06/22/2022   LDLDIRECT 150.0 06/22/2022   LDLCALC 143 (H) 08/13/2019   ALT 27 06/22/2022   AST 17 06/22/2022   NA 139 06/22/2022   K 3.8 06/22/2022   CL 101 06/22/2022   CREATININE 1.03 06/22/2022   BUN 15 06/22/2022   CO2 24 06/22/2022   TSH 1.65 06/22/2022   PSA 2.90 07/28/2022   HGBA1C 5.6 06/22/2022   Assessment/Plan:  [name] is a 48 y.o. White or Caucasian [1] male with  has a past medical history of ACNE, CYSTIC, Arthritis, IBS (irritable bowel syndrome), INGUINAL HERNIA, RIGHT (04/24/2008), Lactose intolerance, PONV (postoperative nausea and vomiting), and Prostatitis.  Left hydrocele Mod sized , mild tender, pt reassured no evidence for torsion or likely to be soon, ok for f/u urology as planned - pt states next appt likely mar 2025  Left foot pain Likely plantar ganglion cyst -  for podiatry referral  Hemorrhoids Also for general surgury referral as requested  Hyperglycemia Lab Results  Component Value Date   HGBA1C 5.6 06/22/2022   Stable, pt to continue current medical treatment  - diet, wt control   Vitamin D   deficiency Last vitamin D  Lab Results  Component Value Date   VD25OH 36.27 06/22/2022   Low, to start oral replacement   Followup: Return if symptoms worsen or fail to improve.  Curtis Rush, MD 01/13/2023 10:53 AM [facility]

## 2023-01-11 NOTE — Patient Instructions (Signed)
 Please continue all other medications as before, and refills have been done if requested.  Please have the pharmacy call with any other refills you may need.  Please continue your efforts at being more active, low cholesterol diet, and weight control.  You are otherwise up to date with prevention measures today.  Please keep your appointments with your specialists as you may have planned - Urology in Mar 2025  You will be contacted regarding the referral for: General Surgury for possible hemmorhoid and second opinion regarding possible hernia, as well as Podiatry for the left foot knot

## 2023-01-13 ENCOUNTER — Encounter: Payer: Self-pay | Admitting: Internal Medicine

## 2023-01-13 DIAGNOSIS — K649 Unspecified hemorrhoids: Secondary | ICD-10-CM | POA: Insufficient documentation

## 2023-01-13 DIAGNOSIS — N433 Hydrocele, unspecified: Secondary | ICD-10-CM | POA: Insufficient documentation

## 2023-01-13 DIAGNOSIS — M79672 Pain in left foot: Secondary | ICD-10-CM | POA: Insufficient documentation

## 2023-01-13 NOTE — Assessment & Plan Note (Signed)
 Also for general surgury referral as requested

## 2023-01-13 NOTE — Assessment & Plan Note (Signed)
Lab Results  Component Value Date   HGBA1C 5.6 06/22/2022   Stable, pt to continue current medical treatment  - diet, wt control

## 2023-01-13 NOTE — Assessment & Plan Note (Signed)
Last vitamin D Lab Results  Component Value Date   VD25OH 36.27 06/22/2022   Low, to start oral replacement

## 2023-01-13 NOTE — Assessment & Plan Note (Signed)
 Likely plantar ganglion cyst - for podiatry referral

## 2023-01-13 NOTE — Assessment & Plan Note (Addendum)
 Mod sized , mild tender, pt reassured no evidence for torsion or likely to be soon, ok for f/u urology as planned - pt states next appt likely mar 2025

## 2023-01-22 ENCOUNTER — Telehealth: Payer: Self-pay | Admitting: Pulmonary Disease

## 2023-01-22 NOTE — Telephone Encounter (Signed)
Can you confirm if the CT Chest is still necessary, or if it can be closed? Thank you!

## 2023-01-25 ENCOUNTER — Telehealth (INDEPENDENT_AMBULATORY_CARE_PROVIDER_SITE_OTHER): Payer: 59 | Admitting: Internal Medicine

## 2023-01-25 ENCOUNTER — Other Ambulatory Visit (INDEPENDENT_AMBULATORY_CARE_PROVIDER_SITE_OTHER): Payer: 59

## 2023-01-25 DIAGNOSIS — R739 Hyperglycemia, unspecified: Secondary | ICD-10-CM | POA: Diagnosis not present

## 2023-01-25 DIAGNOSIS — R1032 Left lower quadrant pain: Secondary | ICD-10-CM

## 2023-01-25 DIAGNOSIS — E538 Deficiency of other specified B group vitamins: Secondary | ICD-10-CM

## 2023-01-25 DIAGNOSIS — E559 Vitamin D deficiency, unspecified: Secondary | ICD-10-CM | POA: Diagnosis not present

## 2023-01-25 DIAGNOSIS — K219 Gastro-esophageal reflux disease without esophagitis: Secondary | ICD-10-CM | POA: Diagnosis not present

## 2023-01-25 DIAGNOSIS — R972 Elevated prostate specific antigen [PSA]: Secondary | ICD-10-CM | POA: Diagnosis not present

## 2023-01-25 DIAGNOSIS — E782 Mixed hyperlipidemia: Secondary | ICD-10-CM | POA: Diagnosis not present

## 2023-01-25 LAB — CBC WITH DIFFERENTIAL/PLATELET
Basophils Absolute: 0 10*3/uL (ref 0.0–0.1)
Basophils Relative: 0.3 % (ref 0.0–3.0)
Eosinophils Absolute: 0.1 10*3/uL (ref 0.0–0.7)
Eosinophils Relative: 1.3 % (ref 0.0–5.0)
HCT: 45.9 % (ref 39.0–52.0)
Hemoglobin: 15.4 g/dL (ref 13.0–17.0)
Lymphocytes Relative: 26.8 % (ref 12.0–46.0)
Lymphs Abs: 2.4 10*3/uL (ref 0.7–4.0)
MCHC: 33.5 g/dL (ref 30.0–36.0)
MCV: 90.8 fL (ref 78.0–100.0)
Monocytes Absolute: 0.9 10*3/uL (ref 0.1–1.0)
Monocytes Relative: 9.9 % (ref 3.0–12.0)
Neutro Abs: 5.6 10*3/uL (ref 1.4–7.7)
Neutrophils Relative %: 61.7 % (ref 43.0–77.0)
Platelets: 280 10*3/uL (ref 150.0–400.0)
RBC: 5.05 Mil/uL (ref 4.22–5.81)
RDW: 14.1 % (ref 11.5–15.5)
WBC: 9.1 10*3/uL (ref 4.0–10.5)

## 2023-01-25 LAB — MICROALBUMIN / CREATININE URINE RATIO
Creatinine,U: 26.1 mg/dL
Microalb Creat Ratio: 2.7 mg/g (ref 0.0–30.0)
Microalb, Ur: <0.7 mg/dL (ref 0.0–1.9)

## 2023-01-25 NOTE — Progress Notes (Signed)
Patient ID: Curtis [name], male   DOB: 04/13/1975, 48 y.o.   MRN: [id number]  Virtual Visit via Video Note  I connected with Curtis [name] on 01/25/23 at  1:40 PM EST by a video enabled telemedicine application and verified that I am speaking with the correct person using two identifiers.  Location of all participants today Patient: at home Provider: at office   I discussed the limitations of evaluation and management by telemedicine and the availability of in person appointments. The patient expressed understanding and agreed to proceed.  History of Present Illness: Here with persitent, worsening groin pain lef > right, asking for imaging  Has not yet heard from general surgury, has appt with urology late mar 2025.  Pt denies chest pain, increased sob or doe, wheezing, orthopnea, PND, increased LE swelling, palpitations, dizziness or syncope.   Pt denies polydipsia, polyuria, or new focal neuro s/s.  Denies worsening reflux, dysphagia, n/v, bowel change or blood but has worsening bilateral left > right pain and swelling.   Past Medical History:  Diagnosis Date   ACNE, CYSTIC    resolved   Arthritis    spine, early onset   IBS (irritable bowel syndrome)    INGUINAL HERNIA, RIGHT 04/24/2008   s/p RIH repair   Lactose intolerance    diet controlled   PONV (postoperative nausea and vomiting)    Prostatitis    Past Surgical History:  Procedure Laterality Date   COLONOSCOPY     HERNIA REPAIR Right 2010   Dr. Rich Brave HERNIA REPAIR Bilateral 03/14/2018   Procedure: LAPAROSCOPIC BILATERAL  INGUINAL HERNIA REPAIR  WITH MESH ERAS PAHTWAY;  Surgeon: Karie Soda, MD;  Location: [facility];  Service: General;  Laterality: Bilateral;   Surgery for spont pneumothorax Right 1996   UMBILICAL HERNIA REPAIR N/A 03/14/2018   Procedure: HERNIA REPAIR UMBILICAL ADULT;  Surgeon: Karie Soda, MD;  Location: [facility];  Service: General;   Laterality: N/A;   WISDOM TOOTH EXTRACTION      reports that he has never smoked. He has never been exposed to tobacco smoke. He has never used smokeless tobacco. He reports current alcohol use of about 3.0 standard drinks of alcohol per week. He reports current drug use. Drug: Marijuana. family history includes Stroke (age of onset: 76) in his maternal grandmother; Urolithiasis in his father. Allergies  Allergen Reactions   Levofloxacin Other (See Comments)    GI problems   Current Outpatient Medications on File Prior to Visit  Medication Sig Dispense Refill   fluticasone furoate-vilanterol (BREO ELLIPTA) 200-25 MCG/ACT AEPB Inhale 1 puff into the lungs daily. 60 each 6   mesalamine (LIALDA) 1.2 g EC tablet TAKE 2 TABLETS BY MOUTH TWICE A DAY 360 tablet 3   Probiotic Product (PROBIOTIC PO) Take 1 capsule by mouth daily.     No current facility-administered medications on file prior to visit.    Observations/Objective: Alert, NAD, appropriate mood and affect, resps normal, cn 2-12 intact, moves all 4s, no visible rash or swelling Lab Results  Component Value Date   WBC 9.1 01/25/2023   HGB 15.4 01/25/2023   HCT 45.9 01/25/2023   PLT 280.0 01/25/2023   GLUCOSE 95 01/25/2023   CHOL 248 (H) 01/25/2023   TRIG 179.0 (H) 01/25/2023   HDL 32.80 (L) 01/25/2023   LDLDIRECT 150.0 06/22/2022   LDLCALC 180 (H) 01/25/2023   ALT 21 01/25/2023   AST 19 01/25/2023   NA  141 01/25/2023   K 4.3 01/25/2023   CL 103 01/25/2023   CREATININE 0.94 01/25/2023   BUN 14 01/25/2023   CO2 27 01/25/2023   TSH 1.95 01/25/2023   PSA 1.98 01/25/2023   HGBA1C 6.0 01/25/2023   MICROALBUR <0.7 01/25/2023   Assessment and Plan: See notes  Follow Up Instructions: See notes   I discussed the assessment and treatment plan with the patient. The patient was provided an opportunity to ask questions and all were answered. The patient agreed with the plan and demonstrated an understanding of the  instructions.   The patient was advised to call back or seek an in-person evaluation if the symptoms worsen or if the condition fails to improve as anticipated.  Oliver Barre, MD

## 2023-01-26 ENCOUNTER — Encounter: Payer: Self-pay | Admitting: Internal Medicine

## 2023-01-26 LAB — BASIC METABOLIC PANEL
BUN: 14 mg/dL (ref 6–23)
CO2: 27 meq/L (ref 19–32)
Calcium: 9.4 mg/dL (ref 8.4–10.5)
Chloride: 103 meq/L (ref 96–112)
Creatinine, Ser: 0.94 mg/dL (ref 0.40–1.50)
GFR: 95.79 mL/min (ref >60.00)
Glucose, Bld: 95 mg/dL (ref 70–99)
Potassium: 4.3 meq/L (ref 3.5–5.1)
Sodium: 141 meq/L (ref 135–145)

## 2023-01-26 LAB — LIPID PANEL
Cholesterol: 248 mg/dL — ABNORMAL HIGH (ref 0–200)
HDL: 32.8 mg/dL — ABNORMAL LOW (ref >39.00)
LDL Cholesterol: 180 mg/dL — ABNORMAL HIGH (ref 0–99)
NonHDL: 215.45
Total CHOL/HDL Ratio: 8
Triglycerides: 179 mg/dL — ABNORMAL HIGH (ref 0.0–149.0)
VLDL: 35.8 mg/dL (ref 0.0–40.0)

## 2023-01-26 LAB — URINALYSIS, ROUTINE W REFLEX MICROSCOPIC
Bilirubin Urine: NEGATIVE
Ketones, ur: NEGATIVE
Leukocytes,Ua: NEGATIVE
Nitrite: NEGATIVE
Specific Gravity, Urine: <=1.005 — AB (ref 1.000–1.030)
Total Protein, Urine: NEGATIVE
Urine Glucose: NEGATIVE
Urobilinogen, UA: 0.2 (ref 0.0–1.0)
WBC, UA: NONE SEEN (ref 0–?)
pH: 6 (ref 5.0–8.0)

## 2023-01-26 LAB — VITAMIN B12: Vitamin B-12: 734 pg/mL (ref 211–911)

## 2023-01-26 LAB — HEPATIC FUNCTION PANEL
ALT: 21 U/L (ref 0–53)
AST: 19 U/L (ref 0–37)
Albumin: 4.7 g/dL (ref 3.5–5.2)
Alkaline Phosphatase: 80 U/L (ref 39–117)
Bilirubin, Direct: 0.1 mg/dL (ref 0.0–0.3)
Total Bilirubin: 0.3 mg/dL (ref 0.2–1.2)
Total Protein: 7.1 g/dL (ref 6.0–8.3)

## 2023-01-26 LAB — TSH: TSH: 1.95 u[IU]/mL (ref 0.35–5.50)

## 2023-01-26 LAB — HEMOGLOBIN A1C: Hgb A1c MFr Bld: 6 % (ref 4.6–6.5)

## 2023-01-26 LAB — VITAMIN D 25 HYDROXY (VIT D DEFICIENCY, FRACTURES): VITD: 33.4 ng/mL (ref 30.00–100.00)

## 2023-01-26 LAB — PSA: PSA: 1.98 ng/mL (ref 0.10–4.00)

## 2023-01-29 ENCOUNTER — Ambulatory Visit (INDEPENDENT_AMBULATORY_CARE_PROVIDER_SITE_OTHER): Payer: 59

## 2023-01-29 ENCOUNTER — Ambulatory Visit (INDEPENDENT_AMBULATORY_CARE_PROVIDER_SITE_OTHER): Payer: 59 | Admitting: Podiatry

## 2023-01-29 ENCOUNTER — Encounter: Payer: Self-pay | Admitting: Podiatry

## 2023-01-29 ENCOUNTER — Encounter: Payer: Self-pay | Admitting: Internal Medicine

## 2023-01-29 DIAGNOSIS — M722 Plantar fascial fibromatosis: Secondary | ICD-10-CM

## 2023-01-29 DIAGNOSIS — M898X9 Other specified disorders of bone, unspecified site: Secondary | ICD-10-CM

## 2023-01-29 DIAGNOSIS — R1032 Left lower quadrant pain: Secondary | ICD-10-CM | POA: Insufficient documentation

## 2023-01-29 NOTE — Telephone Encounter (Signed)
Dr. Judeth Horn, can you review and advise if okay to cancel the HRCT?   I am sending to you since this is a former Thora Lance pt who has also seen you previously   Thank you!

## 2023-01-29 NOTE — Patient Instructions (Signed)
Please continue all other medications as before, and refills have been done if requested.  Please have the pharmacy call with any other refills you may need.  Please keep your appointments with your specialists as you may have planned  You will be contacted regarding the referral for: CT abd /pelvis  Please go to the LAB at the blood drawing area for the tests to be done  You will be contacted by phone if any changes need to be made immediately.  Otherwise, you will receive a letter about your results with an explanation, but please check with MyChart first.

## 2023-01-29 NOTE — Assessment & Plan Note (Signed)
Controlled, for tums prn

## 2023-01-29 NOTE — Patient Instructions (Addendum)
Look for Voltaren gel at the pharmacy over the counter or online (also known as diclofenac 1% gel). Apply to the painful areas 3-4x daily with the supplied dosing card. Allow to dry for 10 minutes before going into socks/shoes   VISIT SUMMARY:  You came in today due to increasing foot pain, particularly in your left foot, which you described as having a lump. You also mentioned a history of hernia problems and gastric reflux. We discussed your work environment and footwear preferences, as well as your family history of gout.  YOUR PLAN:  -PLANTAR FASCIITIS: Plantar fasciitis is a condition that causes pain in the heel and bottom of the foot due to inflammation of the plantar fascia. We recommend using Voltaren gel on the affected area three to four times daily and doing stretching exercises twice daily. You should also consider custom orthotics and schedule an appointment with an orthotist in one month. If the pain worsens or your foot becomes swollen and tender, we may consider a steroid injection.  -FOOTWEAR: Your current work shoes are causing discomfort, and you prefer to be barefoot on hard tile floors. It's important to find comfortable, supportive footwear for work. Custom orthotics may also help alleviate your foot pain.    INSTRUCTIONS:  Please follow up with an orthotist in one month for custom orthotics. If your foot pain worsens or you notice swelling and tenderness, contact us immediately as we may need to consider a steroid injection. Continue managing your gastric reflux with your current strategies and avoid oral NSAIDs.  Plantar Fasciitis (Heel Spur Syndrome) with Rehab The plantar fascia is a fibrous, ligament-like, soft-tissue structure that spans the bottom of the foot. Plantar fasciitis is a condition that causes pain in the foot due to inflammation of the tissue. SYMPTOMS  Pain and tenderness on the underneath side of the foot. Pain that worsens with standing or  walking. CAUSES  Plantar fasciitis is caused by irritation and injury to the plantar fascia on the underneath side of the foot. Common mechanisms of injury include: Direct trauma to bottom of the foot. Damage to a small nerve that runs under the foot where the main fascia attaches to the heel bone. Stress placed on the plantar fascia due to bone spurs. RISK INCREASES WITH:  Activities that place stress on the plantar fascia (running, jumping, pivoting, or cutting). Poor strength and flexibility. Improperly fitted shoes. Tight calf muscles. Flat feet. Failure to warm-up properly before activity. Obesity. PREVENTION Warm up and stretch properly before activity. Allow for adequate recovery between workouts. Maintain physical fitness: Strength, flexibility, and endurance. Cardiovascular fitness. Maintain a health body weight. Avoid stress on the plantar fascia. Wear properly fitted shoes, including arch supports for individuals who have flat feet.  PROGNOSIS  If treated properly, then the symptoms of plantar fasciitis usually resolve without surgery. However, occasionally surgery is necessary.  RELATED COMPLICATIONS  Recurrent symptoms that may result in a chronic condition. Problems of the lower back that are caused by compensating for the injury, such as limping. Pain or weakness of the foot during push-off following surgery. Chronic inflammation, scarring, and partial or complete fascia tear, occurring more often from repeated injections.  TREATMENT  Treatment initially involves the use of ice and medication to help reduce pain and inflammation. The use of strengthening and stretching exercises may help reduce pain with activity, especially stretches of the Achilles tendon. These exercises may be performed at home or with a therapist. Your caregiver may recommend that you  use heel cups of arch supports to help reduce stress on the plantar fascia. Occasionally, corticosteroid  injections are given to reduce inflammation. If symptoms persist for greater than 6 months despite non-surgical (conservative), then surgery may be recommended.   MEDICATION  If pain medication is necessary, then nonsteroidal anti-inflammatory medications, such as aspirin and ibuprofen, or other minor pain relievers, such as acetaminophen, are often recommended. Do not take pain medication within 7 days before surgery. Prescription pain relievers may be given if deemed necessary by your caregiver. Use only as directed and only as much as you need. Corticosteroid injections may be given by your caregiver. These injections should be reserved for the most serious cases, because they may only be given a certain number of times.  HEAT AND COLD Cold treatment (icing) relieves pain and reduces inflammation. Cold treatment should be applied for 10 to 15 minutes every 2 to 3 hours for inflammation and pain and immediately after any activity that aggravates your symptoms. Use ice packs or massage the area with a piece of ice (ice massage). Heat treatment may be used prior to performing the stretching and strengthening activities prescribed by your caregiver, physical therapist, or athletic trainer. Use a heat pack or soak the injury in warm water.  SEEK IMMEDIATE MEDICAL CARE IF: Treatment seems to offer no benefit, or the condition worsens. Any medications produce adverse side effects.  EXERCISES- RANGE OF MOTION (ROM) AND STRETCHING EXERCISES - Plantar Fasciitis (Heel Spur Syndrome) These exercises may help you when beginning to rehabilitate your injury. Your symptoms may resolve with or without further involvement from your physician, physical therapist or athletic trainer. While completing these exercises, remember:  Restoring tissue flexibility helps normal motion to return to the joints. This allows healthier, less painful movement and activity. An effective stretch should be held for at least 30  seconds. A stretch should never be painful. You should only feel a gentle lengthening or release in the stretched tissue.  RANGE OF MOTION - Toe Extension, Flexion Sit with your right / left leg crossed over your opposite knee. Grasp your toes and gently pull them back toward the top of your foot. You should feel a stretch on the bottom of your toes and/or foot. Hold this stretch for 10 seconds. Now, gently pull your toes toward the bottom of your foot. You should feel a stretch on the top of your toes and or foot. Hold this stretch for 10 seconds. Repeat  times. Complete this stretch 3 times per day.   RANGE OF MOTION - Ankle Dorsiflexion, Active Assisted Remove shoes and sit on a chair that is preferably not on a carpeted surface. Place right / left foot under knee. Extend your opposite leg for support. Keeping your heel down, slide your right / left foot back toward the chair until you feel a stretch at your ankle or calf. If you do not feel a stretch, slide your bottom forward to the edge of the chair, while still keeping your heel down. Hold this stretch for 10 seconds. Repeat 3 times. Complete this stretch 2 times per day.   STRETCH  Gastroc, Standing Place hands on wall. Extend right / left leg, keeping the front knee somewhat bent. Slightly point your toes inward on your back foot. Keeping your right / left heel on the floor and your knee straight, shift your weight toward the wall, not allowing your back to arch. You should feel a gentle stretch in the right / left calf.  Hold this position for 10 seconds. Repeat 3 times. Complete this stretch 2 times per day.  STRETCH  Soleus, Standing Place hands on wall. Extend right / left leg, keeping the other knee somewhat bent. Slightly point your toes inward on your back foot. Keep your right / left heel on the floor, bend your back knee, and slightly shift your weight over the back leg so that you feel a gentle stretch deep in your  back calf. Hold this position for 10 seconds. Repeat 3 times. Complete this stretch 2 times per day.  STRETCH  Gastrocsoleus, Standing  Note: This exercise can place a lot of stress on your foot and ankle. Please complete this exercise only if specifically instructed by your caregiver.  Place the ball of your right / left foot on a step, keeping your other foot firmly on the same step. Hold on to the wall or a rail for balance. Slowly lift your other foot, allowing your body weight to press your heel down over the edge of the step. You should feel a stretch in your right / left calf. Hold this position for 10 seconds. Repeat this exercise with a slight bend in your right / left knee. Repeat 3 times. Complete this stretch 2 times per day.   STRENGTHENING EXERCISES - Plantar Fasciitis (Heel Spur Syndrome)  These exercises may help you when beginning to rehabilitate your injury. They may resolve your symptoms with or without further involvement from your physician, physical therapist or athletic trainer. While completing these exercises, remember:  Muscles can gain both the endurance and the strength needed for everyday activities through controlled exercises. Complete these exercises as instructed by your physician, physical therapist or athletic trainer. Progress the resistance and repetitions only as guided.  STRENGTH - Towel Curls Sit in a chair positioned on a non-carpeted surface. Place your foot on a towel, keeping your heel on the floor. Pull the towel toward your heel by only curling your toes. Keep your heel on the floor. Repeat 3 times. Complete this exercise 2 times per day.  STRENGTH - Ankle Inversion Secure one end of a rubber exercise band/tubing to a fixed object (table, pole). Loop the other end around your foot just before your toes. Place your fists between your knees. This will focus your strengthening at your ankle. Slowly, pull your big toe up and in, making sure the  band/tubing is positioned to resist the entire motion. Hold this position for 10 seconds. Have your muscles resist the band/tubing as it slowly pulls your foot back to the starting position. Repeat 3 times. Complete this exercises 2 times per day.  Document Released: 12/19/2004 Document Revised: 03/13/2011 Document Reviewed: 04/02/2008 [facility] Patient Information 2014 [place], [place].

## 2023-01-29 NOTE — Telephone Encounter (Signed)
Ok to cancel HRCT, can re-consider if he feels it is necessary in future.

## 2023-01-29 NOTE — Assessment & Plan Note (Signed)
Worsening, for labs including cbc and CT abd pelvis, for general sugury and urology as referred

## 2023-01-29 NOTE — Assessment & Plan Note (Signed)
Lab Results  Component Value Date   HGBA1C 6.0 01/25/2023   Stable, pt to continue current medical treatment  - diet, wt control

## 2023-02-22 ENCOUNTER — Ambulatory Visit
Admission: RE | Admit: 2023-02-22 | Discharge: 2023-02-22 | Disposition: A | Discharge status: Home / Self Care | Payer: 59 | Source: Ambulatory Visit | Attending: Internal Medicine | Admitting: Internal Medicine

## 2023-02-22 ENCOUNTER — Inpatient Hospital Stay: Admission: RE | Admit: 2023-02-22 | Payer: 59 | Source: Ambulatory Visit

## 2023-02-22 DIAGNOSIS — R1032 Left lower quadrant pain: Secondary | ICD-10-CM

## 2023-02-22 NOTE — Progress Notes (Signed)
Left testicular swelling x1 year

## 2023-02-25 ENCOUNTER — Encounter: Payer: Self-pay | Admitting: Internal Medicine

## 2023-02-26 NOTE — Progress Notes (Unsigned)
 [facility] Return Visit   Referring Provider Corwin Levins, MD [address] [place],  [place] [address]  Primary Care Provider Corwin Levins, MD  Patient Profile: Curtis [name] is a 48 y.o. male who returns to the [facility] for follow-up of the problem(s) noted below.  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 Russella Dar   Current GI Meds  Lialda 2.4 g p.o. twice daily  Interval History    Last colonoscopy: 05/2016 -diffuse moderate inflammation in the rectosigmoid colon, o/w normal Last endoscopy: None  Last Abd CT/CTE/MRE: 02/2023 -constipation, diverticulosis without diverticulitis, suspected hydrocele  GI Review of Symptoms Significant for {GIROS:50592}. Otherwise negative.  General Review of Systems  Review of systems is significant for the pertinent positives and negatives as listed per the HPI.  Full ROS is otherwise negative.  Past Medical History   Past Medical History:  Diagnosis Date   ACNE, CYSTIC    resolved   Arthritis    spine, early onset   IBS (irritable bowel syndrome)    INGUINAL HERNIA, RIGHT 04/24/2008   s/p RIH repair   Lactose intolerance    diet controlled   PONV (postoperative nausea and vomiting)    Prostatitis      Past Surgical History   Past Surgical History:  Procedure Laterality Date   COLONOSCOPY     HERNIA REPAIR Right 2010   Dr. Rich Brave HERNIA REPAIR Bilateral 03/14/2018   Procedure: LAPAROSCOPIC BILATERAL  INGUINAL HERNIA REPAIR  WITH MESH ERAS PAHTWAY;  Surgeon: Karie Soda, MD;  Location: [facility];  Service: General;  Laterality: Bilateral;   Surgery for spont pneumothorax Right 1996   UMBILICAL HERNIA REPAIR N/A 03/14/2018   Procedure:  HERNIA REPAIR UMBILICAL ADULT;  Surgeon: Karie Soda, MD;  Location: [facility];  Service: General;  Laterality: N/A;   WISDOM TOOTH EXTRACTION       Allergies and Medications   Allergies  Allergen Reactions   Levofloxacin Other (See Comments)    GI problems    @MEDSTODAY @  Family History   Family History  Problem Relation Age of Onset   Urolithiasis Father        Had kidney removed at age 64   Stroke Maternal Grandmother 58   Colon cancer Neg Hx    Esophageal cancer Neg Hx    Pancreatic cancer Neg Hx    Prostate cancer Neg Hx    Rectal cancer Neg Hx    Stomach cancer Neg Hx    GI Specific Family History: {gifamhx:50061}   Social History   Social History   Tobacco Use   Smoking status: Never    Passive exposure: Never   Smokeless tobacco: Never  Vaping Use   Vaping status: Former  Substance Use Topics   Alcohol use: Yes    Alcohol/week: 3.0 standard drinks of alcohol    Types: 3 Cans of beer per week    Comment: rarely   Drug use: Yes    Types: Marijuana    Comment: daily use   Ramal reports that he has never smoked. He has never been exposed to tobacco smoke. He has never used smokeless tobacco. He reports current alcohol use of about 3.0 standard drinks of alcohol per week. He reports  current drug use. Drug: Marijuana.  Vital Signs and Physical Examination  There were no vitals filed for this visit. There is no height or weight on file to calculate BMI.    General: Well developed, well nourished, no acute distress Head: Normocephalic and atraumatic Eyes: Sclerae anicteric, EOMI Ears: Normal auditory acuity Mouth: No deformities or lesions noted Lungs: Clear throughout to auscultation Heart: Regular rate and rhythm; No murmurs, rubs or bruits Abdomen: Soft, non tender and non distended. No masses, hepatosplenomegaly or hernias noted. Normal Bowel sounds Rectal: Musculoskeletal: Symmetrical with no gross deformities  Pulses:  Normal  pulses noted Extremities: No edema or deformities noted Neurological: Alert oriented x 4, grossly nonfocal Psychological:  Alert and cooperative. Normal mood and affect   Review of Data  The following data was reviewed at the time of this encounter:  Laboratory Studies      Latest Ref Rng & Units 01/25/2023    4:53 PM 06/22/2022    4:27 PM 01/26/2021    2:06 PM  CBC  WBC 4.0 - 10.5 K/uL 9.1  9.1  10.2   Hemoglobin 13.0 - 17.0 g/dL 43.3  29.5  18.8   Hematocrit 39.0 - 52.0 % 45.9  45.1  46.2   Platelets 150.0 - 400.0 K/uL 280.0  285.0  298.0     No results found for: "LIPASE"    Latest Ref Rng & Units 01/25/2023    4:53 PM 06/22/2022    4:27 PM 01/26/2021    2:06 PM  CMP  Glucose 70 - 99 mg/dL 95  94  94   BUN 6 - 23 mg/dL 14  15  20    Creatinine 0.40 - 1.50 mg/dL 4.16  6.06  3.01   Sodium 135 - 145 mEq/L 141  139  138   Potassium 3.5 - 5.1 mEq/L 4.3  3.8  4.0   Chloride 96 - 112 mEq/L 103  101  98   CO2 19 - 32 mEq/L 27  24  29    Calcium 8.4 - 10.5 mg/dL 9.4  9.4  9.8   Total Protein 6.0 - 8.3 g/dL 7.1  7.3  7.8   Total Bilirubin 0.2 - 1.2 mg/dL 0.3  0.4  0.4   Alkaline Phos 39 - 117 U/L 80  81  87   AST 0 - 37 U/L 19  17  18    ALT 0 - 53 U/L 21  27  25       Imaging Studies  CTAP 02/2023 1. Colonic stool burden compatible with constipation. 2. Scattered colonic diverticulosis without findings of acute diverticulitis. 3. Partially visualized 5.8 cm fluid collection in the left hemiscrotum, possibly reflecting a hydrocele. Suggest further evaluation with scrotal ultrasound.    GI Procedures and Studies  Colonoscopy 05/2016 Diffuse moderate inflammation in the rectosigmoid colon, o/w normal  Colonoscopy 04/2013 Normal colon and terminal ileum   Clinical Impression  It is my clinical impression that Curtis [name] is a 48 y.o. male with;  ***  Plan  *** *** *** *** ***   Planned Follow Up No follow-ups on file.  The patient or caregiver verbalized  understanding of the material covered, with no barriers to understanding. All questions were answered. Patient or caregiver is agreeable with the plan outlined above.    It was a pleasure to see Kerry.  If you have any questions or concerns regarding this evaluation, do not hesitate to contact me.  Maren Beach, MD [facility]

## 2023-02-27 ENCOUNTER — Encounter: Payer: Self-pay | Admitting: Pediatrics

## 2023-02-27 ENCOUNTER — Ambulatory Visit (INDEPENDENT_AMBULATORY_CARE_PROVIDER_SITE_OTHER): Payer: 59 | Admitting: Pediatrics

## 2023-02-27 VITALS — BP 130/80 | HR 82 | Ht 72.0 in | Wt 170.0 lb

## 2023-02-27 DIAGNOSIS — R102 Pelvic and perineal pain unspecified side: Secondary | ICD-10-CM

## 2023-02-27 DIAGNOSIS — K580 Irritable bowel syndrome with diarrhea: Secondary | ICD-10-CM

## 2023-02-27 DIAGNOSIS — E739 Lactose intolerance, unspecified: Secondary | ICD-10-CM | POA: Diagnosis not present

## 2023-02-27 DIAGNOSIS — K641 Second degree hemorrhoids: Secondary | ICD-10-CM

## 2023-02-27 DIAGNOSIS — K642 Third degree hemorrhoids: Secondary | ICD-10-CM

## 2023-02-27 DIAGNOSIS — K51318 Ulcerative (chronic) rectosigmoiditis with other complication: Secondary | ICD-10-CM | POA: Diagnosis not present

## 2023-02-27 DIAGNOSIS — K5730 Diverticulosis of large intestine without perforation or abscess without bleeding: Secondary | ICD-10-CM

## 2023-02-27 DIAGNOSIS — R4582 Worries: Secondary | ICD-10-CM

## 2023-02-27 DIAGNOSIS — K5130 Ulcerative (chronic) rectosigmoiditis without complications: Secondary | ICD-10-CM

## 2023-02-27 NOTE — Patient Instructions (Addendum)
 _______________________________________________________  If your blood pressure at your visit was 140/90 or greater, please contact your primary care physician to follow up on this.  _______________________________________________________  If you are age 49 or older, your body mass index should be between 23-30. Your Body mass index is 23.06 kg/m. If this is out of the aforementioned range listed, please consider follow up with your Primary Care Provider.  If you are age 38 or younger, your body mass index should be between 19-25. Your Body mass index is 23.06 kg/m. If this is out of the aformentioned range listed, please consider follow up with your Primary Care Provider.   ________________________________________________________  The [facility] providers would like to encourage you to use [facility] to communicate with providers for non-urgent requests or questions.  Due to long hold times on the telephone, sending your provider a message by [facility] may be a faster and more efficient way to get a response.  Please allow 48 business hours for a response.  Please remember that this is for non-urgent requests.  _______________________________________________________   Bonita Quin have been scheduled for a colonoscopy. Please follow written instructions given to you at your visit today.   If you use inhalers (even only as needed), please bring them with you on the day of your procedure.  DO NOT TAKE 7 DAYS PRIOR TO TEST- Trulicity (dulaglutide) Ozempic, Wegovy (semaglutide) Mounjaro (tirzepatide) Bydureon Bcise (exanatide extended release)  DO NOT TAKE 1 DAY PRIOR TO YOUR TEST Rybelsus (semaglutide) Adlyxin (lixisenatide) Victoza (liraglutide) Byetta (exanatide) ___________________________________________________________________________  It was a pleasure to see you today!  Thank you for trusting me with your gastrointestinal care!

## 2023-03-05 NOTE — Progress Notes (Unsigned)
 [facility] History and Physical   Primary Care Physician:  Corwin Levins, MD   Reason for Procedure:  Follow-up of ulcerative proctosigmoiditis, rectal discomfort, concern for hemorrhoids  Plan:    Colonoscopy     HPI: Curtis [name] is a 48 y.o. male undergoing colonoscopy for further evaluation of ulcerative proctosigmoiditis diagnosed in 2018, rectal discomfort and concern for hemorrhoids. With respect to his ulcerative proctosigmoiditis he notes fecal urgency, change in bowel caliber and incomplete defecation.  He is not noting blood in his stool.  He has not had updated endoscopic evaluation since initial diagnosis.  He is also experiencing anorectal discomfort queries if this is related to hemorrhoids.  He is interested in having either hemorrhoidal banding or surgery performed.   Past Medical History:  Diagnosis Date   ACNE, CYSTIC    resolved   Arthritis    spine, early onset   IBS (irritable bowel syndrome)    Inguinal hernia    INGUINAL HERNIA, RIGHT 04/24/2008   s/p RIH repair   Lactose intolerance    diet controlled   PONV (postoperative nausea and vomiting)    Prostatitis     Past Surgical History:  Procedure Laterality Date   COLONOSCOPY  2018   HERNIA REPAIR Right 2010   Dr. Rich Brave HERNIA REPAIR Bilateral 03/14/2018   Procedure: LAPAROSCOPIC BILATERAL  INGUINAL HERNIA REPAIR  WITH MESH ERAS PAHTWAY;  Surgeon: Karie Soda, MD;  Location: [facility];  Service: General;  Laterality: Bilateral;   Surgery for spont pneumothorax Right 1996   UMBILICAL HERNIA REPAIR N/A 03/14/2018   Procedure: HERNIA REPAIR UMBILICAL ADULT;  Surgeon: Karie Soda, MD;  Location: [facility];  Service: General;  Laterality: N/A;   WISDOM TOOTH EXTRACTION      Prior to Admission medications   Medication Sig Start Date End Date Taking? Authorizing Provider  fluticasone furoate-vilanterol (BREO ELLIPTA) 200-25  MCG/ACT AEPB Inhale 1 puff into the lungs daily. 07/04/22   Hunsucker, Lesia Sago, MD  mesalamine (LIALDA) 1.2 g EC tablet TAKE 2 TABLETS BY MOUTH TWICE A DAY 03/06/22   Meryl Dare, MD  Probiotic Product (PROBIOTIC PO) Take 1 capsule by mouth daily.    [provider]    Current Outpatient Medications  Medication Sig Dispense Refill   fluticasone furoate-vilanterol (BREO ELLIPTA) 200-25 MCG/ACT AEPB Inhale 1 puff into the lungs daily. 60 each 6   mesalamine (LIALDA) 1.2 g EC tablet TAKE 2 TABLETS BY MOUTH TWICE A DAY 360 tablet 3   Probiotic Product (PROBIOTIC PO) Take 1 capsule by mouth daily.     Current Facility-Administered Medications  Medication Dose Route Frequency Provider Last Rate Last Admin   0.9 %  sodium chloride infusion  500 mL Intravenous Continuous Treniece Holsclaw, Durene Romans, MD        Allergies as of 03/06/2023 - Review Complete 03/06/2023  Allergen Reaction Noted   Levofloxacin Other (See Comments) 06/17/2019    Family History  Problem Relation Age of Onset   Urolithiasis Father        Had kidney removed at age 15   Stroke Maternal Grandmother 75   Colon cancer Neg Hx    Esophageal cancer Neg Hx    Pancreatic cancer Neg Hx    Prostate cancer Neg Hx    Rectal cancer Neg Hx    Stomach cancer Neg Hx     Social History   Socioeconomic History   Marital status: Single  Spouse name: Not on file   Number of children: 0   Years of education: 15   Highest education level: Not on file  Occupational History   Occupation: copy room    Employer: [employer]  Tobacco Use   Smoking status: Never    Passive exposure: Never   Smokeless tobacco: Never  Vaping Use   Vaping status: Former  Substance and Sexual Activity   Alcohol use: Yes    Alcohol/week: 3.0 standard drinks of alcohol    Types: 3 Cans of beer per week    Comment: rarely   Drug use: Yes    Types: Marijuana    Comment: daily use   Sexual activity: Not on file  Other Topics Concern   Not on file   Social History Narrative   Fun: Play basketball,golf, binge watching    Social Drivers of Corporate investment banker Strain: Not on file  Food Insecurity: Not on file  Transportation Needs: Not on file  Physical Activity: Not on file  Stress: Not on file  Social Connections: Not on file  Intimate Partner Violence: Not on file    Review of Systems:  All other review of systems negative except as mentioned in the HPI.  Physical Exam: Vital signs BP 122/74   Pulse 60   Temp 98.8 F (37.1 C) (Temporal)   Ht 6' (1.829 m)   Wt 170 lb (77.1 kg)   SpO2 97%   BMI 23.06 kg/m   General:   Alert,  Well-developed, well-nourished, pleasant and cooperative in NAD Airway:  Mallampati 2 Lungs:  Clear throughout to auscultation.   Heart:  Regular rate and rhythm; no murmurs, clicks, rubs,  or gallops. Abdomen:  Soft, nontender and nondistended. Normal bowel sounds.   Neuro/Psych:  Normal mood and affect. A and O x 3  Maren Beach, MD [facility]

## 2023-03-06 ENCOUNTER — Ambulatory Visit: Payer: 59 | Admitting: Pediatrics

## 2023-03-06 ENCOUNTER — Encounter: Payer: Self-pay | Admitting: Pediatrics

## 2023-03-06 VITALS — BP 137/87 | HR 62 | Temp 98.8°F | Resp 11 | Ht 72.0 in | Wt 170.0 lb

## 2023-03-06 DIAGNOSIS — K6289 Other specified diseases of anus and rectum: Secondary | ICD-10-CM | POA: Diagnosis not present

## 2023-03-06 DIAGNOSIS — R102 Pelvic and perineal pain: Secondary | ICD-10-CM | POA: Diagnosis not present

## 2023-03-06 DIAGNOSIS — K5130 Ulcerative (chronic) rectosigmoiditis without complications: Secondary | ICD-10-CM | POA: Diagnosis not present

## 2023-03-06 DIAGNOSIS — K642 Third degree hemorrhoids: Secondary | ICD-10-CM

## 2023-03-06 DIAGNOSIS — K648 Other hemorrhoids: Secondary | ICD-10-CM

## 2023-03-06 DIAGNOSIS — K649 Unspecified hemorrhoids: Secondary | ICD-10-CM | POA: Diagnosis not present

## 2023-03-06 DIAGNOSIS — Z1211 Encounter for screening for malignant neoplasm of colon: Secondary | ICD-10-CM | POA: Diagnosis present

## 2023-03-06 DIAGNOSIS — K644 Residual hemorrhoidal skin tags: Secondary | ICD-10-CM

## 2023-03-06 DIAGNOSIS — K5730 Diverticulosis of large intestine without perforation or abscess without bleeding: Secondary | ICD-10-CM

## 2023-03-06 DIAGNOSIS — R195 Other fecal abnormalities: Secondary | ICD-10-CM

## 2023-03-06 DIAGNOSIS — R152 Fecal urgency: Secondary | ICD-10-CM

## 2023-03-06 DIAGNOSIS — K580 Irritable bowel syndrome with diarrhea: Secondary | ICD-10-CM

## 2023-03-06 MED ORDER — SODIUM CHLORIDE 0.9 % IV SOLN: 500.0000 mL | INTRAVENOUS | Status: DC

## 2023-03-06 NOTE — Op Note (Signed)
  [facility] Patient Name: Curtis [name] Procedure Date: 03/06/2023 4:21 PM MRN: [id number] Endoscopist: Maren Beach , MD, [id number] Age: 48 Referring MD:  Date of Birth: 1975/03/23 Gender: Male Account #: [id number] Procedure:                Colonoscopy Indications:              High risk colon cancer surveillance: Ulcerative                            proctosigmoiditis, Last colonoscopy: May 2018,                            Incidental - Change in stool caliber, fecal urgency                            and incomplete evacuation with defecation. Patient                            is on Lialda 2.4g po BID at the time of this exam. Medicines:                Monitored Anesthesia Care Procedure:                Pre-Anesthesia Assessment:                           - Prior to the procedure, a History and Physical                            was performed, and patient medications and                            allergies were reviewed. The patient's tolerance of                            previous anesthesia was also reviewed. The risks                            and benefits of the procedure and the sedation                            options and risks were discussed with the patient.                            All questions were answered, and informed consent                            was obtained. Prior Anticoagulants: The patient has                            taken no anticoagulant or antiplatelet agents. ASA                            Grade Assessment: II - A patient with mild systemic  disease. After reviewing the risks and benefits,                            the patient was deemed in satisfactory condition to                            undergo the procedure.                           After obtaining informed consent, the colonoscope                            was passed under direct vision. Throughout the                            procedure, the  patient's blood pressure, pulse, and                            oxygen saturations were monitored continuously. The                            Olympus Scope [id number] was introduced through the                            anus and advanced to the terminal ileum. The                            colonoscopy was performed without difficulty. The                            patient tolerated the procedure well. The quality                            of the bowel preparation was good. The terminal                            ileum, ileocecal valve, appendiceal orifice, and                            rectum were photographed. Scope In: 4:31:48 PM Scope Out: 4:49:40 PM Scope Withdrawal Time: 0 hours 12 minutes 59 seconds  Total Procedure Duration: 0 hours 17 minutes 52 seconds  Findings:                 Skin tags were found on perianal exam.                           Hemorrhoids were found on perianal exam.                           The digital rectal exam was normal. Pertinent                            negatives include normal sphincter tone and no  palpable rectal lesions.                           Normal mucosa was found in the entire colon.                            Biopsies were taken with a cold forceps for                            histology from the right and left colon.                           Four biopsies were taken every 10 cm with a cold                            forceps from the rectosigmoid colon for ulcerative                            colitis surveillance. These biopsy specimens were                            sent to Pathology.                           The terminal ileum appeared normal.                           Internal hemorrhoids were found during retroflexion.                           Anal papilla(e) were hypertrophied.                           Multiple small-mouthed diverticula were found in                            the sigmoid colon,  descending colon and transverse                            colon. Complications:            No immediate complications. Estimated blood loss:                            Minimal. Estimated Blood Loss:     Estimated blood loss was minimal. Impression:               - Perianal skin tags found on perianal exam.                           - Hemorrhoids found on perianal exam.                           - Normal mucosa in the entire examined colon.  Biopsied. Ulcerative proctosigmoiditis in                            endoscopic remission.                           - The examined portion of the ileum was normal.                           - Internal hemorrhoids.                           - Anal papilla(e) were hypertrophied.                           - Diverticulosis in the sigmoid colon, in the                            descending colon and in the transverse colon.                           - Biopsies for surveillance were taken from the                            rectosigmoid colon. Recommendation:           - Discharge patient to home (ambulatory).                           - Await pathology results.                           - Repeat colonoscopy for surveillance based on                            pathology results.                           - The findings and recommendations were discussed                            with the patient's family.                           - Return to GI clinic as previously scheduled.                           - Patient has a contact number available for                            emergencies. The signs and symptoms of potential                            delayed complications were discussed with the                            patient. Return to normal activities tomorrow.  Written discharge instructions were provided to the                            patient. Maren Beach, MD 03/06/2023 4:57:22 PM This  report has been signed electronically.

## 2023-03-06 NOTE — Patient Instructions (Signed)

## 2023-03-06 NOTE — Progress Notes (Signed)
 Pt's states no medical or surgical changes since previsit or office visit.

## 2023-03-06 NOTE — Progress Notes (Signed)
 Vss nad trans to pacu

## 2023-03-07 ENCOUNTER — Telehealth: Payer: Self-pay

## 2023-03-07 NOTE — Telephone Encounter (Signed)
  Follow up Call-     03/06/2023    3:21 PM  Call back number  Post procedure Call Back phone  # [phone]  Permission to leave phone message Yes    Follow up call, LVM

## 2023-03-09 LAB — SURGICAL PATHOLOGY

## 2023-03-11 ENCOUNTER — Encounter: Payer: Self-pay | Admitting: Pediatrics

## 2023-03-12 ENCOUNTER — Telehealth: Payer: Self-pay

## 2023-03-12 NOTE — Telephone Encounter (Signed)
Referral information sent to patient via MyChart

## 2023-03-12 NOTE — Telephone Encounter (Signed)
 Referral, records, patient demographics and insurance information have been faxed to:  [facility] [address], [address]  [place], [place] [address] Phone: [phone] Fax: [phone]

## 2023-03-12 NOTE — Telephone Encounter (Signed)
-----   Message from Ottie Glazier sent at 03/11/2023  5:17 PM EDT ----- Regarding: Colorectal surgery referral Please place a colorectal surgery referral for Curtis [name] to be evaluated by Dr. Richarda Overlie for consideration of treatment of hemorrhoids.  He has a history of ulcerative proctosigmoiditis -currently in remission.  Because of his history of underlying IBD I thought it would be best for him to be evaluated by a colorectal surgeon rather than having banding done in our office.  Let me know if you have any questions about the referral.  Curtis [name] was in agreement with being seen by colorectal surgery.  Thanks,  Harriett Sine

## 2023-03-15 ENCOUNTER — Telehealth: Payer: Self-pay | Admitting: Pediatrics

## 2023-03-15 NOTE — Telephone Encounter (Signed)
 Patient called to request a refill on Mesalamine stated the pharmacy advised him they need a new script.

## 2023-03-16 ENCOUNTER — Other Ambulatory Visit: Payer: Self-pay

## 2023-03-16 MED ORDER — MESALAMINE 1.2 G PO TBEC: DELAYED_RELEASE_TABLET | ORAL | 3 refills | Status: DC

## 2023-03-27 NOTE — Telephone Encounter (Signed)
 Referral faxed to [facility]. MyChart message sent to patient with update.

## 2023-03-27 NOTE — Telephone Encounter (Signed)
 Inbound call from patient, states that surgeon's office in [place] is closing tomorrow and is now moving to [place], patient would like to know if there are any local offices he can be referred to.

## 2023-03-27 NOTE — Telephone Encounter (Signed)
 McGreal, Durene Romans, MD  Bjorn Pippin, MD; P Lbgi Pod B Triage Cleone Slim Jonny Ruiz -  Thank you for your message regarding Mr. Memoli. I am new to the [facility] system and Dr. Roxan Hockey was the name initially provided to me by my GI colleagues.  I can reroute the referral to Dr. Maisie Fus or Dr. Cliffton Asters with [facility] in [place] given the need for multispecialty coordination of care within [facility] which make sense.  Triage nurses -per message below can you please cancel the colorectal surgery referral to Dr. Richarda Overlie and place a new one to Dr. Maisie Fus or Dr. Cliffton Asters in the [facility] system for treatment of hemorrhoids?  Meade Maw   ----- Message ----- From: Bjorn Pippin, MD Sent: 03/26/2023   4:20 PM EDT To: Ottie Glazier, MD Subject: Colorectal surgery referral question          Okley tells me he has been referred to a [facility] colorectal surgery but hasn't seen them yet.  He has a left hydrocele that is going to need surgical repair and he was hoping to have his hemorrhoids and the hydrocele repaired at the same time, but I wouldn't be able to coordinate with a Technical sales engineer since we don't work in the same systems.   Would it be possible to get him seen by Dr. Maisie Fus or Dr. Cliffton Asters with [facility] in [place]?   Just let me know.

## 2023-03-28 DIAGNOSIS — K6289 Other specified diseases of anus and rectum: Secondary | ICD-10-CM | POA: Insufficient documentation

## 2023-04-10 ENCOUNTER — Encounter: Payer: 59 | Admitting: Pediatrics

## 2023-05-10 NOTE — Progress Notes (Deleted)
 [facility] Return Visit   Referring Provider Roslyn Coombe, MD [address] [place],  [place] [address]  Primary Care Provider Roslyn Coombe, MD  Patient Profile: Curtis [name] is a 48 y.o. male who returns to the [facility] for follow-up of the problem(s) noted below.  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 twice daily  Interval History  Curtis [name] returns to the office today for follow-up of multiple gastrointestinal issues as outlined below:  Ulcerative proctosigmoiditis -- Continues on Lialda  2.4 g p.o. twice daily  -- At last visit endorsed pelvic and rectal discomfort as well as urgency -- Colonoscopy 03/2023 -skin tags, hemorrhoids, Mayo 0, normal TI, diverticula in [place], DC, TC, IH   --  -- No blood or mucus in stool -- Notes that sometimes his stools are smaller in caliber and skinny -- At times needs to massage his abdomen in order to help facilitate the passage of stool -- Passes formed bowel movements but sometimes experiences a sense of incomplete evacuation -- No nocturnal bowel movements -- Has not had a follow-up colonoscopy since initial diagnosis -- At today's visit he discusses complementary therapies and wonders if he should do a form of colon cleanse -- Denies extraintestinal manifestations of IBD  Hemorrhoids -- Previously documented to have grade 3 hemorrhoids -- 05/10/2023 - s/p excision pf hypertrophied anal papilla 2 x 5 cm; had moderately enlarged IH and EH  -- Denies bleeding but does have some anorectal discomfort with defecation -- He is interested in hemorrhoidal banding or surgery -- Reviewed that it  would be important to better understand the status of his ulcerative proctosigmoiditis before referring for hemorrhoid procedure  Chronic perineal/pelvic pain -- Has chronic pelvic pain -testicular pain, scrotal pain, voiding difficulties, hydrocele and pelvic floor dysfunction -- Had a CT scan performed this week that showed constipation, diverticulosis without diverticulitis and suspected hydrocele -- He is unsure if the pain he is experiencing is due to hydrocele or if there could be a contribution of hemorrhoids or ulcerative proctosigmoiditis  Last colonoscopy: 03/2023 - skin tags, hemorrhoids, Mayo 0, normal TI, diverticula in [place], DC, TC, IH Last endoscopy: None  Last Abd CT/CTE/MRE: 02/2023 -constipation, diverticulosis without diverticulitis, suspected hydrocele  GI Review of Symptoms Significant for chronic pelvic discomfort, fecal urgency, change in stool caliber. Otherwise negative.  General Review of Systems  Review of systems is significant for the pertinent positives and negatives as listed per the HPI.  Full ROS is otherwise negative.  Inflammatory Bowel Disease History  2018 -hematochezia, change in bowel habits; Colonoscopy-ulcerative proctosigmoiditis --> tx'd w/Lialda  2.4 g BID  IBD Medication History Lialda   Past Medical History   Past Medical History:  Diagnosis Date   ACNE, CYSTIC    resolved   Arthritis    spine, early onset   IBS (irritable bowel syndrome)    Inguinal hernia    INGUINAL HERNIA, RIGHT 04/24/2008   s/p RIH repair   Lactose intolerance    diet controlled   PONV (postoperative nausea and vomiting)    Prostatitis      Past Surgical History   Past Surgical History:  Procedure Laterality Date   COLONOSCOPY  2018  HERNIA REPAIR Right 2010   Dr. Hildy Lowers   INGUINAL HERNIA REPAIR Bilateral 03/14/2018   Procedure: LAPAROSCOPIC BILATERAL  INGUINAL HERNIA REPAIR  WITH MESH ERAS PAHTWAY;  Surgeon: Candyce Champagne, MD;  Location: [facility];  Service: General;  Laterality: Bilateral;   Surgery for spont pneumothorax Right 1996   UMBILICAL HERNIA REPAIR N/A 03/14/2018   Procedure: HERNIA REPAIR UMBILICAL ADULT;  Surgeon: Candyce Champagne, MD;  Location: [facility] [place];  Service: General;  Laterality: N/A;   WISDOM TOOTH EXTRACTION       Allergies and Medications   Allergies  Allergen Reactions   Levofloxacin  Other (See Comments)    GI problems    No outpatient medications have been marked as taking for the 05/11/23 encounter (Appointment) with Truddie Furrow, MD.     Family History  Problem Relation Age of Onset   Urolithiasis Father        Had kidney removed at age 58   Stroke Maternal Grandmother 52   Colon cancer Neg Hx    Esophageal cancer Neg Hx    Pancreatic cancer Neg Hx    Prostate cancer Neg Hx    Rectal cancer Neg Hx    Stomach cancer Neg Hx     Social History   Social History   Tobacco Use   Smoking status: Never    Passive exposure: Never   Smokeless tobacco: Never  Vaping Use   Vaping status: Former  Substance Use Topics   Alcohol use: Yes    Alcohol/week: 3.0 standard drinks of alcohol    Types: 3 Cans of beer per week    Comment: rarely   Drug use: Yes    Types: Marijuana    Comment: daily use   Curtis [name] reports that he has never smoked. He has never been exposed to tobacco smoke. He has never used smokeless tobacco. He reports current alcohol use of about 3.0 standard drinks of alcohol per week. He reports current drug use. Drug: Marijuana.  Vital Signs and Physical Examination   There were no vitals filed for this visit.  There is no height or weight on file to calculate BMI.    General: Well developed, well nourished, no acute distress Head: Normocephalic and atraumatic Mouth: No deformities or lesions noted Lungs: Clear throughout to auscultation Heart: Regular rate and rhythm; No murmurs, rubs or bruits Abdomen: Soft, non tender and non distended.  No masses, hepatosplenomegaly or hernias noted. Normal Bowel sounds Rectal: Deferred Musculoskeletal: Symmetrical with no gross deformities     Review of Data  The following data was reviewed at the time of this encounter:  Laboratory Studies      Latest Ref Rng & Units 01/25/2023    4:53 PM 06/22/2022    4:27 PM 01/26/2021    2:06 PM  CBC  WBC 4.0 - 10.5 K/uL 9.1  9.1  10.2   Hemoglobin 13.0 - 17.0 g/dL 16.1  09.6  04.5   Hematocrit 39.0 - 52.0 % 45.9  45.1  46.2   Platelets 150.0 - 400.0 K/uL 280.0  285.0  298.0     No results found for: "LIPASE"    Latest Ref Rng & Units 01/25/2023    4:53 PM 06/22/2022    4:27 PM 01/26/2021    2:06 PM  CMP  Glucose 70 - 99 mg/dL 95  94  94   BUN 6 - 23 mg/dL 14  15  20    Creatinine 0.40 - 1.50 mg/dL  0.94  1.03  1.08   Sodium 135 - 145 mEq/L 141  139  138   Potassium 3.5 - 5.1 mEq/L 4.3  3.8  4.0   Chloride 96 - 112 mEq/L 103  101  98   CO2 19 - 32 mEq/L 27  24  29    Calcium 8.4 - 10.5 mg/dL 9.4  9.4  9.8   Total Protein 6.0 - 8.3 g/dL 7.1  7.3  7.8   Total Bilirubin 0.2 - 1.2 mg/dL 0.3  0.4  0.4   Alkaline Phos 39 - 117 U/L 80  81  87   AST 0 - 37 U/L 19  17  18    ALT 0 - 53 U/L 21  27  25     IBD Labs  Prebiologic Labs   Therapeutic Drug Monitoring  Thiopurine metabolite levels:  Date:                6-TGN       6-MMP  Biologic level and antibodies:  Fecal Calprotectin    Imaging Studies  CTAP 02/2023 1. Colonic stool burden compatible with constipation. 2. Scattered colonic diverticulosis without findings of acute diverticulitis. 3. Partially visualized 5.8 cm fluid collection in the left hemiscrotum, possibly reflecting a hydrocele. Suggest further evaluation with scrotal ultrasound.    GI Procedures and Studies  Colonoscopy 03/2023 Skin tags, hemorrhoids, Mayo 0, normal TI, diverticula in [place], DC, TC, IH Path: normal tissue in right, left and RS colon  Colonoscopy 05/2016 Diffuse moderate inflammation in the  rectosigmoid colon, o/w normal Path: Chronic active colitis in rectosigmoid colon, remainder of colon biopsies normal  Colonoscopy 04/2013 Normal colon and terminal ileum   Clinical Impression  It is my clinical impression that Curtis [name] is a 48 y.o. 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  With respect to his ulcerative proctosigmoiditis he notes fecal urgency, change in bowel caliber and incomplete defecation.  He is not noting blood in his stool.  He has not had updated endoscopic evaluation since initial diagnosis.  He is also experiencing anorectal discomfort queries if this is related to hemorrhoids.  He is interested in having either hemorrhoidal banding or surgery performed.  At today's visit we discussed that it would be important to understand whether or not there is active inflammation in his colon related to ulcerative proctosigmoiditis before proceeding with hemorrhoidal treatments.  I have recommended performing a colonoscopy for disease activity reassessment of his ulcerative colitis as well as to better assess the status of his hemorrhoids.  He is aware of the potential risks of anesthesia, aspiration, perforation and bleeding and agreeable to proceed.   He also has a history of painful pelvic floor syndrome with testicular pain, scrotal pain, voiding difficulties, hydrocele and pelvic floor dysfunction.  He is followed by Dr. Homero Luster.  CT scan performed earlier this week showed colonic stool burden compatible with constipation, diverticulosis without diverticulitis and a hydrocele.  No other abnormalities.  Plan  Schedule restaging colonoscopy at [facility] Continue Lialda  2.4 g daily Results of  colonoscopy additional guidance can be provided regarding hemorrhoidal procedures Continue limiting lactose in diet After colonoscopy can discuss possibility of instituting a bowel regimen given symptoms of difficulty with defecation and finding of constipation on recent CT scan Continue care with  Dr. Inga Manges regarding painful pelvic floor syndrome  IBD Health Maintenance  Vaccinations Influenza: PCV13: PPSV23: COVID19: HAV/HBV: Shingles: HPV:  DEXA Date:                            Result:  Pap Smear   Eye Exam   Skin Exam   Surveillance Colonoscopy   Tobacco Use   Depression Screen     Planned Follow Up  1 year  The patient or caregiver verbalized understanding of the material covered, with no barriers to understanding. All questions were answered. Patient or caregiver is agreeable with the plan outlined above.    It was a pleasure to see Curtis [name].  If you have any questions or concerns regarding this evaluation, do not hesitate to contact me.  Eugenia Hess, MD [facility]

## 2023-05-11 ENCOUNTER — Ambulatory Visit: Admitting: Pediatrics

## 2023-05-15 ENCOUNTER — Telehealth (HOSPITAL_BASED_OUTPATIENT_CLINIC_OR_DEPARTMENT_OTHER): Payer: Self-pay

## 2023-05-15 DIAGNOSIS — K642 Third degree hemorrhoids: Secondary | ICD-10-CM | POA: Insufficient documentation

## 2023-05-15 NOTE — Telephone Encounter (Signed)
 Left pt message to advise he should contact his surgeons office to advise on this as we have no seen him since last spring  Copied from CRM [id number]. Topic: General - Other >> May 15, 2023 12:25 PM Eveleen Hinds B wrote: Reason for CRM: Recent surgery, and vomited for a few days. Patient complains of severe throat pain... raw.  Needs a call from Hiunsucker's office as to what he should do now.

## 2023-05-18 ENCOUNTER — Other Ambulatory Visit: Payer: Self-pay | Admitting: Urology

## 2023-05-31 ENCOUNTER — Emergency Department (HOSPITAL_COMMUNITY)
Admission: EM | Admit: 2023-05-31 | Discharge: 2023-05-31 | Disposition: A | Discharge status: Home / Self Care | Attending: Emergency Medicine | Admitting: Emergency Medicine

## 2023-05-31 ENCOUNTER — Emergency Department (HOSPITAL_COMMUNITY)

## 2023-05-31 ENCOUNTER — Encounter (HOSPITAL_COMMUNITY): Payer: Self-pay

## 2023-05-31 ENCOUNTER — Other Ambulatory Visit: Payer: Self-pay

## 2023-05-31 DIAGNOSIS — N433 Hydrocele, unspecified: Secondary | ICD-10-CM | POA: Diagnosis not present

## 2023-05-31 DIAGNOSIS — D72829 Elevated white blood cell count, unspecified: Secondary | ICD-10-CM | POA: Diagnosis not present

## 2023-05-31 DIAGNOSIS — N50812 Left testicular pain: Secondary | ICD-10-CM | POA: Insufficient documentation

## 2023-05-31 LAB — CBC WITH DIFFERENTIAL/PLATELET
Abs Immature Granulocytes: 0.08 10*3/uL — ABNORMAL HIGH (ref 0.00–0.07)
Basophils Absolute: 0 10*3/uL (ref 0.0–0.1)
Basophils Relative: 0 %
Eosinophils Absolute: 0.1 10*3/uL (ref 0.0–0.5)
Eosinophils Relative: 1 %
HCT: 46.5 % (ref 39.0–52.0)
Hemoglobin: 15.5 g/dL (ref 13.0–17.0)
Immature Granulocytes: 1 %
Lymphocytes Relative: 18 %
Lymphs Abs: 2.3 10*3/uL (ref 0.7–4.0)
MCH: 30.3 pg (ref 26.0–34.0)
MCHC: 33.3 g/dL (ref 30.0–36.0)
MCV: 90.8 fL (ref 80.0–100.0)
Monocytes Absolute: 1.2 10*3/uL — ABNORMAL HIGH (ref 0.1–1.0)
Monocytes Relative: 9 %
Neutro Abs: 9.3 10*3/uL — ABNORMAL HIGH (ref 1.7–7.7)
Neutrophils Relative %: 71 %
Platelets: 267 10*3/uL (ref 150–400)
RBC: 5.12 MIL/uL (ref 4.22–5.81)
RDW: 13.7 % (ref 11.5–15.5)
WBC: 13 10*3/uL — ABNORMAL HIGH (ref 4.0–10.5)
nRBC: 0 % (ref 0.0–0.2)

## 2023-05-31 LAB — BASIC METABOLIC PANEL WITH GFR
Anion gap: 7 (ref 5–15)
BUN: 13 mg/dL (ref 6–20)
CO2: 28 mmol/L (ref 22–32)
Calcium: 9.1 mg/dL (ref 8.9–10.3)
Chloride: 101 mmol/L (ref 98–111)
Creatinine, Ser: 0.92 mg/dL (ref 0.61–1.24)
GFR, Estimated: >60 mL/min (ref >60)
Glucose, Bld: 122 mg/dL — ABNORMAL HIGH (ref 70–99)
Potassium: 3.6 mmol/L (ref 3.5–5.1)
Sodium: 136 mmol/L (ref 135–145)

## 2023-05-31 MED ORDER — OXYCODONE-ACETAMINOPHEN 5-325 MG PO TABS
1.0000 | ORAL_TABLET | Freq: Once | ORAL | Status: AC
  Administered 2023-05-31: 1 via ORAL
  Filled 2023-05-31: qty 1

## 2023-05-31 MED ORDER — OXYCODONE-ACETAMINOPHEN 5-325 MG PO TABS: 1.0000 | ORAL_TABLET | Freq: Four times a day (QID) | ORAL | 0 refills | Status: AC | PRN

## 2023-05-31 NOTE — Discharge Instructions (Addendum)
 You were given medication to help with your pain.  Please take 1 tablet every 6 hours as needed for severe pain.  Please call [facility] if you feel your pain is not tolerable anymore.

## 2023-05-31 NOTE — ED Triage Notes (Signed)
 Pt reports known hydrocele and supposed to have surgery in one month but reports increased pain and swelling over the last couple days. Has been able to urinate without issue

## 2023-05-31 NOTE — ED Provider Notes (Signed)
  Physical Exam  BP 135/85   Pulse 73   Temp 97.9 F (36.6 C) (Oral)   Resp 18   Ht 6\' 1"  (1.854 m)   Wt 77.1 kg   SpO2 100%   BMI 22.43 kg/m   Physical Exam Vitals and nursing note reviewed.  Constitutional:      Appearance: Normal appearance.  HENT:     Head: Normocephalic and atraumatic.     Mouth/Throat:     Mouth: Mucous membranes are moist.  Cardiovascular:     Rate and Rhythm: Normal rate.  Pulmonary:     Effort: Pulmonary effort is normal.  Abdominal:     General: Abdomen is flat.  Musculoskeletal:     Cervical back: Normal range of motion and neck supple.  Skin:    General: Skin is warm and dry.  Neurological:     Mental Status: He is alert and oriented to person, place, and time.     Procedures  Procedures  ED Course / MDM    Medical Decision Making Amount and/or Complexity of Data Reviewed Labs: ordered. Radiology: ordered.  Risk Prescription drug management.   Patient care assumed from prior provider Lauren B PA at shift change, please see her note for full HPI.  Briefly, patient with underlying history of hydrocele here for worsening pain to his left testicle, has not taken any medication at home to help with symptoms.  Does have a hydrocelectomy scheduled with Dr. Wrenn in about a month.  Given Percocet here for pain control.  Pending ultrasound as ultrasounds not scheduled till 6 AM.  IMPRESSION:  1. No evidence of testicular mass or torsion.  2. Bilateral hydroceles, large on the left, moderate on the right.  3. 5.6 cm septated cyst in the right epididymal head.  4. Bilateral appendix testis, left slightly larger.   Call placed to Urology for further recommendations.  8:06 AM Spoke to [employer] APP who review ultrasound, reports likely stable at this time will follow-up outpatient.  I discussed this with patient, he will also go home on a short course of Percocet to help with severe pain.  Hemodynamically stable for discharge.   Portions of  this note were generated with Scientist, clinical (histocompatibility and immunogenetics). Dictation errors may occur despite best attempts at proofreading.          Erven Ramson, PA-C 05/31/23 2952    Countryman, Chase, MD 05/31/23 2326

## 2023-05-31 NOTE — ED Provider Notes (Signed)
 [place] EMERGENCY DEPARTMENT AT [facility] Provider Note   CSN: [id number] Arrival date & time: 05/31/23  9528     History  Chief Complaint  Patient presents with   Testicle Pain    Curtis [name] is a 48 y.o. male with a past medical history of IBS, GERD, HLD presents emergency department for evaluation of worsening swelling and pain of left testicle over the past 2-3 weeks.  He has been monitored by urology Dr. Inga Manges over the past year for his hydrocele.  They have scheduled a hydrocele removal 06/29/23.  Today, he reports some mild burning with urination and difficulty with starting stream.  He has no STD concerns.  He denies penile discharge, fevers, chills, std concerns   Testicle Pain      Home Medications Prior to Admission medications   Medication Sig Start Date End Date Taking? Authorizing Provider  fluticasone  furoate-vilanterol (BREO ELLIPTA ) 200-25 MCG/ACT AEPB Inhale 1 puff into the lungs daily. 07/04/22   Hunsucker, Archer Kobs, MD  mesalamine  (LIALDA ) 1.2 g EC tablet TAKE 2 TABLETS BY MOUTH TWICE A DAY 03/16/23   Truddie Furrow, MD  Probiotic Product (PROBIOTIC PO) Take 1 capsule by mouth daily.    [provider]      Allergies    Levofloxacin     Review of Systems   Review of Systems  Genitourinary:  Positive for testicular pain.    Physical Exam Updated Vital Signs BP 137/89 (BP Location: Left Arm)   Pulse 77   Temp 97.9 F (36.6 C) (Oral)   Resp 19   Ht 6\' 1"  (1.854 m)   Wt 77.1 kg   SpO2 97%   BMI 22.43 kg/m  Physical Exam Vitals and nursing note reviewed.  Constitutional:      General: He is not in acute distress.    Appearance: Normal appearance.  HENT:     Head: Normocephalic and atraumatic.  Eyes:     Conjunctiva/sclera: Conjunctivae normal.  Cardiovascular:     Rate and Rhythm: Normal rate.  Pulmonary:     Effort: Pulmonary effort is normal. No respiratory distress.  Genitourinary:    Penis: Normal.       Comments: Large approx 5cm swollen tender left testicle. TTP along left cord. No abnormalities nor tenderness to right testicle. No penile discharge. No inguinal lymphadenopathy Skin:    Coloration: Skin is not jaundiced or pale.  Neurological:     Mental Status: He is alert. Mental status is at baseline.   GU exam chaperoned by Kevan Peers  ED Results / Procedures / Treatments   Labs (all labs ordered are listed, but only abnormal results are displayed) Labs Reviewed  CBC WITH DIFFERENTIAL/PLATELET - Abnormal; Notable for the following components:      Result Value   WBC 13.0 (*)    Neutro Abs 9.3 (*)    Monocytes Absolute 1.2 (*)    Abs Immature Granulocytes 0.08 (*)    All other components within normal limits  BASIC METABOLIC PANEL WITH GFR    EKG None  Radiology No results found.  Procedures Procedures    Medications Ordered in ED Medications  oxyCODONE -acetaminophen  (PERCOCET/ROXICET) 5-325 MG per tablet 1 tablet (1 tablet Oral Given 05/31/23 4132)    ED Course/ Medical Decision Making/ A&P  Medical Decision Making Amount and/or Complexity of Data Reviewed Labs: ordered. Radiology: ordered.  Risk Prescription drug management.   Patient presents to the ED for concern of left testicular swelling and pain, this involves an extensive number of treatment options, and is a complaint that carries with it a high risk of complications and morbidity.  The differential diagnosis includes worsening hydrocele, epididymitis, testicular torsion   Co morbidities that complicate the patient evaluation  See HPI   Additional history obtained:  Additional history obtained from Nursing   External records from outside source obtained and reviewed including triage note   Lab Tests:  I Ordered, and personally interpreted labs.  The pertinent results include:   Leukocytosis of 13   Imaging Studies ordered:  I ordered imaging  studies including scrotal US   Pending at sign out to oncoming provider     Medicines ordered and prescription drug management:  I ordered medication including percocet  for pain  Reevaluation of the patient after these medicines showed that the patient improved I have reviewed the patients home medicines and have made adjustments as needed     Problem List / ED Course:  Hydrocele Left testicular pain and swelling Been worsening over past 2-3 weeks and has hydrocele removal surgery scheduled Percocet provided for pain Will obtain scrotum US  to r/o torsion.  However when patient arrived and was evaluated at 0500 this morning, did not have ultrasound until 6 AM, so was unable to be completed until their arrival   Reevaluation:  After the interventions noted above, I reevaluated the patient and found that they have :stayed the same    Disposition:  Dispo pending completion of ED workup, scrotal US . As long as there is no significant acute abnormality nor torsion noted on US , patient will likely be d/c to follow up with Urology for further management. ED workup discussed and sign out to [facility] PA   Final Clinical Impression(s) / ED Diagnoses Final diagnoses:  Left hydrocele  Left testicular pain    Rx / DC Orders ED Discharge Orders     None         Royann Cords, PA 05/31/23 1610    Onetha Bile, MD 05/31/23 [phone]

## 2023-06-01 ENCOUNTER — Telehealth: Payer: Self-pay

## 2023-06-01 NOTE — Transitions of Care (Post Inpatient/ED Visit) (Signed)
   06/01/2023  Name: Curtis [name] MRN: [id number] DOB: 15-May-1975  Today's TOC FU Call Status: Today's TOC FU Call Status:: Unsuccessful Call (1st Attempt) Unsuccessful Call (1st Attempt) Date: 06/01/23  Attempted to reach the patient regarding the most recent Inpatient/ED visit.  Follow Up Plan: Additional outreach attempts will be made to reach the patient to complete the Transitions of Care (Post Inpatient/ED visit) call.   Signature Nike Southers,CMA

## 2023-06-01 NOTE — Telephone Encounter (Signed)
 Copied from CRM [id number]. Topic: General - Other >> Jun 01, 2023  2:46 PM Marissa P wrote: Reason for CRM: Patient called in regards to Arvell Birchwood, CMA call returning her call. Please call patient back at your earliest convenience, will be expecting your call

## 2023-06-04 ENCOUNTER — Ambulatory Visit: Admitting: Internal Medicine

## 2023-06-18 NOTE — Patient Instructions (Signed)
 SURGICAL WAITING ROOM VISITATION  Patients having surgery or a procedure may have no more than 2 support people in the waiting area - these visitors may rotate.    Children under the age of 73 must have an adult with them who is not the patient.  Visitors with respiratory illnesses are discouraged from visiting and should remain at home.  If the patient needs to stay at the hospital during part of their recovery, the visitor guidelines for inpatient rooms apply. Pre-op nurse will coordinate an appropriate time for 1 support person to accompany patient in pre-op.  This support person may not rotate.    Please refer to the [facility] website for the visitor guidelines for Inpatients (after your surgery is over and you are in a regular room).    Your procedure is scheduled on: 06/29/23   Report to [facility] Main Entrance    Report to admitting at 5:15 AM   Call this number if you have problems the morning of surgery [phone]   Do not eat food or drink liquids :After Midnight.          If you have questions, please contact your surgeon's office.   FOLLOW BOWEL PREP AND ANY ADDITIONAL PRE OP INSTRUCTIONS YOU RECEIVED FROM YOUR SURGEON'S OFFICE!!!     Oral Hygiene is also important to reduce your risk of infection.                                    Remember - BRUSH YOUR TEETH THE MORNING OF SURGERY WITH YOUR REGULAR TOOTHPASTE  DENTURES WILL BE REMOVED PRIOR TO SURGERY PLEASE DO NOT APPLY Poly grip OR ADHESIVES!!!   Stop all vitamins and herbal supplements 7 days before surgery.   Take these medicines the morning of surgery with A SIP OF WATER: Inhalers              You may not have any metal on your body including jewelry, and body piercing             Do not wear lotions, powders, cologne, or deodorant              Men may shave face and neck.   Do not bring valuables to the hospital. [facility] IS NOT             RESPONSIBLE   FOR VALUABLES.   Contacts,  glasses, dentures or bridgework may not be worn into surgery.  DO NOT BRING YOUR HOME MEDICATIONS TO THE HOSPITAL. PHARMACY WILL DISPENSE MEDICATIONS LISTED ON YOUR MEDICATION LIST TO YOU DURING YOUR ADMISSION IN THE HOSPITAL!    Patients discharged on the day of surgery will not be allowed to drive home.  Someone NEEDS to stay with you for the first 24 hours after anesthesia.              Please read over the following fact sheets you were given: IF YOU HAVE QUESTIONS ABOUT YOUR PRE-OP INSTRUCTIONS PLEASE CALL [name]    If you received a COVID test during your pre-op visit  it is requested that you wear a mask when out in public, stay away from anyone that may not be feeling well and notify your surgeon if you develop symptoms. If you test positive for Covid or have been in contact with anyone that has tested positive in the last 10 days please notify you surgeon.  Deal - Preparing for Surgery Before surgery, you can play an important role.  Because skin is not sterile, your skin needs to be as free of germs as possible.  You can reduce the number of germs on your skin by washing with CHG (chlorahexidine gluconate) soap before surgery.  CHG is an antiseptic cleaner which kills germs and bonds with the skin to continue killing germs even after washing. Please DO NOT use if you have an allergy to CHG or antibacterial soaps.  If your skin becomes reddened/irritated stop using the CHG and inform your nurse when you arrive at Short Stay. Do not shave (including legs and underarms) for at least 48 hours prior to the first CHG shower.  You may shave your face/neck.  Please follow these instructions carefully:  1.  Shower with CHG Soap the night before surgery and the  morning of surgery.  2.  If you choose to wash your hair, wash your hair first as usual with your normal  shampoo.  3.  After you shampoo, rinse your hair and body thoroughly to remove the shampoo.                              4.  Use CHG as you would any other liquid soap.  You can apply chg directly to the skin and wash.  Gently with a scrungie or clean washcloth.  5.  Apply the CHG Soap to your body ONLY FROM THE NECK DOWN.   Do   not use on face/ open                           Wound or open sores. Avoid contact with eyes, ears mouth and   genitals (private parts).                       Wash face,  Genitals (private parts) with your normal soap.             6.  Wash thoroughly, paying special attention to the area where your    surgery  will be performed.  7.  Thoroughly rinse your body with warm water from the neck down.  8.  DO NOT shower/wash with your normal soap after using and rinsing off the CHG Soap.                9.  Pat yourself dry with a clean towel.            10.  Wear clean pajamas.            11.  Place clean sheets on your bed the night of your first shower and do not  sleep with pets. Day of Surgery : Do not apply any lotions/deodorants the morning of surgery.  Please wear clean clothes to the hospital/surgery center.  FAILURE TO FOLLOW THESE INSTRUCTIONS MAY RESULT IN THE CANCELLATION OF YOUR SURGERY  PATIENT SIGNATURE_________________________________  NURSE SIGNATURE__________________________________  ________________________________________________________________________

## 2023-06-18 NOTE — Progress Notes (Addendum)
 COVID Vaccine Completed:  Date of COVID positive in last 90 days:  PCP - Rosalia Colonel, MD Cardiologist - n/a  Chest x-ray - n/a EKG - n/a Stress Test - n/a ECHO - n/a Cardiac Cath - n/a Pacemaker/ICD device last checked: n/a Spinal Cord Stimulator:n/a  Bowel Prep - no  Sleep Study - n/a CPAP -   Fasting Blood Sugar - n/a Checks Blood Sugar _____ times a day  Last dose of GLP1 agonist-  N/A GLP1 instructions:  Hold 7 days before surgery    Last dose of SGLT-2 inhibitors-  N/A SGLT-2 instructions:  Hold 3 days before surgery    Blood Thinner Instructions:  Last dose: n/a Time: Aspirin Instructions: Last Dose:  Activity level: Can go up a flight of stairs and perform activities of daily living without stopping and without symptoms of chest pain or shortness of breath.  Anesthesia review:   Patient denies shortness of breath, fever, cough and chest pain at PAT appointment  Patient verbalized understanding of instructions that were given to them at the PAT appointment. Patient was also instructed that they will need to review over the PAT instructions again at home before surgery.

## 2023-06-19 ENCOUNTER — Encounter (HOSPITAL_COMMUNITY): Payer: Self-pay

## 2023-06-19 ENCOUNTER — Encounter (HOSPITAL_COMMUNITY)
Admission: RE | Admit: 2023-06-19 | Discharge: 2023-06-19 | Disposition: A | Discharge status: Home / Self Care | Source: Ambulatory Visit | Attending: Urology | Admitting: Urology

## 2023-06-19 ENCOUNTER — Other Ambulatory Visit: Payer: Self-pay

## 2023-06-19 ENCOUNTER — Other Ambulatory Visit: Payer: Self-pay | Admitting: Pediatrics

## 2023-06-19 VITALS — BP 120/86 | HR 71 | Temp 97.8°F | Resp 16 | Ht 73.0 in | Wt 170.0 lb

## 2023-06-19 DIAGNOSIS — Z01818 Encounter for other preprocedural examination: Secondary | ICD-10-CM

## 2023-06-19 DIAGNOSIS — Z01812 Encounter for preprocedural laboratory examination: Secondary | ICD-10-CM | POA: Insufficient documentation

## 2023-06-19 HISTORY — DX: Unspecified asthma, uncomplicated: J45.909

## 2023-06-19 LAB — CBC
HCT: 45 % (ref 39.0–52.0)
Hemoglobin: 14.8 g/dL (ref 13.0–17.0)
MCH: 29.9 pg (ref 26.0–34.0)
MCHC: 32.9 g/dL (ref 30.0–36.0)
MCV: 90.9 fL (ref 80.0–100.0)
Platelets: 280 10*3/uL (ref 150–400)
RBC: 4.95 MIL/uL (ref 4.22–5.81)
RDW: 13.4 % (ref 11.5–15.5)
WBC: 10 10*3/uL (ref 4.0–10.5)
nRBC: 0 % (ref 0.0–0.2)

## 2023-06-28 NOTE — H&P (Signed)
 05/24/22: Curtis [name] was seen by his PCP 2 weeks ago for left testicular pain and swelling and was found to have orchitis that was treated with antibiotics. He has had bilateral inguinal hernia repairs, right open, left laparoscopic. He has improved some with the antibiotics but he has some intermittent swelling. He had success with PT in the past. He had backed off on his exercise and PT exercises in February. He had negative STD testing and a negative UA.   09/22/2022: He comes in today with continued lower abdominal, pelvic and testicular/scrotal pain/discomfort. Admittedly he has not been doing previously instructed pelvic floor PT which has been helpful. Symptoms are usually worse in the morning. He states he wakes up feeling crampy, pain radiates into both testicles. He states he can feel in it his cords. It takes most of the day for that to resolve. Also correlated with voiding dysfunction including urgency, weak intermittent stream. He is also describing intermittent bowel dysfunction as well. He is not having any dysuria or gross hematuria. He is quite anxious about his symptoms. He is worried about his known hydrocele. He had an elevated PSA earlier this year but that got better following last round of antibiotics which was prior to his scrotal ultrasound. 2.8-2.9 at last check.   03/26/23: Curtis [name] returns today in f/u. He has persistent swelling of the left scrotum. He had a CT AP in 2/25 that showed the hydrocele but no hernia was seen. He has some pain and discomfort with the hydrocele. His PSA was up to 4.03 in 6/24 and he was treated by his PCP with antibiotics. The PSA was back down to 1.98 in 1/25.   06/04/2023: Patient with above-noted history, he is scheduled for left hydrocelectomy with Dr. Watt on 6/27. In early May he underwent surgical incision of a prolapsing hypertrophied anal hemorrhoid with Dr. Lang at [place]. He then sought second opinion doing a couple days later with Dr. Sheldon of  general surgery here locally. There were no concerns for hernia recurrence at time of that exam.  He went to the emergency department on 5/29 with complaints of worsening left hemiscrotal swelling with correlating increased pain/discomfort to the affected area. Scrotal ultrasound showed a large left hydrocele as well as a smaller 1 on the right with a septated but small epididymal head cyst. He also had bilateral appendix testes more prominent on the left than the right. There are no acute concerns. Abscess, epididymoorchitis or testicular torsion. Seen today, pain in the left testicle and scrotum continues. He has been managing with over-the-counter anti-inflammatories and taking previously prescribed pain medication at night. He denies any new or worsening LUTS including absence of any dysuria or gross hematuria. UA here without concerning microscopic abnormalities. He denies fevers or chills, nausea/vomiting.   06/21/2023: He remains scheduled for left hydrocelectomy with Dr. Watt on 6/27. Now back for additional discussion due to the fact that prior ultrasound did show a small right hydrocele as well. He saw significant improvement with the doxycycline  and meloxicam I prescribed at last exam. He is now describing increased pain and discomfort on the right with subjectively increased swelling. He is asking if he can have the right hydrocele treated as well. He denies any new or worsening LUTS including absence of any dysuria or gross hematuria. UA here without concerning microscopic abnormalities. He denies fevers or chills, nausea/vomiting. No interval fevers or chills, nausea/vomiting.     ALLERGIES: Levofloxacin  GLENWOOD Caller    MEDICATIONS: Breo Ellipta   Lialda      GU PSH: Cystoscopy - 2021       PSH Notes: Inguinal Hernia Repair   NON-GU PSH: Incisional hernia repair (laparoscopic), Right Inguinal hernia repair (laparoscopic), Left Visit Complexity (formerly GPC1X) - 03/26/2023, 05/24/2022      GU PMH: Hydrocele - 06/04/2023, the left hydrocele has enlarged and is getting in the way. It is causing discomfort and he would like to have it fixed. I discussed aspiration and surgical repair. I think with his history of pain that surgical repair would be more appropriate. I reviewed the risks of bleeding infection, recurrence, testicular injury, thrombotic events and anesthetic complications. He has some rectal issues and is seeing a Magazine features editor tomorrow. He may need rectal surgery and if that comes to pass, he will either need to find a colorectal surgeon here or a urologist with [facility] since he wants the procedures simultanously if that is feasible. , - 03/26/2023, - 09/22/2022, - 05/25/2022, He has moderate bilateral soft hydroceles, left > right. I discussed evaluation and management and will have him return for a scrotal US  and then f/u with results. , - 05/24/2022 Left testicular pain - 06/04/2023, - 03/26/2023, - 05/25/2022, he has some epididymal induration on the left but no significant tenderness today and his UA is clear. US  ordered. , - 05/24/2022 Pelvic/perineal pain (Stable) - 09/22/2022, - 2022, - 2022, - 2022, - 2022, He has some progressive discomfort that seems pelvic floor muscular. I am going to have him return for additional PT and will give him a trial of alfuzosin. He was given a list of dietary irritants and will see his GI about further evaluation. , - 2022, - 2021 Chronic prostatitis - 2022, He has had resolution of the voiding symptoms but still has some inguinal and pelvic pain possibly related to his prior hernia repairs. I think he would benefit from a physical therapy evaluation and will make the referral. he will return to see me in 3 months. , - 2021, He is getting better with the levaquin  and will continue that to completion and f/u in 4-6 weeks. I also suggested OTC NSAIDs. , - 2021 Nocturia, He is doing better following PT with decreased pain and voiding complaints.  He will return in year. - 2022, - 2021 Urinary Urgency - 2022, - 2022, - 2022 Microscopic hematuria, UA is clear today. - 2022, W/U negative. F/U 6 months. , - 2021 Bladder-neck stenosis/contracture, Bladder neck contracture - 2014 BPH w/o LUTS, Benign prostatic hypertrophy without lower urinary tract symptoms - 2014 Inflammatory Disease Prostate, Unspec, Prostatitis - 2014 LLQ pain, Abdominal pain, LLQ (left lower quadrant) - 2014 Oth hemorrhoids      PMH Notes:  1898-01-02 00:00:00 - Note: Normal Routine History And Physical Adult  2010-01-24 09:24:41 - Note: Arthritis      spontaneous pneumothorax 1995   NON-GU PMH: Muscle weakness (generalized) - 2022, - 2022, - 2022, - 2021 Other muscle spasm - 2022, - 2022, - 2022, - 2021 Other specified disorders of muscle - 2022, - 2022, - 2022, - 2021 Diverticulitis Ulcerative Colitis    FAMILY HISTORY: Congenital solitary kidney - Father Family Health Status - Father alive at age 56 - Runs In Family Family Health Status - Mother's Age - [facility] In [facility] Family Health Status Number - No Family History nephrolithiasis - Father No significant past medical history - Runs in Family   SOCIAL HISTORY: Marital Status: Single Preferred Language: English; Race: White Current Smoking Status: Patient  smokes.   Tobacco Use Assessment Completed: Used Tobacco in last 30 days? Patient uses recreational drugs. Uses marijuana. Does not drink caffeine. Patient's occupation is/was [employer].     Notes: Occupation:, smokes canibus qd for pain relief per pt, Marital History - Single, Caffeine Use, Alcohol Use   REVIEW OF SYSTEMS:    GU Review Male:   Patient denies frequent urination, hard to postpone urination, burning/ pain with urination, get up at night to urinate, leakage of urine, stream starts and stops, trouble starting your stream, have to strain to urinate , erection problems, and penile pain.  Gastrointestinal (Upper):   Patient denies  nausea, vomiting, and indigestion/ heartburn.  Gastrointestinal (Lower):   Patient denies diarrhea and constipation.  Constitutional:   Patient denies fever, night sweats, weight loss, and fatigue.  Skin:   Patient denies skin rash/ lesion and itching.  Eyes:   Patient denies blurred vision and double vision.  Ears/ Nose/ Throat:   Patient denies sore throat and sinus problems.  Hematologic/Lymphatic:   Patient denies swollen glands and easy bruising.  Cardiovascular:   Patient denies leg swelling and chest pains.  Respiratory:   Patient denies cough and shortness of breath.  Endocrine:   Patient denies excessive thirst.  Musculoskeletal:   Patient denies back pain and joint pain.  Neurological:   Patient denies headaches and dizziness.  Psychologic:   Patient denies anxiety and depression.   VITAL SIGNS:      06/21/2023 11:34 AM  BP 108/68 mmHg  Pulse 76 /min  Temperature 97.3 F / 36.2 C   GU PHYSICAL EXAMINATION:    Scrotum: No lesions. No edema. No cysts. No warts.   Testes: 5+ cm hydrocele left testis. Questionable, minimally palpable 1-2 cm hydrocele right testis. Continued swelling in the predominant left hemiscrotal region, the left testicle and epididymis cannot be accurately palpated on today's exam. There is no point tenderness on today's exam. There is no palpable fluctuance or crepitus.  Urethral Meatus: Normal size. No lesion, no wart, no discharge, no polyp. Normal location.  Penis: Circumcised, no warts, no cracks. No dorsal Peyronie's plaques, no left corporal Peyronie's plaques, no right corporal Peyronie's plaques, no scarring, no warts. No balanitis, no meatal stenosis.   MULTI-SYSTEM PHYSICAL EXAMINATION:    Constitutional: Well-nourished. No physical deformities. Normally developed. Good grooming.  Neck: Neck symmetrical, not swollen. Normal tracheal position.  Respiratory: No labored breathing, no use of accessory muscles.   Cardiovascular: Normal temperature,  normal extremity pulses, no swelling, no varicosities.  Skin: No paleness, no jaundice, no cyanosis. No lesion, no ulcer, no rash.  Neurologic / Psychiatric: Oriented to time, oriented to place, oriented to person. No depression, no anxiety, no agitation.   Gastrointestinal: No hernia. No mass, no tenderness, no rigidity, non obese abdomen.   Musculoskeletal: Normal gait and station of head and neck.     Complexity of Data:  Source Of History:  Patient, Medical Record Summary  Records Review:   Previous Doctor Records, Previous Hospital Records, Previous Patient Records  Urine Test Review:   Urinalysis  X-Ray Review: Scrotal Ultrasound: Reviewed Films. Reviewed Report. Discussed With Patient.     06/21/23  Urinalysis  Urine Appearance Clear   Urine Color Yellow   Urine Glucose Neg mg/dL  Urine Bilirubin Neg mg/dL  Urine Ketones Neg mg/dL  Urine Specific Gravity 1.010   Urine Blood Trace ery/uL  Urine pH 7.5   Urine Protein Trace mg/dL  Urine Urobilinogen 0.2 mg/dL  Urine  Nitrites Neg   Urine Leukocyte Esterase Neg leu/uL  Urine WBC/hpf NS (Not Seen)   Urine RBC/hpf 0 - 2/hpf   Urine Epithelial Cells 0 - 5/hpf   Urine Bacteria NS (Not Seen)   Urine Mucous Not Present   Urine Yeast NS (Not Seen)   Urine Trichomonas Not Present   Urine Cystals NS (Not Seen)   Urine Casts NS (Not Seen)   Urine Sperm Not Present    PROCEDURES:          Urinalysis w/Scope Dipstick Dipstick Cont'd Micro  Color: Yellow Bilirubin: Neg mg/dL WBC/hpf: NS (Not Seen)  Appearance: Clear Ketones: Neg mg/dL RBC/hpf: 0 - 2/hpf  Specific Gravity: 1.010 Blood: Trace ery/uL Bacteria: NS (Not Seen)  pH: 7.5 Protein: Trace mg/dL Cystals: NS (Not Seen)  Glucose: Neg mg/dL Urobilinogen: 0.2 mg/dL Casts: NS (Not Seen)    Nitrites: Neg Trichomonas: Not Present    Leukocyte Esterase: Neg leu/uL Mucous: Not Present      Epithelial Cells: 0 - 5/hpf      Yeast: NS (Not Seen)      Sperm: Not Present     ASSESSMENT:      ICD-10 Details  1 GU:   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 Again his predominant symptoms are stemming from the large left hydrocele which is scheduled to be treated on the 27th. If possible he would like to have both treated at the same time if felt to be appropriate. We talked through some of the potential complications including prolonged recovery with bilateral hydrocelectomy and ultimately it may be best just to observe the right and allow him to completely recover from his upcoming surgery before making that decision. Reassurance provided. I will send a message to Dr. Watt about this and if he feels it to be appropriate, his left hydrocelectomy can be converted to a bilateral approach. Otherwise he will keep his previously scheduled surgery date on the 27th. All questions answered to the best of my ability with understanding expressed by the patient in regards to the upcoming surgery and expected postoperative course.        Next Appointment:      Next Appointment: 06/29/2023 07:30 AM    Appointment Type: Surgery     Location: [facility] [phone]    Provider: Norleen Watt, M.D.    Reason for Visit: OP--WL--LEFT HYDRCELECTOMY 100% Covered    He is interested in a bilateral hydrocelectomy.

## 2023-06-28 NOTE — Anesthesia Preprocedure Evaluation (Addendum)
 Anesthesia Evaluation  Patient identified by MRN, date of birth, ID band Patient awake    Reviewed: Allergy & Precautions, NPO status , Patient's Chart, lab work & pertinent test results  History of Anesthesia Complications (+) PONV and history of anesthetic complications  Airway Mallampati: I  TM Distance: >3 FB Neck ROM: Full    Dental no notable dental hx. (+) Teeth Intact, Dental Advisory Given,    Pulmonary asthma    Pulmonary exam normal breath sounds clear to auscultation       Cardiovascular negative cardio ROS Normal cardiovascular exam Rhythm:Regular Rate:Normal     Neuro/Psych   Anxiety     negative neurological ROS  negative psych ROS   GI/Hepatic Neg liver ROS, PUD,GERD  ,,  Endo/Other  negative endocrine ROS    Renal/GU negative Renal ROS  negative genitourinary   Musculoskeletal  (+) Arthritis ,    Abdominal   Peds  Hematology negative hematology ROS (+)   Anesthesia Other Findings   Reproductive/Obstetrics                             Anesthesia Physical Anesthesia Plan  ASA: 2  Anesthesia Plan: General   Post-op Pain Management: Minimal or no pain anticipated, Tylenol  PO (pre-op)* and Celebrex  PO (pre-op)*   Induction: Intravenous  PONV Risk Score and Plan: 3 and Midazolam , Dexamethasone , Ondansetron , TIVA and Scopolamine  patch - Pre-op  Airway Management Planned: LMA  Additional Equipment: None  Intra-op Plan:   Post-operative Plan: Extubation in OR  Informed Consent: I have reviewed the patients History and Physical, chart, labs and discussed the procedure including the risks, benefits and alternatives for the proposed anesthesia with the patient or authorized representative who has indicated his/her understanding and acceptance.     Dental advisory given  Plan Discussed with: CRNA and Anesthesiologist  Anesthesia Plan Comments:          Anesthesia Quick Evaluation

## 2023-06-29 ENCOUNTER — Other Ambulatory Visit: Payer: Self-pay

## 2023-06-29 ENCOUNTER — Encounter (HOSPITAL_COMMUNITY): Payer: Self-pay | Admitting: Urology

## 2023-06-29 ENCOUNTER — Ambulatory Visit (HOSPITAL_COMMUNITY)
Admission: RE | Admit: 2023-06-29 | Discharge: 2023-06-29 | Disposition: A | Discharge status: Home / Self Care | Attending: Urology | Admitting: Urology

## 2023-06-29 ENCOUNTER — Encounter (HOSPITAL_COMMUNITY)
Admission: RE | Disposition: A | Discharge status: Home / Self Care | Payer: Self-pay | Source: Home / Self Care | Attending: Urology

## 2023-06-29 ENCOUNTER — Ambulatory Visit (HOSPITAL_BASED_OUTPATIENT_CLINIC_OR_DEPARTMENT_OTHER): Payer: Self-pay | Admitting: Anesthesiology

## 2023-06-29 ENCOUNTER — Ambulatory Visit (HOSPITAL_COMMUNITY): Payer: Self-pay | Admitting: Anesthesiology

## 2023-06-29 DIAGNOSIS — N433 Hydrocele, unspecified: Secondary | ICD-10-CM | POA: Insufficient documentation

## 2023-06-29 DIAGNOSIS — J45909 Unspecified asthma, uncomplicated: Secondary | ICD-10-CM | POA: Insufficient documentation

## 2023-06-29 DIAGNOSIS — Z7951 Long term (current) use of inhaled steroids: Secondary | ICD-10-CM | POA: Diagnosis not present

## 2023-06-29 DIAGNOSIS — F17200 Nicotine dependence, unspecified, uncomplicated: Secondary | ICD-10-CM | POA: Insufficient documentation

## 2023-06-29 DIAGNOSIS — F1290 Cannabis use, unspecified, uncomplicated: Secondary | ICD-10-CM | POA: Insufficient documentation

## 2023-06-29 HISTORY — PX: HYDROCELE EXCISION: SHX482

## 2023-06-29 SURGERY — HYDROCELECTOMY
Anesthesia: General | Site: Scrotum | Laterality: Bilateral

## 2023-06-29 MED ORDER — CHLORHEXIDINE GLUCONATE 0.12 % MT SOLN
15.0000 mL | Freq: Once | OROMUCOSAL | Status: AC
  Administered 2023-06-29: 15 mL via OROMUCOSAL

## 2023-06-29 MED ORDER — FENTANYL CITRATE (PF) 100 MCG/2ML IJ SOLN
INTRAMUSCULAR | Status: DC | PRN
  Administered 2023-06-29 (×4): 50 ug via INTRAVENOUS

## 2023-06-29 MED ORDER — DEXMEDETOMIDINE HCL IN NACL 80 MCG/20ML IV SOLN
INTRAVENOUS | Status: DC | PRN
  Administered 2023-06-29: 4 ug via INTRAVENOUS
  Administered 2023-06-29 (×2): 8 ug via INTRAVENOUS

## 2023-06-29 MED ORDER — LIDOCAINE HCL (PF) 2 % IJ SOLN
INTRAMUSCULAR | Status: AC
Start: 2023-06-29 — End: 2023-06-29
  Filled 2023-06-29: qty 5

## 2023-06-29 MED ORDER — SCOPOLAMINE 1 MG/3DAYS TD PT72
1.0000 | MEDICATED_PATCH | TRANSDERMAL | Status: DC
  Administered 2023-06-29: 1.5 mg via TRANSDERMAL

## 2023-06-29 MED ORDER — SODIUM CHLORIDE 0.9 % IV SOLN
250.0000 mL | INTRAVENOUS | Status: DC | PRN
Start: 2023-06-29 — End: 2023-06-29

## 2023-06-29 MED ORDER — LIDOCAINE HCL (PF) 2 % IJ SOLN
INTRAMUSCULAR | Status: DC | PRN
Start: 2023-06-29 — End: 2023-06-29
  Administered 2023-06-29: 100 mg via INTRADERMAL

## 2023-06-29 MED ORDER — EPHEDRINE 5 MG/ML INJ
INTRAVENOUS | Status: AC
  Filled 2023-06-29: qty 5

## 2023-06-29 MED ORDER — SCOPOLAMINE 1 MG/3DAYS TD PT72
MEDICATED_PATCH | TRANSDERMAL | Status: AC
  Filled 2023-06-29: qty 1

## 2023-06-29 MED ORDER — ONDANSETRON HCL 4 MG/2ML IJ SOLN
INTRAMUSCULAR | Status: AC
  Filled 2023-06-29: qty 2

## 2023-06-29 MED ORDER — SODIUM CHLORIDE 0.9% FLUSH: 3.0000 mL | Freq: Two times a day (BID) | INTRAVENOUS | Status: DC

## 2023-06-29 MED ORDER — MEPERIDINE HCL 50 MG/ML IJ SOLN: 6.2500 mg | INTRAMUSCULAR | Status: DC | PRN

## 2023-06-29 MED ORDER — PROPOFOL 10 MG/ML IV BOLUS
INTRAVENOUS | Status: AC
  Filled 2023-06-29: qty 20

## 2023-06-29 MED ORDER — LIDOCAINE 2% (20 MG/ML) 5 ML SYRINGE: INTRAMUSCULAR | Status: DC | PRN

## 2023-06-29 MED ORDER — OXYCODONE HCL 5 MG PO TABS: 5.0000 mg | ORAL_TABLET | ORAL | Status: DC | PRN

## 2023-06-29 MED ORDER — SODIUM CHLORIDE 0.9% FLUSH
3.0000 mL | INTRAVENOUS | Status: DC | PRN
Start: 2023-06-29 — End: 2023-06-29

## 2023-06-29 MED ORDER — OXYCODONE HCL 5 MG/5ML PO SOLN: 5.0000 mg | Freq: Once | ORAL | Status: DC | PRN

## 2023-06-29 MED ORDER — LACTATED RINGERS IV SOLN: INTRAVENOUS | Status: DC

## 2023-06-29 MED ORDER — BUPIVACAINE HCL (PF) 0.25 % IJ SOLN
INTRAMUSCULAR | Status: AC
Start: 2023-06-29 — End: 2023-06-29
  Filled 2023-06-29: qty 30

## 2023-06-29 MED ORDER — 0.9 % SODIUM CHLORIDE (POUR BTL) OPTIME
TOPICAL | Status: DC | PRN
  Administered 2023-06-29: 1000 mL

## 2023-06-29 MED ORDER — ACETAMINOPHEN 325 MG PO TABS: 650.0000 mg | ORAL_TABLET | ORAL | Status: DC | PRN

## 2023-06-29 MED ORDER — EPHEDRINE SULFATE-NACL 50-0.9 MG/10ML-% IV SOSY
PREFILLED_SYRINGE | INTRAVENOUS | Status: DC | PRN
  Administered 2023-06-29 (×2): 10 mg via INTRAVENOUS

## 2023-06-29 MED ORDER — FENTANYL CITRATE (PF) 100 MCG/2ML IJ SOLN
INTRAMUSCULAR | Status: AC
  Filled 2023-06-29: qty 2

## 2023-06-29 MED ORDER — DEXAMETHASONE SODIUM PHOSPHATE 10 MG/ML IJ SOLN
INTRAMUSCULAR | Status: DC | PRN
  Administered 2023-06-29: 4 mg via INTRAVENOUS

## 2023-06-29 MED ORDER — FENTANYL CITRATE (PF) 100 MCG/2ML IJ SOLN
INTRAMUSCULAR | Status: AC
Start: 2023-06-29 — End: 2023-06-29
  Filled 2023-06-29: qty 2

## 2023-06-29 MED ORDER — MORPHINE SULFATE (PF) 2 MG/ML IV SOLN: 2.0000 mg | INTRAVENOUS | Status: DC | PRN

## 2023-06-29 MED ORDER — BUPIVACAINE HCL 0.25 % IJ SOLN
INTRAMUSCULAR | Status: DC | PRN
  Administered 2023-06-29: 9 mL

## 2023-06-29 MED ORDER — PROPOFOL 1000 MG/100ML IV EMUL
INTRAVENOUS | Status: AC
  Filled 2023-06-29: qty 100

## 2023-06-29 MED ORDER — CELECOXIB 200 MG PO CAPS
200.0000 mg | ORAL_CAPSULE | Freq: Once | ORAL | Status: AC
  Administered 2023-06-29: 200 mg via ORAL
  Filled 2023-06-29: qty 1

## 2023-06-29 MED ORDER — ONDANSETRON HCL 4 MG/2ML IJ SOLN
INTRAMUSCULAR | Status: DC | PRN
  Administered 2023-06-29: 4 mg via INTRAVENOUS

## 2023-06-29 MED ORDER — OXYCODONE HCL 5 MG PO TABS: 5.0000 mg | ORAL_TABLET | Freq: Once | ORAL | Status: DC | PRN

## 2023-06-29 MED ORDER — MIDAZOLAM HCL 2 MG/2ML IJ SOLN
INTRAMUSCULAR | Status: AC
Start: 2023-06-29 — End: 2023-06-29
  Filled 2023-06-29: qty 2

## 2023-06-29 MED ORDER — ONDANSETRON HCL 4 MG/2ML IJ SOLN: 4.0000 mg | Freq: Once | INTRAMUSCULAR | Status: DC | PRN

## 2023-06-29 MED ORDER — ACETAMINOPHEN 650 MG RE SUPP: 650.0000 mg | RECTAL | Status: DC | PRN

## 2023-06-29 MED ORDER — ORAL CARE MOUTH RINSE: 15.0000 mL | Freq: Once | OROMUCOSAL | Status: AC

## 2023-06-29 MED ORDER — CEFAZOLIN SODIUM-DEXTROSE 2-4 GM/100ML-% IV SOLN
2.0000 g | INTRAVENOUS | Status: AC
  Administered 2023-06-29: 2 g via INTRAVENOUS
  Filled 2023-06-29: qty 100

## 2023-06-29 MED ORDER — MIDAZOLAM HCL 2 MG/2ML IJ SOLN
INTRAMUSCULAR | Status: DC | PRN
  Administered 2023-06-29: 2 mg via INTRAVENOUS

## 2023-06-29 MED ORDER — DEXMEDETOMIDINE HCL IN NACL 80 MCG/20ML IV SOLN
INTRAVENOUS | Status: AC
  Filled 2023-06-29: qty 20

## 2023-06-29 MED ORDER — PROPOFOL 10 MG/ML IV BOLUS
INTRAVENOUS | Status: DC | PRN
  Administered 2023-06-29: 200 mg via INTRAVENOUS

## 2023-06-29 MED ORDER — ACETAMINOPHEN 500 MG PO TABS
1000.0000 mg | ORAL_TABLET | Freq: Once | ORAL | Status: AC
  Administered 2023-06-29: 1000 mg via ORAL
  Filled 2023-06-29: qty 2

## 2023-06-29 MED ORDER — FENTANYL CITRATE PF 50 MCG/ML IJ SOSY: 25.0000 ug | PREFILLED_SYRINGE | INTRAMUSCULAR | Status: DC | PRN

## 2023-06-29 MED ORDER — DEXAMETHASONE SODIUM PHOSPHATE 10 MG/ML IJ SOLN
INTRAMUSCULAR | Status: AC
  Filled 2023-06-29: qty 1

## 2023-06-29 MED ORDER — PROPOFOL 500 MG/50ML IV EMUL
INTRAVENOUS | Status: DC | PRN
  Administered 2023-06-29: 200 ug/kg/min via INTRAVENOUS

## 2023-06-29 MED ORDER — PROPOFOL 1000 MG/100ML IV EMUL
INTRAVENOUS | Status: AC
Start: 2023-06-29 — End: 2023-06-29
  Filled 2023-06-29: qty 100

## 2023-06-29 MED ORDER — OXYCODONE-ACETAMINOPHEN 5-325 MG PO TABS: 1.0000 | ORAL_TABLET | Freq: Four times a day (QID) | ORAL | 0 refills | Status: AC | PRN

## 2023-06-29 SURGICAL SUPPLY — 31 items
BAG COUNTER SPONGE SURGICOUNT (BAG) IMPLANT
BENZOIN TINCTURE PRP APPL 2/3 (GAUZE/BANDAGES/DRESSINGS) ×1 IMPLANT
BLADE SURG 15 STRL LF DISP TIS (BLADE) ×1 IMPLANT
BNDG GAUZE DERMACEA FLUFF 4 (GAUZE/BANDAGES/DRESSINGS) ×1 IMPLANT
DRAIN PENROSE 0.25X18 (DRAIN) IMPLANT
DRAIN PENROSE 0.5X18 (DRAIN) IMPLANT
DRAPE LAPAROTOMY T 98X78 PEDS (DRAPES) ×1 IMPLANT
DRAPE UTILITY XL STRL (DRAPES) ×1 IMPLANT
ELECT PENCIL ROCKER SW 15FT (MISCELLANEOUS) ×1 IMPLANT
ELECT REM PT RETURN 15FT ADLT (MISCELLANEOUS) ×1 IMPLANT
GAUZE 4X4 16PLY ~~LOC~~+RFID DBL (SPONGE) ×1 IMPLANT
GAUZE SPONGE 4X4 12PLY STRL (GAUZE/BANDAGES/DRESSINGS) ×1 IMPLANT
GLOVE SURG SS PI 8.0 STRL IVOR (GLOVE) ×1 IMPLANT
GOWN STRL REUS W/ TWL XL LVL3 (GOWN DISPOSABLE) ×1 IMPLANT
KIT BASIN OR (CUSTOM PROCEDURE TRAY) ×1 IMPLANT
KIT TURNOVER KIT A (KITS) ×1 IMPLANT
NDL HYPO 22X1.5 SAFETY MO (MISCELLANEOUS) ×1 IMPLANT
NEEDLE HYPO 22X1.5 SAFETY MO (MISCELLANEOUS) ×1 IMPLANT
NS IRRIG 1000ML POUR BTL (IV SOLUTION) IMPLANT
PACK BASIC VI WITH GOWN DISP (CUSTOM PROCEDURE TRAY) ×1 IMPLANT
SOL PREP POV-IOD 4OZ 10% (MISCELLANEOUS) ×1 IMPLANT
SPIKE FLUID TRANSFER (MISCELLANEOUS) ×1 IMPLANT
SUPPORTER AHLETIC TETRA LG (SOFTGOODS) ×1 IMPLANT
SUT CHROMIC 3 0 SH 27 (SUTURE) ×2 IMPLANT
SUT CHROMIC 4 0 SH 27 (SUTURE) IMPLANT
SUT VIC AB 3-0 SH 27XBRD (SUTURE) ×1 IMPLANT
SUT VICRYL 0 TIES 12 18 (SUTURE) ×1 IMPLANT
SYR CONTROL 10ML LL (SYRINGE) ×1 IMPLANT
TOWEL OR 17X26 10 PK STRL BLUE (TOWEL DISPOSABLE) ×1 IMPLANT
WATER STERILE IRR 1000ML POUR (IV SOLUTION) IMPLANT
YANKAUER SUCT BULB TIP 10FT TU (MISCELLANEOUS) ×1 IMPLANT

## 2023-06-29 NOTE — Anesthesia Procedure Notes (Signed)
 Procedure Name: LMA Insertion Date/Time: 06/29/2023 7:29 AM  Performed by: Brandy Almarie BROCKS, CRNAPre-anesthesia Checklist: Patient identified, Emergency Drugs available, Suction available and Patient being monitored Patient Re-evaluated:Patient Re-evaluated prior to induction Oxygen Delivery Method: Circle system utilized Preoxygenation: Pre-oxygenation with 100% oxygen Induction Type: IV induction LMA: LMA inserted LMA Size: 4.0 Number of attempts: 1 Dental Injury: Teeth and Oropharynx as per pre-operative assessment

## 2023-06-29 NOTE — Transfer of Care (Signed)
 Immediate Anesthesia Transfer of Care Note  Patient: Curtis [name]  Procedure(s) Performed: LEFT HYRDOCELECTOMY, RIGHT HYDROCELECTOMY (Bilateral: Scrotum)  Patient Location: PACU  Anesthesia Type:General  Level of Consciousness: drowsy  Airway & Oxygen Therapy: Patient Spontanous Breathing and Patient connected to face mask oxygen  Post-op Assessment: Report given to RN, Post -op Vital signs reviewed and stable, and Patient moving all extremities X 4  Post vital signs: Reviewed and stable  Last Vitals:  Vitals Value Taken Time  BP 101/60 06/29/23 08:48  Temp    Pulse 62 06/29/23 08:49  Resp 11 06/29/23 08:49  SpO2 100 % 06/29/23 08:49  Vitals shown include unfiled device data.  Last Pain:  Vitals:   06/29/23 0606  TempSrc:   PainSc: 7       Patients Stated Pain Goal: 5 (06/29/23 0600)  Complications: No notable events documented.

## 2023-06-29 NOTE — Op Note (Addendum)
 Operative Note  Preoperative diagnosis:  1.  Bilateral Hydroceles  Postoperative diagnosis: 1.  Bilateral Hydroceles  Procedure(s): 1.  Bilateral Hydrocelectomy  Surgeon: Norleen Seltzer, MD  Assistants:  Jacqulyn Bound, MD  Anesthesia:  General  Complications:  None  EBL:  Minimal  Specimens: 1. None  Drains/Catheters: 1.  Penrose in left hemi-scrotum, patient to remove at home tomorrow  Intraoperative findings:   Large left hydrocele, successful hydrocelectomy with drain placement Smaller right hydrocele successfully removed  Indication:  Curtis [name] is a 48 y.o. male with bilateral hydroceles presenting for hydrocelectomy.  After thorough discussion including all relevant risk but this alternatives, he presents the operating for the above procedure.  Description of procedure: The indications, alternatives, benefits, and risks were discussed with the patient and informed was obtained.  The patient was brought onto the operating room table, positioned supine, and secured with a safety strap.  All pressure points were carefully padded and pneumatic compression devices were placed on the lower extremities.  After the administration of intravenous antibiotics and general anesthesia, the patient scrotum was examined and the bilateral hydroceles were palpated.  The lower abdomen and external genitalia were prepped and draped in the standard sterile manner.  A timeout was completed, verifying the correct patient, surgical procedure, site, and positioning, prior to beginning the procedure.  A skin incision was made over the mid scrotum.  The dartos fascia was incised, exposing the left hydrocele.  The sac was separated from the scrotal wall with sharp and blunt dissection.  Using manual pressure, the hydrocele and testicle were delivered through the incision and dissected from the spermatic cord in an inferior to superior dissection.  The hydrocele sac was carefully freed from the  surrounding tissue with a moist sponge, cleanly exposing the parietal layer of the tunica vaginalis.  The hydrocele was incised straw-colored fluid was aspirated.  The redundant hydrocele sac was excised. The edges of the sac were then everted loosely behind the testis and surgery to each other using a running 3-0 chromic suture.  All bleeding points were cauterized, maintaining meticulous hemostasis.  The testis, epididymis and spermatic cord were inspected and found to be intact without any evidence of injury.  There were carefully placed back into the scrotum, in their normal anatomic position, making sure that the spermatic cord was not twisted.  A 0.25 inch Penrose drain was placed through a separate scrotal stab incision  We then turned our attention to the right side and performed the same procedure  The dartos fascia was closed with a running 3-0 chromic suture.  The scrotal skin was closed with running vertical mattress 3-0 chromic sutures.  The incision was covered with a sterile dressing and gauze fluffs, and an athletic supporter was applied to minimize swelling.  At the end of procedure all counts were correct.  The patient tolerated the procedure well and was taken to the recovery room stable condition.  Plan:  Discharge home. Patient will plan to remove drain at home tomorrow  Dr. Seltzer was the attending of record

## 2023-06-29 NOTE — Interval H&P Note (Signed)
 History and Physical Interval Note:  He would like to have the smaller right hydrocele repaired as well.  I reviewed the risks of the additional procedure.    06/29/2023 7:14 AM  Curtis [name]  has presented today for surgery, with the diagnosis of LEFT HYDROCELE.  The various methods of treatment have been discussed with the patient and family. After consideration of risks, benefits and other options for treatment, the patient has consented to  Procedure(s): HYDROCELECTOMY (Left) as a surgical intervention.  The patient's history has been reviewed, patient examined, no change in status, stable for surgery.  I have reviewed the patient's chart and labs.  Questions were answered to the patient's satisfaction.     Jolynne Spurgin

## 2023-06-29 NOTE — Anesthesia Postprocedure Evaluation (Signed)
 Anesthesia Post Note  Patient: Curtis [name]  Procedure(s) Performed: LEFT HYRDOCELECTOMY, RIGHT HYDROCELECTOMY (Bilateral: Scrotum)     Patient location during evaluation: PACU Anesthesia Type: General Level of consciousness: awake and alert Pain management: pain level controlled Vital Signs Assessment: post-procedure vital signs reviewed and stable Respiratory status: spontaneous breathing, nonlabored ventilation, respiratory function stable and patient connected to nasal cannula oxygen Cardiovascular status: blood pressure returned to baseline and stable Postop Assessment: no apparent nausea or vomiting Anesthetic complications: no   No notable events documented.  Last Vitals:  Vitals:   06/29/23 0848 06/29/23 0900  BP: 101/60 (!) 98/58  Pulse: 63 61  Resp: 11 12  Temp:    SpO2: 100% 100%    Last Pain:  Vitals:   06/29/23 0606  TempSrc:   PainSc: 7                  Prynce Jacober

## 2023-06-29 NOTE — Discharge Instructions (Addendum)
 You may remove the drain in the morning.  It should just slide out and if it doesn't please call the office to have it removed.

## 2023-06-30 ENCOUNTER — Encounter (HOSPITAL_COMMUNITY): Payer: Self-pay | Admitting: Urology

## 2023-07-22 ENCOUNTER — Other Ambulatory Visit: Payer: Self-pay | Admitting: Pulmonary Disease

## 2023-09-07 ENCOUNTER — Encounter: Payer: Self-pay | Admitting: Internal Medicine

## 2023-09-07 ENCOUNTER — Ambulatory Visit: Admitting: Internal Medicine

## 2023-09-07 VITALS — BP 126/78 | HR 75 | Temp 97.7°F | Ht 73.0 in | Wt 171.8 lb

## 2023-09-07 DIAGNOSIS — E559 Vitamin D deficiency, unspecified: Secondary | ICD-10-CM | POA: Diagnosis not present

## 2023-09-07 DIAGNOSIS — R972 Elevated prostate specific antigen [PSA]: Secondary | ICD-10-CM

## 2023-09-07 DIAGNOSIS — E538 Deficiency of other specified B group vitamins: Secondary | ICD-10-CM

## 2023-09-07 DIAGNOSIS — R739 Hyperglycemia, unspecified: Secondary | ICD-10-CM | POA: Diagnosis not present

## 2023-09-07 DIAGNOSIS — Z0001 Encounter for general adult medical examination with abnormal findings: Secondary | ICD-10-CM | POA: Diagnosis not present

## 2023-09-07 DIAGNOSIS — F419 Anxiety disorder, unspecified: Secondary | ICD-10-CM

## 2023-09-07 DIAGNOSIS — E782 Mixed hyperlipidemia: Secondary | ICD-10-CM | POA: Diagnosis not present

## 2023-09-07 NOTE — Assessment & Plan Note (Signed)
 Lab Results  Component Value Date   VITAMINB12 734 01/25/2023   Stable, cont oral replacement - b12 1000 mcg qd

## 2023-09-07 NOTE — Assessment & Plan Note (Signed)
 Lab Results  Component Value Date   LDLCALC 180 (H) 01/25/2023   Severe uncontrolled,, pt for lower chol diet, declines statin for now

## 2023-09-07 NOTE — Assessment & Plan Note (Signed)
 Chronic mild it seems, pt declines need for change in tx today or counseling

## 2023-09-07 NOTE — Assessment & Plan Note (Signed)
 Age and sex appropriate education and counseling updated with regular exercise and diet Referrals for preventative services - none needed Immunizations addressed - declines all vax Smoking counseling  - none needed Evidence for depression or other mood disorder - chronic anxiety stable Most recent labs reviewed. I have personally reviewed and have noted: 1) the patient's medical and social history 2) The patient's current medications and supplements 3) The patient's height, weight, and BMI have been recorded in the chart

## 2023-09-07 NOTE — Assessment & Plan Note (Signed)
 Last vitamin D  Lab Results  Component Value Date   VD25OH 33.40 01/25/2023   Low, to start oral replacement

## 2023-09-07 NOTE — Progress Notes (Signed)
 Patient ID: [name], male   DOB: [birth year], 48 y.o.   MRN: [id number]         Chief Complaint:: wellness exam and Post-op Problem (Post-op problem)  Lower abd pains, allergies, low b12, anxiety, hld, hyperglycemia, low vit d       HPI:  Curtis [name] is a 48 y.o. male here for wellness exam; declines all vax o/w up to date                        Also s/p left hydrolectomy may 2025, pathology c/w benign.  Anal papilla removed. Does have several wks ongoing nasal allergy symptoms with clearish congestion, itch and sneezing, without fever, pain, ST, cough, swelling or wheezing.  Pt denies chest pain, increased sob or doe, wheezing, orthopnea, PND, increased LE swelling, palpitations, dizziness or syncope.   Pt denies polydipsia, polyuria, or new focal neuro s/s. Denies worsening reflux, dysphagia, n/v, or blood, though has contd IBS he treats with daily cannabis.  Does have mild worsening lower abd pains fleeting and transient, hoping for urine testing with labs today. Did also have recent plantar fasciitis bilateral now resolved.  Denies urinary symptoms such as dysuria, frequency, urgency, flank pain, hematuria or n/v, fever, chills though still has some left scrotal pains post op.     Wt Readings from Last 3 Encounters:  09/07/23 171 lb 12.8 oz (77.9 kg)  06/29/23 170 lb (77.1 kg)  06/19/23 170 lb (77.1 kg)   BP Readings from Last 3 Encounters:  09/07/23 126/78  06/29/23 128/87  06/19/23 120/86   Immunization History  Administered Date(s) Administered   Influenza Split 12/07/2010, 12/05/2011   Influenza,inj,Quad PF,6+ Mos 08/20/2013   Td 01/02/2006   Tdap 08/09/2018   There are no preventive care reminders to display for this patient.     Past Medical History:  Diagnosis Date   ACNE, CYSTIC    resolved   Arthritis    spine, early onset   Asthma    IBS (irritable bowel syndrome)    Inguinal hernia    INGUINAL HERNIA, RIGHT 04/24/2008   s/p RIH repair    Lactose intolerance    diet controlled   PONV (postoperative nausea and vomiting)    Prostatitis    Past Surgical History:  Procedure Laterality Date   COLONOSCOPY  2018   HERNIA REPAIR Right 2010   Dr. Sebastian   HYDROCELE EXCISION Bilateral 06/29/2023   Procedure: LEFT HYRDOCELECTOMY, RIGHT HYDROCELECTOMY;  Surgeon: Curtis Rush, MD;  Location: [facility];  Service: Urology;  Laterality: Bilateral;   INGUINAL HERNIA REPAIR Bilateral 03/14/2018   Procedure: LAPAROSCOPIC BILATERAL  INGUINAL HERNIA REPAIR  WITH MESH ERAS PAHTWAY;  Surgeon: Curtis Standing, MD;  Location: [facility];  Service: General;  Laterality: Bilateral;   SKIN TAG REMOVAL     Surgery for spont pneumothorax Right 1996   UMBILICAL HERNIA REPAIR N/A 03/14/2018   Procedure: HERNIA REPAIR UMBILICAL ADULT;  Surgeon: Curtis Standing, MD;  Location: [facility];  Service: General;  Laterality: N/A;   WISDOM TOOTH EXTRACTION      reports that he has never smoked. He has never been exposed to tobacco smoke. He has never used smokeless tobacco. He reports that he does not currently use alcohol. He reports current drug use. Drug: Marijuana. family history includes Stroke (age of onset: 52) in his maternal grandmother; Urolithiasis in his father. Allergies  Allergen Reactions   Levofloxacin   Other (See Comments)    GI problems   Current Outpatient Medications on File Prior to Visit  Medication Sig Dispense Refill   fluticasone  furoate-vilanterol (BREO ELLIPTA ) 200-25 MCG/ACT AEPB Inhale 1 puff into the lungs daily. 60 each 6   mesalamine  (LIALDA ) 1.2 g EC tablet TAKE 2 TABLETS BY MOUTH TWICE A DAY 360 tablet 1   oxyCODONE -acetaminophen  (PERCOCET) 5-325 MG tablet Take 1 tablet by mouth every 6 (six) hours as needed for severe pain (pain score 7-10). 8 tablet 0   Probiotic Product (PROBIOTIC PO) Take 1 capsule by mouth daily.     TURMERIC-FISH OIL PO Take 1 capsule by mouth daily.     VITAMIN D -VITAMIN K PO  Take 1 tablet by mouth daily.     No current facility-administered medications on file prior to visit.        ROS:  All others reviewed and negative.  Objective        PE:  BP 126/78   Pulse 75   Temp 97.7 F (36.5 C)   Ht 6' 1 (1.854 m)   Wt 171 lb 12.8 oz (77.9 kg)   SpO2 98%   BMI 22.67 kg/m                 Constitutional: Pt appears in NAD               HENT: Head: NCAT.                Right Ear: External ear normal.                 Left Ear: External ear normal.                Eyes: . Pupils are equal, round, and reactive to light. Conjunctivae and EOM are normal               Nose: without d/c or deformity               Neck: Neck supple. Gross normal ROM               Cardiovascular: Normal rate and regular rhythm.                 Pulmonary/Chest: Effort normal and breath sounds without rales or wheezing.                Abd:  Soft, NT, ND, + BS, no organomegaly               Neurological: Pt is alert. At baseline orientation, motor grossly intact               Skin: Skin is warm. No rashes, no other new lesions, LE edema - none               Psychiatric: Pt behavior is normal without agitation , mod nervous and pressure speech  Micro: none  Cardiac tracings I have personally interpreted today:  none  Pertinent Radiological findings (summarize): none   Lab Results  Component Value Date   WBC 10.0 06/19/2023   HGB 14.8 06/19/2023   HCT 45.0 06/19/2023   PLT 280 06/19/2023   GLUCOSE 122 (H) 05/31/2023   CHOL 248 (H) 01/25/2023   TRIG 179.0 (H) 01/25/2023   HDL 32.80 (L) 01/25/2023   LDLDIRECT 150.0 06/22/2022   LDLCALC 180 (H) 01/25/2023   ALT 21 01/25/2023   AST 19 01/25/2023   NA 136 05/31/2023  K 3.6 05/31/2023   CL 101 05/31/2023   CREATININE 0.92 05/31/2023   BUN 13 05/31/2023   CO2 28 05/31/2023   TSH 1.95 01/25/2023   PSA 1.98 01/25/2023   HGBA1C 6.0 01/25/2023   Assessment/Plan:  Curtis [name] is a 48 y.o. White or Caucasian [1] male  with  has a past medical history of ACNE, CYSTIC, Arthritis, Asthma, IBS (irritable bowel syndrome), Inguinal hernia, INGUINAL HERNIA, RIGHT (04/24/2008), Lactose intolerance, PONV (postoperative nausea and vomiting), and Prostatitis.  Encounter for well adult exam with abnormal findings Age and sex appropriate education and counseling updated with regular exercise and diet Referrals for preventative services - none needed Immunizations addressed - declines all vax Smoking counseling  - none needed Evidence for depression or other mood disorder - chronic anxiety stable Most recent labs reviewed. I have personally reviewed and have noted: 1) the patient's medical and social history 2) The patient's current medications and supplements 3) The patient's height, weight, and BMI have been recorded in the chart   Anxiety Chronic mild it seems, pt declines need for change in tx today or counseling  B12 deficiency Lab Results  Component Value Date   VITAMINB12 734 01/25/2023   Stable, cont oral replacement - b12 1000 mcg qd   Elevated PSA Asympt, for f/u psa with labs  HLD (hyperlipidemia) Lab Results  Component Value Date   LDLCALC 180 (H) 01/25/2023   Severe uncontrolled,, pt for lower chol diet, declines statin for now   Hyperglycemia Lab Results  Component Value Date   HGBA1C 6.0 01/25/2023   Stable, pt to continue current medical treatment  - diet, wt control   Vitamin D  deficiency Last vitamin D  Lab Results  Component Value Date   VD25OH 33.40 01/25/2023   Low, to start oral replacement  Followup: Return in about 1 year (around 09/06/2024).  Lynwood Rush, MD 09/07/2023 12:10 PM  [facility]

## 2023-09-07 NOTE — Assessment & Plan Note (Signed)
 Lab Results  Component Value Date   HGBA1C 6.0 01/25/2023   Stable, pt to continue current medical treatment  - diet, wt control

## 2023-09-07 NOTE — Patient Instructions (Signed)

## 2023-09-07 NOTE — Assessment & Plan Note (Signed)
Asympt, for f/u psa with labs

## 2023-09-11 ENCOUNTER — Other Ambulatory Visit (INDEPENDENT_AMBULATORY_CARE_PROVIDER_SITE_OTHER)

## 2023-09-11 ENCOUNTER — Other Ambulatory Visit: Payer: Self-pay | Admitting: Internal Medicine

## 2023-09-11 ENCOUNTER — Ambulatory Visit: Payer: Self-pay | Admitting: Internal Medicine

## 2023-09-11 DIAGNOSIS — R739 Hyperglycemia, unspecified: Secondary | ICD-10-CM | POA: Diagnosis not present

## 2023-09-11 DIAGNOSIS — E538 Deficiency of other specified B group vitamins: Secondary | ICD-10-CM | POA: Diagnosis not present

## 2023-09-11 DIAGNOSIS — R972 Elevated prostate specific antigen [PSA]: Secondary | ICD-10-CM

## 2023-09-11 DIAGNOSIS — E559 Vitamin D deficiency, unspecified: Secondary | ICD-10-CM

## 2023-09-11 DIAGNOSIS — E782 Mixed hyperlipidemia: Secondary | ICD-10-CM | POA: Diagnosis not present

## 2023-09-11 LAB — LIPID PANEL
Cholesterol: 236 mg/dL — ABNORMAL HIGH (ref 0–200)
HDL: 28.3 mg/dL — ABNORMAL LOW (ref >39.00)
LDL Cholesterol: 165 mg/dL — ABNORMAL HIGH (ref 0–99)
NonHDL: 207.44
Total CHOL/HDL Ratio: 8
Triglycerides: 212 mg/dL — ABNORMAL HIGH (ref 0.0–149.0)
VLDL: 42.4 mg/dL — ABNORMAL HIGH (ref 0.0–40.0)

## 2023-09-11 LAB — HEPATIC FUNCTION PANEL
ALT: 27 U/L (ref 0–53)
AST: 21 U/L (ref 0–37)
Albumin: 4.3 g/dL (ref 3.5–5.2)
Alkaline Phosphatase: 72 U/L (ref 39–117)
Bilirubin, Direct: 0.1 mg/dL (ref 0.0–0.3)
Total Bilirubin: 0.4 mg/dL (ref 0.2–1.2)
Total Protein: 6.9 g/dL (ref 6.0–8.3)

## 2023-09-11 LAB — BASIC METABOLIC PANEL WITH GFR
BUN: 13 mg/dL (ref 6–23)
CO2: 29 meq/L (ref 19–32)
Calcium: 9.3 mg/dL (ref 8.4–10.5)
Chloride: 103 meq/L (ref 96–112)
Creatinine, Ser: 0.95 mg/dL (ref 0.40–1.50)
GFR: 94.17 mL/min (ref >60.00)
Glucose, Bld: 109 mg/dL — ABNORMAL HIGH (ref 70–99)
Potassium: 4.2 meq/L (ref 3.5–5.1)
Sodium: 139 meq/L (ref 135–145)

## 2023-09-11 LAB — URINALYSIS, ROUTINE W REFLEX MICROSCOPIC
Bilirubin Urine: NEGATIVE
Ketones, ur: NEGATIVE
Leukocytes,Ua: NEGATIVE
Nitrite: NEGATIVE
Specific Gravity, Urine: <=1.005 — AB (ref 1.000–1.030)
Total Protein, Urine: NEGATIVE
Urine Glucose: NEGATIVE
Urobilinogen, UA: 0.2 (ref 0.0–1.0)
pH: 6 (ref 5.0–8.0)

## 2023-09-11 LAB — CBC WITH DIFFERENTIAL/PLATELET
Basophils Absolute: 0 K/uL (ref 0.0–0.1)
Basophils Relative: 0.5 % (ref 0.0–3.0)
Eosinophils Absolute: 0.2 K/uL (ref 0.0–0.7)
Eosinophils Relative: 2.3 % (ref 0.0–5.0)
HCT: 44.3 % (ref 39.0–52.0)
Hemoglobin: 14.9 g/dL (ref 13.0–17.0)
Lymphocytes Relative: 26.2 % (ref 12.0–46.0)
Lymphs Abs: 2.3 K/uL (ref 0.7–4.0)
MCHC: 33.5 g/dL (ref 30.0–36.0)
MCV: 89 fl (ref 78.0–100.0)
Monocytes Absolute: 0.8 K/uL (ref 0.1–1.0)
Monocytes Relative: 9.5 % (ref 3.0–12.0)
Neutro Abs: 5.3 K/uL (ref 1.4–7.7)
Neutrophils Relative %: 61.5 % (ref 43.0–77.0)
Platelets: 272 K/uL (ref 150.0–400.0)
RBC: 4.98 Mil/uL (ref 4.22–5.81)
RDW: 13.6 % (ref 11.5–15.5)
WBC: 8.6 K/uL (ref 4.0–10.5)

## 2023-09-11 LAB — TSH: TSH: 1.69 u[IU]/mL (ref 0.35–5.50)

## 2023-09-11 LAB — VITAMIN B12: Vitamin B-12: 432 pg/mL (ref 211–911)

## 2023-09-11 LAB — HEMOGLOBIN A1C: Hgb A1c MFr Bld: 6 % (ref 4.6–6.5)

## 2023-09-11 LAB — PSA: PSA: 4.43 ng/mL — ABNORMAL HIGH (ref 0.10–4.00)

## 2023-09-11 LAB — VITAMIN D 25 HYDROXY (VIT D DEFICIENCY, FRACTURES): VITD: 34.29 ng/mL (ref 30.00–100.00)

## 2023-09-11 MED ORDER — DOXYCYCLINE HYCLATE 100 MG PO TABS: 100.0000 mg | ORAL_TABLET | Freq: Two times a day (BID) | ORAL | 0 refills | Status: DC

## 2023-09-21 ENCOUNTER — Ambulatory Visit: Admitting: Internal Medicine

## 2023-10-15 ENCOUNTER — Ambulatory Visit (INDEPENDENT_AMBULATORY_CARE_PROVIDER_SITE_OTHER): Admitting: Internal Medicine

## 2023-10-15 ENCOUNTER — Encounter: Payer: Self-pay | Admitting: Internal Medicine

## 2023-10-15 VITALS — BP 122/80 | HR 65 | Temp 98.7°F | Ht 73.0 in | Wt 175.0 lb

## 2023-10-15 DIAGNOSIS — E559 Vitamin D deficiency, unspecified: Secondary | ICD-10-CM

## 2023-10-15 DIAGNOSIS — Z202 Contact with and (suspected) exposure to infections with a predominantly sexual mode of transmission: Secondary | ICD-10-CM

## 2023-10-15 DIAGNOSIS — R972 Elevated prostate specific antigen [PSA]: Secondary | ICD-10-CM

## 2023-10-15 DIAGNOSIS — R739 Hyperglycemia, unspecified: Secondary | ICD-10-CM | POA: Diagnosis not present

## 2023-10-15 NOTE — Patient Instructions (Signed)
 Please continue all other medications as before, and refills have been done if requested.  Please have the pharmacy call with any other refills you may need.  Please continue your efforts at being more active, low cholesterol diet, and weight control.  Please keep your appointments with your specialists as you may have planned - psa oct 27 with urlogy  Please go to the LAB at the blood drawing area for the tests to be done  You will be contacted by phone if any changes need to be made immediately.  Otherwise, you will receive a letter about your results with an explanation, but please check with MyChart first.

## 2023-10-15 NOTE — Assessment & Plan Note (Signed)
 Lab Results  Component Value Date   HGBA1C 6.0 09/11/2023   Stable, pt to continue current medical treatment  - diet, wt control

## 2023-10-15 NOTE — Assessment & Plan Note (Signed)
 Last vitamin D  Lab Results  Component Value Date   VD25OH 34.29 09/11/2023   Low, to start oral replacement

## 2023-10-15 NOTE — Assessment & Plan Note (Signed)
 Also for STD testing as requested

## 2023-10-15 NOTE — Assessment & Plan Note (Signed)
 Also for f/u psa with urology oct 27 after extended course antibiotic for chronic prostatitis.

## 2023-10-15 NOTE — Progress Notes (Signed)
 Patient ID: Curtis [name], male   DOB: August 05, 1975, 48 y.o.   MRN: [id number]        Chief Complaint: follow up prostatitis, STD exposure, low vit d, hyperglycemia       HPI:  Curtis [name] is a 48 y.o. male here overall doing ok, with improved lower abd pelvic pain after extended recent oral antibiotic per urology, with last dose tomorrow, then plans to f/u psa with urology oct 27.  Denies urinary symptoms such as dysuria, frequency, urgency, flank pain, hematuria or n/v, fever, chills.   Pt denies polydipsia, polyuria, or new focal neuro s/s.    Pt denies fever, wt loss, night sweats, loss of appetite, or other constitutional symptoms   Has new girlfriend without intercourse yet, who asks that he have STD testing first.        Wt Readings from Last 3 Encounters:  10/15/23 175 lb (79.4 kg)  09/07/23 171 lb 12.8 oz (77.9 kg)  06/29/23 170 lb (77.1 kg)   BP Readings from Last 3 Encounters:  10/15/23 122/80  09/07/23 126/78  06/29/23 128/87         Past Medical History:  Diagnosis Date   ACNE, CYSTIC    resolved   Arthritis    spine, early onset   Asthma    IBS (irritable bowel syndrome)    Inguinal hernia    INGUINAL HERNIA, RIGHT 04/24/2008   s/p RIH repair   Lactose intolerance    diet controlled   PONV (postoperative nausea and vomiting)    Prostatitis    Past Surgical History:  Procedure Laterality Date   COLONOSCOPY  2018   HERNIA REPAIR Right 2010   Dr. Sebastian   HYDROCELE EXCISION Bilateral 06/29/2023   Procedure: LEFT HYRDOCELECTOMY, RIGHT HYDROCELECTOMY;  Surgeon: Watt Rush, MD;  Location: [facility];  Service: Urology;  Laterality: Bilateral;   INGUINAL HERNIA REPAIR Bilateral 03/14/2018   Procedure: LAPAROSCOPIC BILATERAL  INGUINAL HERNIA REPAIR  WITH MESH ERAS PAHTWAY;  Surgeon: Sheldon Standing, MD;  Location: [facility];  Service: General;  Laterality: Bilateral;   SKIN TAG REMOVAL     Surgery for spont pneumothorax Right 1996    UMBILICAL HERNIA REPAIR N/A 03/14/2018   Procedure: HERNIA REPAIR UMBILICAL ADULT;  Surgeon: Sheldon Standing, MD;  Location: [facility];  Service: General;  Laterality: N/A;   WISDOM TOOTH EXTRACTION      reports that he has never smoked. He has never been exposed to tobacco smoke. He has never used smokeless tobacco. He reports that he does not currently use alcohol. He reports current drug use. Drug: Marijuana. family history includes Stroke (age of onset: 70) in his maternal grandmother; Urolithiasis in his father. Allergies  Allergen Reactions   Levofloxacin  Other (See Comments)    GI problems   Current Outpatient Medications on File Prior to Visit  Medication Sig Dispense Refill   fluticasone  furoate-vilanterol (BREO ELLIPTA ) 200-25 MCG/ACT AEPB Inhale 1 puff into the lungs daily. 60 each 6   meloxicam (MOBIC) 15 MG tablet Take 15 mg by mouth daily as needed.     mesalamine  (LIALDA ) 1.2 g EC tablet TAKE 2 TABLETS BY MOUTH TWICE A DAY 360 tablet 1   oxyCODONE -acetaminophen  (PERCOCET) 5-325 MG tablet Take 1 tablet by mouth every 6 (six) hours as needed for severe pain (pain score 7-10). 8 tablet 0   Probiotic Product (PROBIOTIC PO) Take 1 capsule by mouth daily.     TURMERIC-FISH OIL  PO Take 1 capsule by mouth daily.     VITAMIN D -VITAMIN K PO Take 1 tablet by mouth daily.     doxycycline  (VIBRA -TABS) 100 MG tablet Take 1 tablet (100 mg total) by mouth 2 (two) times daily. (Patient not taking: Reported on 10/15/2023) 20 tablet 0   No current facility-administered medications on file prior to visit.        ROS:  All others reviewed and negative.  Objective        PE:  BP 122/80 (BP Location: Right Arm, Patient Position: Sitting, Cuff Size: Normal)   Pulse 65   Temp 98.7 F (37.1 C) (Oral)   Ht 6' 1 (1.854 m)   Wt 175 lb (79.4 kg)   SpO2 98%   BMI 23.09 kg/m                 Constitutional: Pt appears in NAD               HENT: Head: NCAT.                Right  Ear: External ear normal.                 Left Ear: External ear normal.                Eyes: . Pupils are equal, round, and reactive to light. Conjunctivae and EOM are normal               Nose: without d/c or deformity               Neck: Neck supple. Gross normal ROM               Cardiovascular: Normal rate and regular rhythm.                 Pulmonary/Chest: Effort normal and breath sounds without rales or wheezing.                Abd:  Soft, NT, ND, + BS, no organomegaly               Neurological: Pt is alert. At baseline orientation, motor grossly intact               Skin: Skin is warm. No rashes, no other new lesions, LE edema - none               Psychiatric: Pt behavior is normal without agitation , nervous  Micro: none  Cardiac tracings I have personally interpreted today:  none  Pertinent Radiological findings (summarize): none   Lab Results  Component Value Date   WBC 8.6 09/11/2023   HGB 14.9 09/11/2023   HCT 44.3 09/11/2023   PLT 272.0 09/11/2023   GLUCOSE 109 (H) 09/11/2023   CHOL 236 (H) 09/11/2023   TRIG 212.0 (H) 09/11/2023   HDL 28.30 (L) 09/11/2023   LDLDIRECT 150.0 06/22/2022   LDLCALC 165 (H) 09/11/2023   ALT 27 09/11/2023   AST 21 09/11/2023   NA 139 09/11/2023   K 4.2 09/11/2023   CL 103 09/11/2023   CREATININE 0.95 09/11/2023   BUN 13 09/11/2023   CO2 29 09/11/2023   TSH 1.69 09/11/2023   PSA 4.43 (H) 09/11/2023   HGBA1C 6.0 09/11/2023   Assessment/Plan:  Curtis [name] is a 48 y.o. White or Caucasian [1] male with  has a past medical history of ACNE, CYSTIC, Arthritis, Asthma, IBS (irritable bowel syndrome), Inguinal  hernia, INGUINAL HERNIA, RIGHT (04/24/2008), Lactose intolerance, PONV (postoperative nausea and vomiting), and Prostatitis.  Vitamin D  deficiency Last vitamin D  Lab Results  Component Value Date   VD25OH 34.29 09/11/2023   Low, to start oral replacement   Hyperglycemia Lab Results  Component Value Date   HGBA1C  6.0 09/11/2023   Stable, pt to continue current medical treatment  - diet, wt control   Elevated PSA Also for f/u psa with urology oct 27 after extended course antibiotic for chronic prostatitis.    STD exposure Also for STD testing as requested  Followup: Return if symptoms worsen or fail to improve.  Lynwood Rush, MD 10/15/2023 9:01 PM [facility]

## 2023-10-16 ENCOUNTER — Ambulatory Visit (INDEPENDENT_AMBULATORY_CARE_PROVIDER_SITE_OTHER): Admitting: Pulmonary Disease

## 2023-10-16 ENCOUNTER — Encounter: Payer: Self-pay | Admitting: Pulmonary Disease

## 2023-10-16 VITALS — BP 138/82 | HR 73 | Ht 73.0 in | Wt 176.4 lb

## 2023-10-16 DIAGNOSIS — J4540 Moderate persistent asthma, uncomplicated: Secondary | ICD-10-CM

## 2023-10-16 MED ORDER — FLUTICASONE FUROATE-VILANTEROL 200-25 MCG/ACT IN AEPB: 1.0000 | INHALATION_SPRAY | Freq: Every day | RESPIRATORY_TRACT | 4 refills | Status: AC

## 2023-10-16 MED ORDER — FLUTICASONE PROPIONATE 50 MCG/ACT NA SUSP: 1.0000 | Freq: Two times a day (BID) | NASAL | 2 refills | Status: DC

## 2023-10-16 NOTE — Patient Instructions (Addendum)
 Nice to see you again  [facility] refilled, continue using as needed, you may need it more regularly during the difficult pollen season, allergy season in the fall  Use Flonase  1 spray each nostril twice a day, see if this helps some with congestion.  Will avoid antihistamines and other nasal sprays that could affect prostate issues.  Return to clinic in 1 year or sooner as needed with Dr. Annella

## 2023-10-16 NOTE — Progress Notes (Signed)
 @Patient  ID: Curtis [name], male    DOB: 09-01-1975, 48 y.o.   MRN: [id number]  Chief Complaint  Patient presents with   Medical Management of Chronic Issues    Refill    Referring provider: Norleen Lynwood ORN, MD  HPI:   48 y.o. man whom we are seeing in follow-up for evaluation of chronic cough, chest discomfort felt to be related to underlying asthma.  Multiple PCP notes reviewed.  Overall doing well.  Had been using Breo as needed.  Typically follows work season.  Increased congestion over the last couple weeks.  Using Breo more frequently.  Seems to help.  Some nasal congestion as well.  Rhinorrhea, sinus pressure.  This affects his breathing as well.  Overall the Ranell seems to be very beneficial.   HPI initial visit Patient notes cough for long time.  At least a year, likely more.  Usually productive of phlegm.  Sometimes dark, foul tasting.  Seems worse in the evenings and mornings.  No position that makes things worse or better.  No seasonal environmental factors he can identify to make things better or worse.  No other alleviating or exacerbating factors.  He endorses a history of seasonal allergies.  Occasional nasal congestion.  However usually that congestion is of a different color and quality than his sputum he coughs up.  Also endorses heartburn.  Needs as needed antacids.  More recently using them more often over the last 1 to 2 months.  Reviewed most recent chest x-ray 01/2021 that on my review interpretation reveals clear lungs with evidence of hyperinflation both on the PA and lateral film.  Clear appearance with hyperinflation on my review and interpretation 06/21/2020 and 12/22/2014.   PMH: IBS Surgical history: Hernia repair Family history: Denies significant respiratory illness in first-degree relatives Social history: Does not smoke cigarettes, daily cannabis smoker, lives in [place] / Pulmonary Flowsheets:   ACT:      No data to display           MMRC:     No data to display          Epworth:      No data to display          Tests:   FENO:  No results found for: NITRICOXIDE  PFT:     No data to display          WALK:      No data to display          Imaging: Personally reviewed No results found.  Lab Results: Personally reviewed CBC    Component Value Date/Time   WBC 8.6 09/11/2023 0809   RBC 4.98 09/11/2023 0809   HGB 14.9 09/11/2023 0809   HCT 44.3 09/11/2023 0809   PLT 272.0 09/11/2023 0809   MCV 89.0 09/11/2023 0809   MCH 29.9 06/19/2023 1327   MCHC 33.5 09/11/2023 0809   RDW 13.6 09/11/2023 0809   LYMPHSABS 2.3 09/11/2023 0809   MONOABS 0.8 09/11/2023 0809   EOSABS 0.2 09/11/2023 0809   BASOSABS 0.0 09/11/2023 0809    BMET    Component Value Date/Time   NA 139 09/11/2023 0809   K 4.2 09/11/2023 0809   CL 103 09/11/2023 0809   CO2 29 09/11/2023 0809   GLUCOSE 109 (H) 09/11/2023 0809   BUN 13 09/11/2023 0809   CREATININE 0.95 09/11/2023 0809   CREATININE 0.92 08/13/2019 0852   CALCIUM 9.3 09/11/2023 0809   GFRNONAA >  60 05/31/2023 0600   GFRNONAA 100 08/13/2019 0852   GFRAA 116 08/13/2019 0852    BNP    Component Value Date/Time   BNP 19.0 06/24/2020 1456    ProBNP No results found for: PROBNP  Specialty Problems       Pulmonary Problems   Pleurisy   Qualifier: Diagnosis of  By: Inocencio MD, [place] LABOR       Cough   Qualifier: Diagnosis of  By: Inocencio MD, Valerie A       SOB (shortness of breath)   Allergic rhinitis   Spontaneous pneumothorax   At 48yo       Allergies  Allergen Reactions   Levofloxacin  Other (See Comments)    GI problems    Immunization History  Administered Date(s) Administered   Influenza Split 12/07/2010, 12/05/2011   Influenza,inj,Quad PF,6+ Mos 08/20/2013   Td 01/02/2006   Tdap 08/09/2018    Past Medical History:  Diagnosis Date   ACNE, CYSTIC    resolved   Arthritis    spine, early onset    Asthma    IBS (irritable bowel syndrome)    Inguinal hernia    INGUINAL HERNIA, RIGHT 04/24/2008   s/p RIH repair   Lactose intolerance    diet controlled   PONV (postoperative nausea and vomiting)    Prostatitis     Tobacco History: Social History   Tobacco Use  Smoking Status Never   Passive exposure: Never  Smokeless Tobacco Never   Counseling given: Not Answered   Continue to not smoke  Outpatient Encounter Medications as of 10/16/2023  Medication Sig   doxycycline  (VIBRA -TABS) 100 MG tablet Take 1 tablet (100 mg total) by mouth 2 (two) times daily.   fluticasone  (FLONASE ) 50 MCG/ACT nasal spray Place 1 spray into both nostrils 2 (two) times daily.   meloxicam (MOBIC) 15 MG tablet Take 15 mg by mouth daily as needed.   mesalamine  (LIALDA ) 1.2 g EC tablet TAKE 2 TABLETS BY MOUTH TWICE A DAY   oxyCODONE -acetaminophen  (PERCOCET) 5-325 MG tablet Take 1 tablet by mouth every 6 (six) hours as needed for severe pain (pain score 7-10).   Probiotic Product (PROBIOTIC PO) Take 1 capsule by mouth daily.   TURMERIC-FISH OIL PO Take 1 capsule by mouth daily.   VITAMIN D -VITAMIN K PO Take 1 tablet by mouth daily.   [DISCONTINUED] fluticasone  furoate-vilanterol (BREO ELLIPTA ) 200-25 MCG/ACT AEPB Inhale 1 puff into the lungs daily.   fluticasone  furoate-vilanterol (BREO ELLIPTA ) 200-25 MCG/ACT AEPB Inhale 1 puff into the lungs daily.   No facility-administered encounter medications on file as of 10/16/2023.     Review of Systems  Review of Systems  N/a Physical Exam  BP 138/82   Pulse 73   Ht 6' 1 (1.854 m)   Wt 176 lb 6.4 oz (80 kg)   SpO2 98%   BMI 23.27 kg/m   Wt Readings from Last 5 Encounters:  10/16/23 176 lb 6.4 oz (80 kg)  10/15/23 175 lb (79.4 kg)  09/07/23 171 lb 12.8 oz (77.9 kg)  06/29/23 170 lb (77.1 kg)  06/19/23 170 lb (77.1 kg)    BMI Readings from Last 5 Encounters:  10/16/23 23.27 kg/m  10/15/23 23.09 kg/m  09/07/23 22.67 kg/m  06/29/23  22.43 kg/m  06/19/23 22.43 kg/m     Physical Exam General: Well-appearing, no acute distress Eyes: EOMI, icterus Neck: Supple, no JVP Pulmonary: Clear, normal work of breathing Cardiovascular: Regular in rhythm, no murmur Abdomen: Distended, bowel sounds  present MSK: No synovitis, joint effusion Abdomen: Nondistended, bowel sounds present Neuro: Normal gait, no weakness Psych: Normal mood, full affect   Assessment & Plan:   Chronic cough: Present for many months to years.  Productive.  At risk for multiple etiologies including reflux given intermittent GERD symptoms worsened over the last month or 2, postnasal drip given description of nasal congestion and a sensation of mucus dripping the back of her throat.  High suspicion for chronic bronchitis related to cannabis smoke as well as asthma given chronic hyperinflation on chest imaging.  Seems improved with Breo.  Continue seasonally and then as needed.  Asthma: Based on seasonal allergies, chronic hyperinflation on serial chest imaging over the years, chronic cough.  Symptoms improved with Breo.  Continue Breo seasonally with significant symptoms, can back off to as needed thereafter.  Postnasal drip: Possible contributing to symptoms.  Flonase  with minimal improvement.  Will resume now.  Given prostate issues and ongoing workup, will avoid antihistamines and antimuscarinic agents at this time.  Can consider trialing in the future once prostate investigation is over.   Return in about 1 year (around 10/15/2024) for f/u Dr. Annella.   Donnice JONELLE Annella, MD 10/16/2023

## 2023-10-19 ENCOUNTER — Other Ambulatory Visit (INDEPENDENT_AMBULATORY_CARE_PROVIDER_SITE_OTHER)

## 2023-10-19 ENCOUNTER — Ambulatory Visit: Payer: Self-pay | Admitting: Internal Medicine

## 2023-10-19 ENCOUNTER — Other Ambulatory Visit

## 2023-10-19 DIAGNOSIS — R972 Elevated prostate specific antigen [PSA]: Secondary | ICD-10-CM | POA: Diagnosis not present

## 2023-10-19 DIAGNOSIS — Z202 Contact with and (suspected) exposure to infections with a predominantly sexual mode of transmission: Secondary | ICD-10-CM

## 2023-10-19 LAB — PSA: PSA: 3.76 ng/mL (ref 0.10–4.00)

## 2023-10-20 ENCOUNTER — Ambulatory Visit: Payer: Self-pay | Admitting: Internal Medicine

## 2023-10-22 LAB — GC/CHLAMYDIA PROBE AMP
Chlamydia trachomatis, NAA: NEGATIVE
Neisseria Gonorrhoeae by PCR: NEGATIVE

## 2023-10-22 LAB — HIV ANTIBODY (ROUTINE TESTING W REFLEX)
HIV 1&2 Ab, 4th Generation: NONREACTIVE
HIV FINAL INTERPRETATION: NEGATIVE

## 2023-10-22 LAB — HERPES SIMPLEX VIRUS 2(IGG) W/ REFLEX TO HSV2 INHIBITION: HSV 2 Glycoprotein G Ab, IgG: <0.9 {index}

## 2023-10-22 LAB — RPR: RPR Ser Ql: NONREACTIVE

## 2023-11-06 ENCOUNTER — Other Ambulatory Visit: Payer: Self-pay | Admitting: Adult Health

## 2023-11-06 DIAGNOSIS — R972 Elevated prostate specific antigen [PSA]: Secondary | ICD-10-CM

## 2023-11-07 ENCOUNTER — Other Ambulatory Visit: Payer: Self-pay | Admitting: Adult Health

## 2023-11-07 DIAGNOSIS — R972 Elevated prostate specific antigen [PSA]: Secondary | ICD-10-CM

## 2023-11-08 NOTE — Progress Notes (Signed)
 [facility] Return Visit   Referring Provider Norleen Lynwood ORN, MD [address] [place],  [place] [address]  Primary Care Provider Norleen Lynwood ORN, MD  Patient Profile: Curtis [name] is a 48 y.o. male who returns to the [facility] for follow-up of the problem(s) noted below.  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 was last seen in the GI office 10/10/2022 by Dr. Aneita   Current GI Meds  Lialda  2.4 g p.o. twice daily  Interval History   Discussed the use of AI scribe software for clinical note transcription with the patient, who gave verbal consent to proceed.  History of Present Illness MONTRAIL MEHRER [name] is a 48 year old male with a past medical history noteworthy for HLD, inguinal hernia, hydrocele, chronic perineal/pelvic pain who returns to the gastroenterology office for follow-up  Ulcerative proctosigmoiditis -- Continues on Lialda  2.4 g p.o. twice daily -- Takes probiotics and immunoglobulins to support gut health during antibiotic treatment  -- Bowel movements occur three to four times daily - formed, no diarrhea -- No straining, blood, or mucus present in stool -- High fiber diet and adequate hydration maintained -- Denies abdominal pain or cramping -- Minimal fecal urgency, no tenesmus or nocturnal bowel movements  -- Reviewed results of colonoscopy 03/2023 confirming endoscopic remission of disease.  -- Denies extraintestinal manifestations of IBD  Chronic perineal/pelvic pain/pelvic floor dysfunction -- Has chronic pelvic pain -testicular pain, scrotal pain, voiding difficulties, hydrocele and pelvic floor dysfunction -- Symptoms are improving since hydrocele surgery within the last year --  Experiences pressure and difficulty initiating bowel movements, suggestive of pelvic floor disorder -- Works with pelvic physical therapist and uses pelvic wand for strengthening exercises -- Bowel movements are normal in caliber when pelvic floor is relaxed  Hemorrhoids and hypertrophied anal papilla -- Previously documented to have grade 3 hemorrhoids -- Has been evaluated by Dr. Mabel Essex and underwent excision of left-sided hypertrophied anal papula with improvement in prolapse symptoms -- Dr. Essex felt that hemorrhoids were mild and advised medical management-no surgery indicated at this time   Gastroesophageal reflux disease (gerd) -- GERD symptoms improved with herbal supplements from [facility], including slippery elm -- Denies heartburn, waterbrash, pyrosis, dysphagia or dyne aphasia   GI Review of Symptoms Significant for intermittent fecal urgency, improved pelvic floor symptoms otherwise negative.  General Review of Systems  Review of systems is significant for the pertinent positives and negatives as listed per the HPI.  Full ROS is otherwise negative.  Inflammatory Bowel Disease History  2018 - hematochezia, change in bowel habits; Colonoscopy-ulcerative proctosigmoiditis --> tx'd w/Lialda  2.4 g BID 2025 - colonoscopy confirms endoscopic remission of disease  IBD Medication History Lialda   Past Medical History   Past Medical History:  Diagnosis Date   ACNE, CYSTIC    resolved   Arthritis    spine, early onset   Asthma    IBS (irritable bowel syndrome)    Inguinal hernia    INGUINAL HERNIA, RIGHT 04/24/2008   s/p RIH repair   Lactose intolerance    diet controlled   PONV (postoperative nausea and vomiting)    Prostatitis      Past Surgical History   Past Surgical History:  Procedure Laterality Date   COLONOSCOPY  2018   HERNIA REPAIR  Right 2010   Dr. Sebastian   HYDROCELE EXCISION Bilateral 06/29/2023   Procedure: LEFT HYRDOCELECTOMY,  RIGHT HYDROCELECTOMY;  Surgeon: Watt Rush, MD;  Location: [facility];  Service: Urology;  Laterality: Bilateral;   INGUINAL HERNIA REPAIR Bilateral 03/14/2018   Procedure: LAPAROSCOPIC BILATERAL  INGUINAL HERNIA REPAIR  WITH MESH ERAS PAHTWAY;  Surgeon: Sheldon Standing, MD;  Location: [facility];  Service: General;  Laterality: Bilateral;   SKIN TAG REMOVAL     Surgery for spont pneumothorax Right 1996   UMBILICAL HERNIA REPAIR N/A 03/14/2018   Procedure: HERNIA REPAIR UMBILICAL ADULT;  Surgeon: Sheldon Standing, MD;  Location: [facility];  Service: General;  Laterality: N/A;   WISDOM TOOTH EXTRACTION       Allergies and Medications   Allergies  Allergen Reactions   Levofloxacin  Other (See Comments)    GI problems    Current Meds  Medication Sig   fluticasone  furoate-vilanterol (BREO ELLIPTA ) 200-25 MCG/ACT AEPB Inhale 1 puff into the lungs daily.   mesalamine  (LIALDA ) 1.2 g EC tablet TAKE 2 TABLETS BY MOUTH TWICE A DAY   Probiotic Product (PROBIOTIC PO) Take 1 capsule by mouth daily.   TURMERIC-FISH OIL PO Take 1 capsule by mouth daily.   VITAMIN D -VITAMIN K PO Take 1 tablet by mouth daily.     Family History  Problem Relation Age of Onset   Urolithiasis Father        Had kidney removed at age 101   Stroke Maternal Grandmother 65   Colon cancer Neg Hx    Esophageal cancer Neg Hx    Pancreatic cancer Neg Hx    Prostate cancer Neg Hx    Rectal cancer Neg Hx    Stomach cancer Neg Hx     Social History   Social History   Tobacco Use   Smoking status: Never    Passive exposure: Never   Smokeless tobacco: Never  Vaping Use   Vaping status: Former  Substance Use Topics   Alcohol use: Not Currently    Comment: rarely   Drug use: Yes    Types: Marijuana    Comment: daily use   Lloyde reports that he has never smoked. He has never been exposed to tobacco smoke. He has never used smokeless tobacco. He reports that he does not currently use  alcohol. He reports current drug use. Drug: Marijuana.  Vital Signs and Physical Examination   Vitals:   11/09/23 1621  BP: 120/80  Pulse: 78    Body mass index is 23.17 kg/m. Weight: 175 lb 9.6 oz (79.7 kg)  General: Well developed, well nourished, no acute distress Head: Normocephalic and atraumatic Mouth: No deformities or lesions noted Lungs: Clear throughout to auscultation Heart: Regular rate and rhythm; No murmurs, rubs or bruits Abdomen: Soft, mild tenderness to palpation over left abdomen and non distended. No masses, hepatosplenomegaly or hernias noted. Normal Bowel sounds Rectal: Deferred Musculoskeletal: Symmetrical with no gross deformities     Review of Data  The following data was reviewed at the time of this encounter:  Laboratory Studies      Latest Ref Rng & Units 09/11/2023    8:09 AM 06/19/2023    1:27 PM 05/31/2023    6:00 AM  CBC  WBC 4.0 - 10.5 K/uL 8.6  10.0  13.0   Hemoglobin 13.0 - 17.0 g/dL 85.0  85.1  84.4   Hematocrit 39.0 - 52.0 % 44.3  45.0  46.5   Platelets 150.0 -  400.0 K/uL 272.0  280  267     No results found for: LIPASE    Latest Ref Rng & Units 09/11/2023    8:09 AM 05/31/2023    6:00 AM 01/25/2023    4:53 PM  CMP  Glucose 70 - 99 mg/dL 890  877  95   BUN 6 - 23 mg/dL 13  13  14    Creatinine 0.40 - 1.50 mg/dL 9.04  9.07  9.05   Sodium 135 - 145 mEq/L 139  136  141   Potassium 3.5 - 5.1 mEq/L 4.2  3.6  4.3   Chloride 96 - 112 mEq/L 103  101  103   CO2 19 - 32 mEq/L 29  28  27    Calcium 8.4 - 10.5 mg/dL 9.3  9.1  9.4   Total Protein 6.0 - 8.3 g/dL 6.9   7.1   Total Bilirubin 0.2 - 1.2 mg/dL 0.4   0.3   Alkaline Phos 39 - 117 U/L 72   80   AST 0 - 37 U/L 21   19   ALT 0 - 53 U/L 27   21    IBD Labs  Prebiologic Labs   Therapeutic Drug Monitoring  Thiopurine metabolite levels:  Date:                6-TGN       6-MMP  Biologic level and antibodies:  Fecal Calprotectin    Imaging Studies  CTAP 02/2023 1. Colonic  stool burden compatible with constipation. 2. Scattered colonic diverticulosis without findings of acute diverticulitis. 3. Partially visualized 5.8 cm fluid collection in the left hemiscrotum, possibly reflecting a hydrocele. Suggest further evaluation with scrotal ultrasound.    GI Procedures and Studies  Colonoscopy 03/06/2023 - Perianal skin tags found on perianal exam.  - Hemorrhoids found on perianal exam.  - Normal mucosa in the entire examined colon. Biopsied. Ulcerative proctosigmoiditis in endoscopic remission.  - The examined portion of the ileum was normal.  - Internal hemorrhoids.  - Anal papilla(e) were hypertrophied.  - Diverticulosis in the sigmoid colon, in the descending colon and in the transverse colon. -  Biopsies for surveillance were taken from the rectosigmoid colon.   Path: Benign colonic mucosa in the right colon, left colon and rectosigmoid colon  Colonoscopy 05/2016 Diffuse moderate inflammation in the rectosigmoid colon, o/w normal Path: Chronic active colitis in rectosigmoid colon, remainder of colon biopsies normal  Colonoscopy 04/2013 Normal colon and terminal ileum   Clinical Impression  It is my clinical impression that Mr. Bollen is a 48 y.o. 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 Luu presents to the office today for follow-up of multiple gastrointestinal issues as outlined above.    With regard to his history of ulcerative proctosigmoiditis, colonoscopy 03/2023 confirmed endoscopic remission of disease on Lialda  2.4 g p.o. twice daily.  We reviewed that the discomfort he has experienced in his perineal pelvic floor region is not necessarily attributable to his IBD given that no inflammation is currently present.  At today's visit we discussed whether or not he  could dose reduce his mesalamine .  Given that he does continue to experience ongoing pelvic floor issues and comfort, I would be inclined to continue his current dose of medication.  If he were to flare, I would be concerned that the combination of his underlying pelvic floor  issues and IBD flare could result in significant pain and discomfort.  We can reevaluate possible dose resection of his mesalamine  in the future when his pelvic floor issues improve.  Lenox has engaged in pelvic floor physical therapy which has been beneficial and is of improving his bowel function.  He is using a pelvic floor wand that has helped with pelvic floor relaxation and issues related to defecation.  He underwent repair of a hydrocele within the last year which is also yielded improvement of his perineal pain.  Dr. Lang excised hypertrophied anal papilla which also rectified his issues of prolapse.  GERD is currently well-managed on a combination of herbal supplements and probiotic from [facility].  Plan  Continue Lialda  2.4 g p.o. twice daily Mesalamine  monitoring labs annually: CBC, CMP, ESR, CRP, vitamin D , currently up-to-date Continue pelvic floor physical therapy Continue limiting lactose in diet Continue OTC supplements/bowel biotic from [facility] for management of GERD Continue care with Dr. Watt regarding painful pelvic floor syndrome  IBD Health Maintenance  Vaccinations Influenza:   11/09/23 -patient reports that he declines vaccines PCV 13: PCV15: PCV 20: PCV 21: PPSV23: COVID19: HAV: HBV:  Shingles: HPV: Tdap:  DEXA As needed  Eye Exam As needed  Skin Exam N/A  Surveillance Colonoscopy Due 2028  Tobacco Use None  Depression Screen      Planned Follow Up  1 year  The patient or caregiver verbalized understanding of the material covered, with no barriers to understanding. All questions were answered. Patient or caregiver is agreeable with the plan outlined above.    It was a  pleasure to see Arjen.  If you have any questions or concerns regarding this evaluation, do not hesitate to contact me.  Inocente Hausen, MD [facility]   I spent total of 30 minutes in both face-to-face (20 minutes interview) and non-face-to-face (10 minutes chart review, care coordination, documentation)  activities, excluding procedures performed, for the visit on the date of this encounter.

## 2023-11-09 ENCOUNTER — Encounter: Payer: Self-pay | Admitting: Pediatrics

## 2023-11-09 ENCOUNTER — Ambulatory Visit: Admitting: Pediatrics

## 2023-11-09 VITALS — BP 120/80 | HR 78 | Ht 73.0 in | Wt 175.6 lb

## 2023-11-09 DIAGNOSIS — E739 Lactose intolerance, unspecified: Secondary | ICD-10-CM | POA: Diagnosis not present

## 2023-11-09 DIAGNOSIS — K219 Gastro-esophageal reflux disease without esophagitis: Secondary | ICD-10-CM | POA: Diagnosis not present

## 2023-11-09 DIAGNOSIS — K580 Irritable bowel syndrome with diarrhea: Secondary | ICD-10-CM

## 2023-11-09 DIAGNOSIS — K5130 Ulcerative (chronic) rectosigmoiditis without complications: Secondary | ICD-10-CM

## 2023-11-09 DIAGNOSIS — M6289 Other specified disorders of muscle: Secondary | ICD-10-CM

## 2023-11-09 NOTE — Patient Instructions (Signed)
 Follow up in 1 year or sooner if needed.  Thank you for entrusting me with your care and for choosing [facility], Dr. Inocente Hausen  _______________________________________________________  If your blood pressure at your visit was 140/90 or greater, please contact your primary care physician to follow up on this.  _______________________________________________________  If you are age 49 or older, your body mass index should be between 23-30. Your Body mass index is 23.17 kg/m. If this is out of the aforementioned range listed, please consider follow up with your Primary Care Provider.  If you are age 67 or younger, your body mass index should be between 19-25. Your Body mass index is 23.17 kg/m. If this is out of the aformentioned range listed, please consider follow up with your Primary Care Provider.   ________________________________________________________  The [facility] providers would like to encourage you to use MYCHART to communicate with providers for non-urgent requests or questions.  Due to long hold times on the telephone, sending your provider a message by [facility] may be a faster and more efficient way to get a response.  Please allow 48 business hours for a response.  Please remember that this is for non-urgent requests.  _______________________________________________________  [facility] is using a team-based approach to care.  Your team is made up of your doctor and two to three APPS. Our APPS (Nurse Practitioners and Physician Assistants) work with your physician to ensure care continuity for you. They are fully qualified to address your health concerns and develop a treatment plan. They communicate directly with your gastroenterologist to care for you. Seeing the Advanced Practice Practitioners on your physician's team can help you by facilitating care more promptly, often allowing for earlier appointments, access to diagnostic testing, procedures, and  other specialty referrals.

## 2023-11-13 ENCOUNTER — Ambulatory Visit: Admitting: Pulmonary Disease

## 2023-12-05 ENCOUNTER — Encounter: Payer: Self-pay | Admitting: Adult Health

## 2023-12-18 ENCOUNTER — Inpatient Hospital Stay: Admission: RE | Admit: 2023-12-18

## 2023-12-18 ENCOUNTER — Other Ambulatory Visit: Payer: Self-pay | Admitting: Pediatrics

## 2024-01-13 ENCOUNTER — Other Ambulatory Visit: Payer: Self-pay | Admitting: Pulmonary Disease

## 2024-01-20 ENCOUNTER — Other Ambulatory Visit
